# Patient Record
Sex: Female | Born: 1940 | Race: White | Hispanic: No | Smoking: Former smoker
Health system: Southern US, Community
[De-identification: ages and names within clinical notes are randomized; demographics above are authoritative.]

## PROBLEM LIST (undated history)

## (undated) DIAGNOSIS — M419 Scoliosis, unspecified: Secondary | ICD-10-CM

## (undated) DIAGNOSIS — G629 Polyneuropathy, unspecified: Secondary | ICD-10-CM

## (undated) DIAGNOSIS — Z9889 Other specified postprocedural states: Secondary | ICD-10-CM

## (undated) DIAGNOSIS — C73 Malignant neoplasm of thyroid gland: Secondary | ICD-10-CM

## (undated) DIAGNOSIS — F329 Major depressive disorder, single episode, unspecified: Secondary | ICD-10-CM

## (undated) DIAGNOSIS — IMO0001 Reserved for inherently not codable concepts without codable children: Secondary | ICD-10-CM

## (undated) DIAGNOSIS — E785 Hyperlipidemia, unspecified: Secondary | ICD-10-CM

## (undated) DIAGNOSIS — C801 Malignant (primary) neoplasm, unspecified: Secondary | ICD-10-CM

## (undated) DIAGNOSIS — M810 Age-related osteoporosis without current pathological fracture: Secondary | ICD-10-CM

## (undated) DIAGNOSIS — F32A Depression, unspecified: Secondary | ICD-10-CM

## (undated) DIAGNOSIS — E039 Hypothyroidism, unspecified: Secondary | ICD-10-CM

## (undated) DIAGNOSIS — I10 Essential (primary) hypertension: Secondary | ICD-10-CM

## (undated) DIAGNOSIS — G43909 Migraine, unspecified, not intractable, without status migrainosus: Secondary | ICD-10-CM

## (undated) DIAGNOSIS — G4700 Insomnia, unspecified: Secondary | ICD-10-CM

## (undated) DIAGNOSIS — M1990 Unspecified osteoarthritis, unspecified site: Secondary | ICD-10-CM

## (undated) HISTORY — DX: Scoliosis, unspecified: M41.9

## (undated) HISTORY — PX: TONSILLECTOMY: SUR1361

## (undated) HISTORY — PX: HARDWARE REMOVAL: SHX979

## (undated) HISTORY — DX: Depression, unspecified: F32.A

## (undated) HISTORY — DX: Insomnia, unspecified: G47.00

## (undated) HISTORY — DX: Major depressive disorder, single episode, unspecified: F32.9

## (undated) HISTORY — PX: CARPAL TUNNEL RELEASE: SHX101

## (undated) HISTORY — PX: OTHER SURGICAL HISTORY: SHX169

## (undated) HISTORY — DX: Malignant (primary) neoplasm, unspecified: C80.1

## (undated) HISTORY — DX: Hypothyroidism, unspecified: E03.9

## (undated) HISTORY — PX: CHOLECYSTECTOMY: SHX55

## (undated) HISTORY — DX: Migraine, unspecified, not intractable, without status migrainosus: G43.909

## (undated) HISTORY — PX: CATARACT EXTRACTION: SUR2

## (undated) HISTORY — DX: Hyperlipidemia, unspecified: E78.5

## (undated) HISTORY — DX: Malignant neoplasm of thyroid gland: C73

## (undated) HISTORY — PX: THYROIDECTOMY: SHX17

## (undated) HISTORY — DX: Reserved for inherently not codable concepts without codable children: IMO0001

## (undated) HISTORY — DX: Age-related osteoporosis without current pathological fracture: M81.0

## (undated) HISTORY — DX: Polyneuropathy, unspecified: G62.9

---

## 1968-04-13 DIAGNOSIS — C73 Malignant neoplasm of thyroid gland: Secondary | ICD-10-CM

## 1968-04-13 HISTORY — DX: Malignant neoplasm of thyroid gland: C73

## 2010-02-24 ENCOUNTER — Encounter: Admission: RE | Admit: 2010-02-24 | Discharge: 2010-02-24 | Payer: Self-pay | Admitting: Gastroenterology

## 2010-04-18 ENCOUNTER — Other Ambulatory Visit
Admission: RE | Admit: 2010-04-18 | Discharge: 2010-04-18 | Payer: Self-pay | Source: Home / Self Care | Admitting: Family Medicine

## 2010-10-19 ENCOUNTER — Observation Stay (HOSPITAL_COMMUNITY)
Admission: EM | Admit: 2010-10-19 | Discharge: 2010-10-20 | Disposition: A | Discharge status: Home / Self Care | Payer: Medicare Other | Attending: Internal Medicine | Admitting: Internal Medicine

## 2010-10-19 ENCOUNTER — Inpatient Hospital Stay (HOSPITAL_COMMUNITY): Payer: Medicare Other

## 2010-10-19 DIAGNOSIS — E039 Hypothyroidism, unspecified: Secondary | ICD-10-CM | POA: Insufficient documentation

## 2010-10-19 DIAGNOSIS — K589 Irritable bowel syndrome without diarrhea: Secondary | ICD-10-CM | POA: Insufficient documentation

## 2010-10-19 DIAGNOSIS — E860 Dehydration: Principal | ICD-10-CM | POA: Insufficient documentation

## 2010-10-19 DIAGNOSIS — W19XXXA Unspecified fall, initial encounter: Secondary | ICD-10-CM | POA: Insufficient documentation

## 2010-10-19 DIAGNOSIS — E785 Hyperlipidemia, unspecified: Secondary | ICD-10-CM | POA: Insufficient documentation

## 2010-10-19 DIAGNOSIS — N179 Acute kidney failure, unspecified: Secondary | ICD-10-CM | POA: Insufficient documentation

## 2010-10-19 DIAGNOSIS — Y9229 Other specified public building as the place of occurrence of the external cause: Secondary | ICD-10-CM | POA: Insufficient documentation

## 2010-10-19 DIAGNOSIS — R42 Dizziness and giddiness: Secondary | ICD-10-CM | POA: Insufficient documentation

## 2010-10-19 DIAGNOSIS — S01501A Unspecified open wound of lip, initial encounter: Secondary | ICD-10-CM | POA: Insufficient documentation

## 2010-10-19 DIAGNOSIS — R110 Nausea: Secondary | ICD-10-CM | POA: Insufficient documentation

## 2010-10-19 DIAGNOSIS — R55 Syncope and collapse: Secondary | ICD-10-CM | POA: Insufficient documentation

## 2010-10-19 DIAGNOSIS — R197 Diarrhea, unspecified: Secondary | ICD-10-CM | POA: Insufficient documentation

## 2010-10-19 DIAGNOSIS — Z79899 Other long term (current) drug therapy: Secondary | ICD-10-CM | POA: Insufficient documentation

## 2010-10-19 LAB — POCT I-STAT, CHEM 8
BUN: 27 mg/dL — ABNORMAL HIGH (ref 6–23)
Calcium, Ion: 1.27 mmol/L (ref 1.12–1.32)
Chloride: 108 meq/L (ref 96–112)
Creatinine, Ser: 1.4 mg/dL — ABNORMAL HIGH (ref 0.50–1.10)
Glucose, Bld: 97 mg/dL (ref 70–99)
HCT: 41 % (ref 36.0–46.0)
Hemoglobin: 13.9 g/dL (ref 12.0–15.0)
Potassium: 3.7 meq/L (ref 3.5–5.1)
Sodium: 142 meq/L (ref 135–145)
TCO2: 23 mmol/L (ref 0–100)

## 2010-10-19 LAB — CK TOTAL AND CKMB (NOT AT ARMC)
CK, MB: 1.8 ng/mL (ref 0.3–4.0)
Relative Index: INVALID (ref 0.0–2.5)
Total CK: 61 U/L (ref 7–177)

## 2010-10-19 LAB — TROPONIN I: Troponin I: <0.3 ng/mL

## 2010-10-20 ENCOUNTER — Inpatient Hospital Stay (HOSPITAL_COMMUNITY): Payer: Medicare Other

## 2010-10-20 DIAGNOSIS — R55 Syncope and collapse: Secondary | ICD-10-CM

## 2010-10-20 LAB — BASIC METABOLIC PANEL
CO2: 24 mEq/L (ref 19–32)
Chloride: 110 mEq/L (ref 96–112)
Glucose, Bld: 92 mg/dL (ref 70–99)
Sodium: 141 mEq/L (ref 135–145)

## 2010-10-20 LAB — CARDIAC PANEL(CRET KIN+CKTOT+MB+TROPI)
CK, MB: 2.1 ng/mL (ref 0.3–4.0)
Relative Index: INVALID (ref 0.0–2.5)
Relative Index: INVALID (ref 0.0–2.5)
Total CK: 68 U/L (ref 7–177)
Total CK: 72 U/L (ref 7–177)

## 2010-10-20 LAB — CBC
Hemoglobin: 11.8 g/dL — ABNORMAL LOW (ref 12.0–15.0)
MCH: 31 pg (ref 26.0–34.0)
MCHC: 34 g/dL (ref 30.0–36.0)
MCV: 91.1 fL (ref 78.0–100.0)
Platelets: 167 10*3/uL (ref 150–400)
RBC: 3.81 MIL/uL — ABNORMAL LOW (ref 3.87–5.11)

## 2010-10-20 MED ORDER — IOHEXOL 300 MG/ML  SOLN
100.0000 mL | Freq: Once | INTRAMUSCULAR | Status: AC | PRN
  Administered 2010-10-20: 100 mL via INTRAVENOUS

## 2010-10-21 LAB — FECAL LACTOFERRIN, QUANT: Fecal Lactoferrin: NEGATIVE

## 2010-10-21 LAB — GIARDIA/CRYPTOSPORIDIUM SCREEN(EIA)

## 2010-10-21 LAB — GRAM STAIN

## 2010-10-29 NOTE — Discharge Summary (Signed)
Jacqueline Nash, Jacqueline Nash             ACCOUNT NO.:  [id number]  MEDICAL RECORD NO.:  [id number]  LOCATION:  4709                         FACILITY:  [facility]  PHYSICIAN:  Thad Ranger, MD       DATE OF BIRTH:  1941-02-13  DATE OF ADMISSION:  10/19/2010 DATE OF DISCHARGE:  10/20/2010                        DISCHARGE SUMMARY - REFERRING   PRIMARY CARE PHYSICIAN:  Ancil Boozer, MD  DISCHARGE DIAGNOSES: 1. Syncopal episode, most likely precipitated due to dehydration from     diarrhea. 2. Acute kidney injury due to dehydration. 3. Diarrhea, resolved. 4. Hypothyroidism. 5. Positive D-dimer, CTA negative for pulmonary embolism.  DISCHARGE MEDICATIONS: 1. Pravachol 10 mg p.o. nightly. 2. Mobic 7.5 mg p.o. daily. 3. Synthroid 75 mcg p.o. q.a.m. 4. Lyrica 300 mg nightly. 5. Niacin 250 mg q.a.m. 6. Zoloft 100 mg q.a.m.  BRIEF HISTORY OF PRESENT ILLNESS AT THE TIME OF ADMISSION:  Jacqueline Nash is a 70 year old female who presented to the [facility] Emergency Room with a syncopal episode at the church on the day of admission.  The patient had stated that she was at the church on the morning and was standing in the church most of the time and around 11 a.m. she started having blurry vision and felt nauseous.  Denied any vomiting, however, had dizziness, lightheadedness, and then syncopal episode.  The patient had no recollection of the events during the syncopal episode.  She did hit the floor and has a laceration in the lower left lip for which she received stitches in the emergency room as well as right knee abrasion.  Also, the patient gave a history of irritable bowel syndrome and has off and on diarrhea for the last 1 week.  The patient had loose bowel movements most of the week.  She follows Dr. Randa Evens for her irritable bowel syndrome from the Gastroenterology.  The patient was admitted for observation due to borderline hypotension with lowest blood pressure of 85/50 in  the emergency room.  RADIOLOGICAL DATA:  CT of head without contrast on October 19, 2010, atrophy with no acute intracranial abnormality.  CT angio of the chest, no pulmonary embolus, 2 scattered tiny pulmonary nodules.  Followup chest CT, no pneumonia.  The pulmonary nodules are known to the patient. Patchy bilateral ground glass, my be due to edema.  Low-attenuation lesion in the pancreatic tail with possible enlargement from February 24, 2010.  Follow up in 6-12 months with MRI of abdomen with and without contrast preferred or CT without and with contrast.  Echo, EF of 55-65%, normal wall motion, no regional wall motion abnormality.  PERTINENT LABORATORY AND DIAGNOSTIC DATA:  Sodium at the time of admission 142, potassium 3.7, BUN 27, and creatinine 1.4.  Troponins remained negative for any acute ACS.  D-dimer positive at 1.65.  TSH 1.2.  Stools studies showed no WBCs.  Gram stain and culture was negative.  BRIEF HOSPITALIZATION COURSE:  Jacqueline Nash is a 70 year old female who was admitted with syncopal episode and hypotension. 1. Syncope, most likely perhaps due to dehydration from her ongoing     diarrhea for the last 1 week.  The patient was admitted for     observation to the  tele floor given the hypotension in the     emergency room.  She was hydrated with IV fluids.  She did have     mild acute kidney injury with the creatinine of 1.4 at the time of     admission.  Two-D echo was negative for any regional wall motion     abnormalities.  D-dimer was elevated, hence a CT angiogram of the     chest was obtained which was negative for PE.  CT of head was     negative for any stroke. 2. Acute kidney injury with unknown baseline creatinine, likely due to     dehydration from ongoing diarrhea.  The patient was gently hydrated     and creatinine function normalized at the time of the discharge. 3. Diarrhea, likely secondary to irritable bowel syndrome.  The     patient had no  diarrhea during the hospitalization.  Stool studies     were negative.  She will follow up with the gastroenterologist, Dr.     Randa Evens. 4. Hypothyroidism.  The patient was continued on Synthroid.  She was     discharged to home on October 20, 2010.  DISCHARGE FOLLOWUP:  With Dr. Ancil Boozer within 7-10 days for hospital followup.  TIME SPENT:  30 minutes.     Thad Ranger, MD     RR/MEDQ  D:  10/21/2010  T:  10/21/2010  Job:  [id number]  cc:   Ancil Boozer, MD [facility] Malon Kindle., M.D.  Electronically Signed by Andres Labrum Zaire Vanbuskirk  on 10/29/2010 05:46:46 PM

## 2010-10-29 NOTE — H&P (Signed)
NAMEMarland Kitchen  Nash, Jacqueline NO.:  [id number]  MEDICAL RECORD NO.:  [id number]  LOCATION:  [facility]                         FACILITY:  [facility]  PHYSICIAN:  Thad Ranger, MD       DATE OF BIRTH:  1940/11/24  DATE OF ADMISSION:  10/19/2010 DATE OF DISCHARGE:                             HISTORY & PHYSICAL   PRIMARY CARE PHYSICIAN:  Ancil Boozer, MD, [facility].  CHIEF COMPLAINT:  Syncopal episode.  HISTORY OF PRESENT ILLNESS:  Jacqueline Nash is a 70 year old female with arthritis, history of hyperlipidemia, hypothyroidism, irritable bowel syndrome who presented to the [facility] Emergency Room with a syncopal episode today at the church.  History was provided by the patient, who stated that she was at the church this morning and she was standing in the church most of the time.  Around 11 a.m., she started having blurry vision and then felt nauseous.  She did not have any vomiting, however, she did have dizziness and lightheadedness and then syncopal episode. The patient had no recollection of the event during the syncopal episode.  She did hit the floor and had a laceration below her lower lip, for which she received stitches in the emergency room as well as right knee abrasion.  The patient states that she did not have any prior syncopal episodes and no chest pain or any palpitations, shortness of breath, or any seizure-like activity witnessed by other people in the church.  The patient has a history of irritable bowel syndrome and states that she has off and on diarrhea for last 1 week.  The patient has been having loose bowel movement most of the week.  She follows Dr. Randa Evens for her irritable bowel syndrome from [facility].  In the emergency room, the patient was noticed to have borderline hypotension with the lowest blood pressure of 85/50, however, the patient had lowest recordable blood pressure in 60s via the EMS.   The patient has received about 1-1/2 L in the emergency room.  She is admitted under observation for further workup.  REVIEW OF SYSTEMS:  Pertinent positives are dictated above.  PAST MEDICAL HISTORY: 1. Arthritis. 2. Chronic back pain. 3. Hyperlipidemia. 4. Hypothyroidism. 5. Irritable bowel syndrome. 6. History of scoliosis.  SOCIAL HISTORY:  The patient denies any smoking, alcohol, or any drug use.  She currently lives at home and is functional with all her ADLs.  ALLERGIES:  DEMEROL AND SULFA DRUGS.  PAST SURGICAL HISTORY:  Thyroidectomy.  MEDICATIONS:  Prior to admission, 1. Zoloft 100 mg q.a.m. 2. Lyrica 300 mg at bedtime. 3. Mobic 7.5 mg daily. 4. Synthroid 75 mcg p.o. q.a.m.  PHYSICAL EXAMINATION:  VITAL SIGNS:  At the time of dictation, BP 93/65, pulse rate 55, respirations 20, temperature 98.2. GENERAL:  The patient is alert, awake and oriented, very pleasant and cooperative, not in any acute distress. HEENT:  Anicteric sclerae.  Pale conjunctivae.  Pupils reactive to light and accommodation.  EOMI.  Laceration below the lower lip, stitches placed. NECK:  Supple.  No lymphadenopathy.  No JVD.  Dry mucosal membranes. CVS:  S1 and S2 clear. CHEST:  Clear to auscultation bilaterally. ABDOMEN:  Soft,  nontender, nondistended.  Normal bowel sounds. EXTREMITIES:  No cyanosis, clubbing, or edema noted in upper or lower extremities bilaterally.  Abrasion on the right lower extremity with dressing intact. NEURO:  No focal neurological deficits noted.  LABORATORY DATA:  Sodium 142, potassium 3.7, BUN 27, creatinine 1.4, baseline creatinine unknown.  Hemoglobin 13.9, hematocrit 41.0, CK 61. Troponin less than 0.3.  RADIOLOGICAL DATA:  None available.  IMPRESSION AND PLAN:  Jacqueline Nash is a 70 year old female with stable medical problems including hyperlipidemia, hypothyroidism, irritable bowel syndrome, presented with syncopal episode today. 1. Syncope, most likely  precipitated due to dehydration from ongoing     diarrhea for last 1 week.  The patient will be admitted for     observation to the monitored floor given the hypotension.  She will     be gently hydrated with IV fluids.  Obtain cardiac enzymes to rule     out any ACS.  Obtain EKG, 2-D echo, and carotid Doppler for further     workup.  Given that the patient did fall on the floor hitting her     face, I would obtain a CT head.  Obtain D-dimer. 2. Acute kidney injury with unknown baseline creatinine.  Likely due     to dehydration.  The patient will be gently hydrated.  If     creatinine continues to worsen, will obtain renal ultrasound.  I     will hold Mobic for now. 3. Diarrhea, likely secondary to the irritable bowel syndrome.  The     patient denies taking any antibiotics in the last 3 months.  I will     place her on low-residue diet and also obtain stool studies for     further workup.  Currently, no indication for starting any     antibiotics. 4. Hypothyroidism.  Obtain TSH and continue Synthroid. 5. Arthritis.  Continue Lyrica and Tylenol p.r.n. 6. Prophylaxis.  Bilateral SCDs for DVT prophylaxis.     Thad Ranger, MD    RR/MEDQ  D:  10/19/2010  T:  10/19/2010  Job:  [id number]  cc:   Fayrene Fearing L. Malon Kindle., M.D. Ancil Boozer, MD  Electronically Signed by Andres Labrum RAI  on 10/29/2010 05:47:03 PM

## 2011-04-22 DIAGNOSIS — S239XXA Sprain of unspecified parts of thorax, initial encounter: Secondary | ICD-10-CM | POA: Diagnosis not present

## 2011-04-22 DIAGNOSIS — M47814 Spondylosis without myelopathy or radiculopathy, thoracic region: Secondary | ICD-10-CM | POA: Diagnosis not present

## 2011-04-22 DIAGNOSIS — M9981 Other biomechanical lesions of cervical region: Secondary | ICD-10-CM | POA: Diagnosis not present

## 2011-04-22 DIAGNOSIS — M999 Biomechanical lesion, unspecified: Secondary | ICD-10-CM | POA: Diagnosis not present

## 2011-04-22 DIAGNOSIS — S139XXA Sprain of joints and ligaments of unspecified parts of neck, initial encounter: Secondary | ICD-10-CM | POA: Diagnosis not present

## 2011-04-22 DIAGNOSIS — S335XXA Sprain of ligaments of lumbar spine, initial encounter: Secondary | ICD-10-CM | POA: Diagnosis not present

## 2011-04-23 DIAGNOSIS — Z0000 Encounter for general adult medical examination without abnormal findings: Secondary | ICD-10-CM | POA: Diagnosis not present

## 2011-04-23 DIAGNOSIS — E785 Hyperlipidemia, unspecified: Secondary | ICD-10-CM | POA: Diagnosis not present

## 2011-04-23 DIAGNOSIS — E039 Hypothyroidism, unspecified: Secondary | ICD-10-CM | POA: Diagnosis not present

## 2011-05-01 DIAGNOSIS — F321 Major depressive disorder, single episode, moderate: Secondary | ICD-10-CM | POA: Diagnosis not present

## 2011-06-02 ENCOUNTER — Other Ambulatory Visit: Payer: Self-pay | Admitting: Gastroenterology

## 2011-06-02 DIAGNOSIS — R932 Abnormal findings on diagnostic imaging of liver and biliary tract: Secondary | ICD-10-CM | POA: Diagnosis not present

## 2011-06-02 DIAGNOSIS — R197 Diarrhea, unspecified: Secondary | ICD-10-CM | POA: Diagnosis not present

## 2011-06-10 ENCOUNTER — Ambulatory Visit
Admission: RE | Admit: 2011-06-10 | Discharge: 2011-06-10 | Disposition: A | Discharge status: Home / Self Care | Payer: Medicare Other | Source: Ambulatory Visit | Attending: Gastroenterology | Admitting: Gastroenterology

## 2011-06-10 DIAGNOSIS — R197 Diarrhea, unspecified: Secondary | ICD-10-CM | POA: Diagnosis not present

## 2011-06-10 DIAGNOSIS — K863 Pseudocyst of pancreas: Secondary | ICD-10-CM | POA: Diagnosis not present

## 2011-06-10 DIAGNOSIS — K862 Cyst of pancreas: Secondary | ICD-10-CM | POA: Diagnosis not present

## 2011-06-10 DIAGNOSIS — R932 Abnormal findings on diagnostic imaging of liver and biliary tract: Secondary | ICD-10-CM

## 2011-06-10 DIAGNOSIS — K5900 Constipation, unspecified: Secondary | ICD-10-CM | POA: Diagnosis not present

## 2011-06-10 MED ORDER — GADOBENATE DIMEGLUMINE 529 MG/ML IV SOLN
13.0000 mL | Freq: Once | INTRAVENOUS | Status: AC | PRN
  Administered 2011-06-10: 13 mL via INTRAVENOUS

## 2011-06-18 DIAGNOSIS — E039 Hypothyroidism, unspecified: Secondary | ICD-10-CM | POA: Diagnosis not present

## 2011-07-21 DIAGNOSIS — M9981 Other biomechanical lesions of cervical region: Secondary | ICD-10-CM | POA: Diagnosis not present

## 2011-07-21 DIAGNOSIS — M47814 Spondylosis without myelopathy or radiculopathy, thoracic region: Secondary | ICD-10-CM | POA: Diagnosis not present

## 2011-07-21 DIAGNOSIS — S239XXA Sprain of unspecified parts of thorax, initial encounter: Secondary | ICD-10-CM | POA: Diagnosis not present

## 2011-07-21 DIAGNOSIS — S139XXA Sprain of joints and ligaments of unspecified parts of neck, initial encounter: Secondary | ICD-10-CM | POA: Diagnosis not present

## 2011-07-21 DIAGNOSIS — M999 Biomechanical lesion, unspecified: Secondary | ICD-10-CM | POA: Diagnosis not present

## 2011-08-07 DIAGNOSIS — H524 Presbyopia: Secondary | ICD-10-CM | POA: Diagnosis not present

## 2011-08-07 DIAGNOSIS — H43819 Vitreous degeneration, unspecified eye: Secondary | ICD-10-CM | POA: Diagnosis not present

## 2011-08-07 DIAGNOSIS — H538 Other visual disturbances: Secondary | ICD-10-CM | POA: Diagnosis not present

## 2011-08-07 DIAGNOSIS — H35379 Puckering of macula, unspecified eye: Secondary | ICD-10-CM | POA: Diagnosis not present

## 2011-08-07 DIAGNOSIS — H353190 Nonexudative age-related macular degeneration, unspecified eye, stage unspecified: Secondary | ICD-10-CM | POA: Diagnosis not present

## 2011-08-07 DIAGNOSIS — H2510 Age-related nuclear cataract, unspecified eye: Secondary | ICD-10-CM | POA: Diagnosis not present

## 2011-08-18 DIAGNOSIS — S139XXA Sprain of joints and ligaments of unspecified parts of neck, initial encounter: Secondary | ICD-10-CM | POA: Diagnosis not present

## 2011-08-18 DIAGNOSIS — S239XXA Sprain of unspecified parts of thorax, initial encounter: Secondary | ICD-10-CM | POA: Diagnosis not present

## 2011-08-18 DIAGNOSIS — M999 Biomechanical lesion, unspecified: Secondary | ICD-10-CM | POA: Diagnosis not present

## 2011-08-18 DIAGNOSIS — M47814 Spondylosis without myelopathy or radiculopathy, thoracic region: Secondary | ICD-10-CM | POA: Diagnosis not present

## 2011-08-18 DIAGNOSIS — M9981 Other biomechanical lesions of cervical region: Secondary | ICD-10-CM | POA: Diagnosis not present

## 2011-08-31 DIAGNOSIS — R932 Abnormal findings on diagnostic imaging of liver and biliary tract: Secondary | ICD-10-CM | POA: Diagnosis not present

## 2011-08-31 DIAGNOSIS — K589 Irritable bowel syndrome without diarrhea: Secondary | ICD-10-CM | POA: Diagnosis not present

## 2011-09-08 ENCOUNTER — Other Ambulatory Visit: Payer: Self-pay | Admitting: Family Medicine

## 2011-09-08 ENCOUNTER — Ambulatory Visit
Admission: RE | Admit: 2011-09-08 | Discharge: 2011-09-08 | Disposition: A | Discharge status: Home / Self Care | Payer: Medicare Other | Source: Ambulatory Visit | Attending: Family Medicine | Admitting: Family Medicine

## 2011-09-08 DIAGNOSIS — G609 Hereditary and idiopathic neuropathy, unspecified: Secondary | ICD-10-CM | POA: Diagnosis not present

## 2011-09-08 DIAGNOSIS — M19079 Primary osteoarthritis, unspecified ankle and foot: Secondary | ICD-10-CM | POA: Diagnosis not present

## 2011-09-08 DIAGNOSIS — M79609 Pain in unspecified limb: Secondary | ICD-10-CM

## 2011-09-08 DIAGNOSIS — G43909 Migraine, unspecified, not intractable, without status migrainosus: Secondary | ICD-10-CM | POA: Diagnosis not present

## 2011-09-08 DIAGNOSIS — E039 Hypothyroidism, unspecified: Secondary | ICD-10-CM | POA: Diagnosis not present

## 2011-09-08 DIAGNOSIS — F3342 Major depressive disorder, recurrent, in full remission: Secondary | ICD-10-CM | POA: Diagnosis not present

## 2011-09-08 DIAGNOSIS — J984 Other disorders of lung: Secondary | ICD-10-CM | POA: Diagnosis not present

## 2011-09-10 ENCOUNTER — Other Ambulatory Visit: Payer: Self-pay | Admitting: Family Medicine

## 2011-09-10 DIAGNOSIS — R911 Solitary pulmonary nodule: Secondary | ICD-10-CM

## 2011-09-11 DIAGNOSIS — L259 Unspecified contact dermatitis, unspecified cause: Secondary | ICD-10-CM | POA: Diagnosis not present

## 2011-09-11 DIAGNOSIS — L578 Other skin changes due to chronic exposure to nonionizing radiation: Secondary | ICD-10-CM | POA: Diagnosis not present

## 2011-09-15 DIAGNOSIS — H269 Unspecified cataract: Secondary | ICD-10-CM | POA: Diagnosis not present

## 2011-09-15 DIAGNOSIS — H2510 Age-related nuclear cataract, unspecified eye: Secondary | ICD-10-CM | POA: Diagnosis not present

## 2011-09-21 DIAGNOSIS — M79609 Pain in unspecified limb: Secondary | ICD-10-CM | POA: Diagnosis not present

## 2011-09-22 DIAGNOSIS — M47814 Spondylosis without myelopathy or radiculopathy, thoracic region: Secondary | ICD-10-CM | POA: Diagnosis not present

## 2011-09-22 DIAGNOSIS — S239XXA Sprain of unspecified parts of thorax, initial encounter: Secondary | ICD-10-CM | POA: Diagnosis not present

## 2011-09-22 DIAGNOSIS — S139XXA Sprain of joints and ligaments of unspecified parts of neck, initial encounter: Secondary | ICD-10-CM | POA: Diagnosis not present

## 2011-09-22 DIAGNOSIS — M999 Biomechanical lesion, unspecified: Secondary | ICD-10-CM | POA: Diagnosis not present

## 2011-09-22 DIAGNOSIS — M9981 Other biomechanical lesions of cervical region: Secondary | ICD-10-CM | POA: Diagnosis not present

## 2011-09-24 DIAGNOSIS — M79609 Pain in unspecified limb: Secondary | ICD-10-CM | POA: Diagnosis not present

## 2011-09-29 DIAGNOSIS — M79609 Pain in unspecified limb: Secondary | ICD-10-CM | POA: Diagnosis not present

## 2011-10-07 DIAGNOSIS — M79609 Pain in unspecified limb: Secondary | ICD-10-CM | POA: Diagnosis not present

## 2011-10-08 DIAGNOSIS — M79609 Pain in unspecified limb: Secondary | ICD-10-CM | POA: Diagnosis not present

## 2011-10-12 DIAGNOSIS — M79609 Pain in unspecified limb: Secondary | ICD-10-CM | POA: Diagnosis not present

## 2011-10-12 DIAGNOSIS — H2510 Age-related nuclear cataract, unspecified eye: Secondary | ICD-10-CM | POA: Diagnosis not present

## 2011-10-12 DIAGNOSIS — H35379 Puckering of macula, unspecified eye: Secondary | ICD-10-CM | POA: Diagnosis not present

## 2011-10-13 DIAGNOSIS — M79609 Pain in unspecified limb: Secondary | ICD-10-CM | POA: Diagnosis not present

## 2011-10-19 DIAGNOSIS — F321 Major depressive disorder, single episode, moderate: Secondary | ICD-10-CM | POA: Diagnosis not present

## 2011-10-19 DIAGNOSIS — M79609 Pain in unspecified limb: Secondary | ICD-10-CM | POA: Diagnosis not present

## 2011-10-20 DIAGNOSIS — M9981 Other biomechanical lesions of cervical region: Secondary | ICD-10-CM | POA: Diagnosis not present

## 2011-10-20 DIAGNOSIS — S239XXA Sprain of unspecified parts of thorax, initial encounter: Secondary | ICD-10-CM | POA: Diagnosis not present

## 2011-10-20 DIAGNOSIS — M47814 Spondylosis without myelopathy or radiculopathy, thoracic region: Secondary | ICD-10-CM | POA: Diagnosis not present

## 2011-10-20 DIAGNOSIS — S139XXA Sprain of joints and ligaments of unspecified parts of neck, initial encounter: Secondary | ICD-10-CM | POA: Diagnosis not present

## 2011-10-20 DIAGNOSIS — M999 Biomechanical lesion, unspecified: Secondary | ICD-10-CM | POA: Diagnosis not present

## 2011-10-21 DIAGNOSIS — M79609 Pain in unspecified limb: Secondary | ICD-10-CM | POA: Diagnosis not present

## 2011-10-22 ENCOUNTER — Ambulatory Visit
Admission: RE | Admit: 2011-10-22 | Discharge: 2011-10-22 | Disposition: A | Discharge status: Home / Self Care | Payer: Medicare Other | Source: Ambulatory Visit | Attending: Family Medicine | Admitting: Family Medicine

## 2011-10-22 DIAGNOSIS — R918 Other nonspecific abnormal finding of lung field: Secondary | ICD-10-CM | POA: Diagnosis not present

## 2011-10-22 DIAGNOSIS — R911 Solitary pulmonary nodule: Secondary | ICD-10-CM

## 2011-10-23 DIAGNOSIS — M79609 Pain in unspecified limb: Secondary | ICD-10-CM | POA: Diagnosis not present

## 2011-10-26 DIAGNOSIS — M79609 Pain in unspecified limb: Secondary | ICD-10-CM | POA: Diagnosis not present

## 2011-10-28 DIAGNOSIS — M79609 Pain in unspecified limb: Secondary | ICD-10-CM | POA: Diagnosis not present

## 2011-10-30 DIAGNOSIS — M79609 Pain in unspecified limb: Secondary | ICD-10-CM | POA: Diagnosis not present

## 2011-11-23 DIAGNOSIS — M999 Biomechanical lesion, unspecified: Secondary | ICD-10-CM | POA: Diagnosis not present

## 2011-11-23 DIAGNOSIS — M47814 Spondylosis without myelopathy or radiculopathy, thoracic region: Secondary | ICD-10-CM | POA: Diagnosis not present

## 2011-11-23 DIAGNOSIS — S139XXA Sprain of joints and ligaments of unspecified parts of neck, initial encounter: Secondary | ICD-10-CM | POA: Diagnosis not present

## 2011-11-23 DIAGNOSIS — S239XXA Sprain of unspecified parts of thorax, initial encounter: Secondary | ICD-10-CM | POA: Diagnosis not present

## 2011-11-23 DIAGNOSIS — M9981 Other biomechanical lesions of cervical region: Secondary | ICD-10-CM | POA: Diagnosis not present

## 2011-11-24 DIAGNOSIS — H269 Unspecified cataract: Secondary | ICD-10-CM | POA: Diagnosis not present

## 2011-11-24 DIAGNOSIS — H2510 Age-related nuclear cataract, unspecified eye: Secondary | ICD-10-CM | POA: Diagnosis not present

## 2011-12-21 DIAGNOSIS — S239XXA Sprain of unspecified parts of thorax, initial encounter: Secondary | ICD-10-CM | POA: Diagnosis not present

## 2011-12-21 DIAGNOSIS — M47814 Spondylosis without myelopathy or radiculopathy, thoracic region: Secondary | ICD-10-CM | POA: Diagnosis not present

## 2011-12-21 DIAGNOSIS — M999 Biomechanical lesion, unspecified: Secondary | ICD-10-CM | POA: Diagnosis not present

## 2011-12-21 DIAGNOSIS — M9981 Other biomechanical lesions of cervical region: Secondary | ICD-10-CM | POA: Diagnosis not present

## 2011-12-21 DIAGNOSIS — S139XXA Sprain of joints and ligaments of unspecified parts of neck, initial encounter: Secondary | ICD-10-CM | POA: Diagnosis not present

## 2012-01-18 DIAGNOSIS — S139XXA Sprain of joints and ligaments of unspecified parts of neck, initial encounter: Secondary | ICD-10-CM | POA: Diagnosis not present

## 2012-01-18 DIAGNOSIS — M47814 Spondylosis without myelopathy or radiculopathy, thoracic region: Secondary | ICD-10-CM | POA: Diagnosis not present

## 2012-01-18 DIAGNOSIS — M9981 Other biomechanical lesions of cervical region: Secondary | ICD-10-CM | POA: Diagnosis not present

## 2012-01-18 DIAGNOSIS — S239XXA Sprain of unspecified parts of thorax, initial encounter: Secondary | ICD-10-CM | POA: Diagnosis not present

## 2012-01-18 DIAGNOSIS — M999 Biomechanical lesion, unspecified: Secondary | ICD-10-CM | POA: Diagnosis not present

## 2012-02-22 DIAGNOSIS — S239XXA Sprain of unspecified parts of thorax, initial encounter: Secondary | ICD-10-CM | POA: Diagnosis not present

## 2012-02-22 DIAGNOSIS — M47814 Spondylosis without myelopathy or radiculopathy, thoracic region: Secondary | ICD-10-CM | POA: Diagnosis not present

## 2012-02-22 DIAGNOSIS — M9981 Other biomechanical lesions of cervical region: Secondary | ICD-10-CM | POA: Diagnosis not present

## 2012-02-22 DIAGNOSIS — M999 Biomechanical lesion, unspecified: Secondary | ICD-10-CM | POA: Diagnosis not present

## 2012-02-22 DIAGNOSIS — S139XXA Sprain of joints and ligaments of unspecified parts of neck, initial encounter: Secondary | ICD-10-CM | POA: Diagnosis not present

## 2012-02-29 DIAGNOSIS — Z23 Encounter for immunization: Secondary | ICD-10-CM | POA: Diagnosis not present

## 2012-03-21 DIAGNOSIS — S139XXA Sprain of joints and ligaments of unspecified parts of neck, initial encounter: Secondary | ICD-10-CM | POA: Diagnosis not present

## 2012-03-21 DIAGNOSIS — M47814 Spondylosis without myelopathy or radiculopathy, thoracic region: Secondary | ICD-10-CM | POA: Diagnosis not present

## 2012-03-21 DIAGNOSIS — M999 Biomechanical lesion, unspecified: Secondary | ICD-10-CM | POA: Diagnosis not present

## 2012-03-21 DIAGNOSIS — S239XXA Sprain of unspecified parts of thorax, initial encounter: Secondary | ICD-10-CM | POA: Diagnosis not present

## 2012-03-21 DIAGNOSIS — M9981 Other biomechanical lesions of cervical region: Secondary | ICD-10-CM | POA: Diagnosis not present

## 2012-04-18 DIAGNOSIS — S139XXA Sprain of joints and ligaments of unspecified parts of neck, initial encounter: Secondary | ICD-10-CM | POA: Diagnosis not present

## 2012-04-18 DIAGNOSIS — M999 Biomechanical lesion, unspecified: Secondary | ICD-10-CM | POA: Diagnosis not present

## 2012-04-18 DIAGNOSIS — S239XXA Sprain of unspecified parts of thorax, initial encounter: Secondary | ICD-10-CM | POA: Diagnosis not present

## 2012-04-18 DIAGNOSIS — M47814 Spondylosis without myelopathy or radiculopathy, thoracic region: Secondary | ICD-10-CM | POA: Diagnosis not present

## 2012-04-18 DIAGNOSIS — M9981 Other biomechanical lesions of cervical region: Secondary | ICD-10-CM | POA: Diagnosis not present

## 2012-04-28 DIAGNOSIS — Z1231 Encounter for screening mammogram for malignant neoplasm of breast: Secondary | ICD-10-CM | POA: Diagnosis not present

## 2012-05-10 DIAGNOSIS — E039 Hypothyroidism, unspecified: Secondary | ICD-10-CM | POA: Diagnosis not present

## 2012-05-10 DIAGNOSIS — E785 Hyperlipidemia, unspecified: Secondary | ICD-10-CM | POA: Diagnosis not present

## 2012-05-10 DIAGNOSIS — Z0000 Encounter for general adult medical examination without abnormal findings: Secondary | ICD-10-CM | POA: Diagnosis not present

## 2012-05-16 DIAGNOSIS — M9981 Other biomechanical lesions of cervical region: Secondary | ICD-10-CM | POA: Diagnosis not present

## 2012-05-16 DIAGNOSIS — M999 Biomechanical lesion, unspecified: Secondary | ICD-10-CM | POA: Diagnosis not present

## 2012-05-16 DIAGNOSIS — S139XXA Sprain of joints and ligaments of unspecified parts of neck, initial encounter: Secondary | ICD-10-CM | POA: Diagnosis not present

## 2012-05-16 DIAGNOSIS — S239XXA Sprain of unspecified parts of thorax, initial encounter: Secondary | ICD-10-CM | POA: Diagnosis not present

## 2012-05-16 DIAGNOSIS — M47814 Spondylosis without myelopathy or radiculopathy, thoracic region: Secondary | ICD-10-CM | POA: Diagnosis not present

## 2012-06-13 DIAGNOSIS — S139XXA Sprain of joints and ligaments of unspecified parts of neck, initial encounter: Secondary | ICD-10-CM | POA: Diagnosis not present

## 2012-06-13 DIAGNOSIS — S239XXA Sprain of unspecified parts of thorax, initial encounter: Secondary | ICD-10-CM | POA: Diagnosis not present

## 2012-06-13 DIAGNOSIS — M9981 Other biomechanical lesions of cervical region: Secondary | ICD-10-CM | POA: Diagnosis not present

## 2012-06-13 DIAGNOSIS — M999 Biomechanical lesion, unspecified: Secondary | ICD-10-CM | POA: Diagnosis not present

## 2012-06-13 DIAGNOSIS — M47814 Spondylosis without myelopathy or radiculopathy, thoracic region: Secondary | ICD-10-CM | POA: Diagnosis not present

## 2012-06-20 ENCOUNTER — Other Ambulatory Visit: Payer: Self-pay | Admitting: Gastroenterology

## 2012-06-20 DIAGNOSIS — K862 Cyst of pancreas: Secondary | ICD-10-CM

## 2012-06-29 DIAGNOSIS — R932 Abnormal findings on diagnostic imaging of liver and biliary tract: Secondary | ICD-10-CM | POA: Diagnosis not present

## 2012-06-29 DIAGNOSIS — E785 Hyperlipidemia, unspecified: Secondary | ICD-10-CM | POA: Diagnosis not present

## 2012-07-07 ENCOUNTER — Ambulatory Visit
Admission: RE | Admit: 2012-07-07 | Discharge: 2012-07-07 | Disposition: A | Discharge status: Home / Self Care | Payer: Medicare Other | Source: Ambulatory Visit | Attending: Gastroenterology | Admitting: Gastroenterology

## 2012-07-07 DIAGNOSIS — K8689 Other specified diseases of pancreas: Secondary | ICD-10-CM | POA: Diagnosis not present

## 2012-07-07 DIAGNOSIS — K862 Cyst of pancreas: Secondary | ICD-10-CM

## 2012-07-07 MED ORDER — GADOBENATE DIMEGLUMINE 529 MG/ML IV SOLN
13.0000 mL | Freq: Once | INTRAVENOUS | Status: AC | PRN
  Administered 2012-07-07: 13 mL via INTRAVENOUS

## 2012-07-11 DIAGNOSIS — S239XXA Sprain of unspecified parts of thorax, initial encounter: Secondary | ICD-10-CM | POA: Diagnosis not present

## 2012-07-11 DIAGNOSIS — M9981 Other biomechanical lesions of cervical region: Secondary | ICD-10-CM | POA: Diagnosis not present

## 2012-07-11 DIAGNOSIS — M999 Biomechanical lesion, unspecified: Secondary | ICD-10-CM | POA: Diagnosis not present

## 2012-07-11 DIAGNOSIS — M47814 Spondylosis without myelopathy or radiculopathy, thoracic region: Secondary | ICD-10-CM | POA: Diagnosis not present

## 2012-07-11 DIAGNOSIS — S139XXA Sprain of joints and ligaments of unspecified parts of neck, initial encounter: Secondary | ICD-10-CM | POA: Diagnosis not present

## 2012-07-13 DIAGNOSIS — H43399 Other vitreous opacities, unspecified eye: Secondary | ICD-10-CM | POA: Diagnosis not present

## 2012-07-13 DIAGNOSIS — Z961 Presence of intraocular lens: Secondary | ICD-10-CM | POA: Diagnosis not present

## 2012-07-13 DIAGNOSIS — H353190 Nonexudative age-related macular degeneration, unspecified eye, stage unspecified: Secondary | ICD-10-CM | POA: Diagnosis not present

## 2012-08-08 DIAGNOSIS — M9981 Other biomechanical lesions of cervical region: Secondary | ICD-10-CM | POA: Diagnosis not present

## 2012-08-08 DIAGNOSIS — M47814 Spondylosis without myelopathy or radiculopathy, thoracic region: Secondary | ICD-10-CM | POA: Diagnosis not present

## 2012-08-08 DIAGNOSIS — M999 Biomechanical lesion, unspecified: Secondary | ICD-10-CM | POA: Diagnosis not present

## 2012-08-08 DIAGNOSIS — S239XXA Sprain of unspecified parts of thorax, initial encounter: Secondary | ICD-10-CM | POA: Diagnosis not present

## 2012-08-08 DIAGNOSIS — S139XXA Sprain of joints and ligaments of unspecified parts of neck, initial encounter: Secondary | ICD-10-CM | POA: Diagnosis not present

## 2012-09-12 DIAGNOSIS — S239XXA Sprain of unspecified parts of thorax, initial encounter: Secondary | ICD-10-CM | POA: Diagnosis not present

## 2012-09-12 DIAGNOSIS — M9981 Other biomechanical lesions of cervical region: Secondary | ICD-10-CM | POA: Diagnosis not present

## 2012-09-12 DIAGNOSIS — M999 Biomechanical lesion, unspecified: Secondary | ICD-10-CM | POA: Diagnosis not present

## 2012-09-12 DIAGNOSIS — S335XXA Sprain of ligaments of lumbar spine, initial encounter: Secondary | ICD-10-CM | POA: Diagnosis not present

## 2012-09-12 DIAGNOSIS — S139XXA Sprain of joints and ligaments of unspecified parts of neck, initial encounter: Secondary | ICD-10-CM | POA: Diagnosis not present

## 2012-09-13 DIAGNOSIS — L299 Pruritus, unspecified: Secondary | ICD-10-CM | POA: Diagnosis not present

## 2012-09-13 DIAGNOSIS — L570 Actinic keratosis: Secondary | ICD-10-CM | POA: Diagnosis not present

## 2012-09-13 DIAGNOSIS — D235 Other benign neoplasm of skin of trunk: Secondary | ICD-10-CM | POA: Diagnosis not present

## 2012-10-17 DIAGNOSIS — S335XXA Sprain of ligaments of lumbar spine, initial encounter: Secondary | ICD-10-CM | POA: Diagnosis not present

## 2012-10-17 DIAGNOSIS — M999 Biomechanical lesion, unspecified: Secondary | ICD-10-CM | POA: Diagnosis not present

## 2012-10-17 DIAGNOSIS — M9981 Other biomechanical lesions of cervical region: Secondary | ICD-10-CM | POA: Diagnosis not present

## 2012-10-17 DIAGNOSIS — S139XXA Sprain of joints and ligaments of unspecified parts of neck, initial encounter: Secondary | ICD-10-CM | POA: Diagnosis not present

## 2012-10-17 DIAGNOSIS — S239XXA Sprain of unspecified parts of thorax, initial encounter: Secondary | ICD-10-CM | POA: Diagnosis not present

## 2012-11-09 DIAGNOSIS — E785 Hyperlipidemia, unspecified: Secondary | ICD-10-CM | POA: Diagnosis not present

## 2012-11-09 DIAGNOSIS — J984 Other disorders of lung: Secondary | ICD-10-CM | POA: Diagnosis not present

## 2012-11-09 DIAGNOSIS — K589 Irritable bowel syndrome without diarrhea: Secondary | ICD-10-CM | POA: Diagnosis not present

## 2012-11-09 DIAGNOSIS — E039 Hypothyroidism, unspecified: Secondary | ICD-10-CM | POA: Diagnosis not present

## 2012-11-10 ENCOUNTER — Other Ambulatory Visit: Payer: Self-pay

## 2012-11-10 DIAGNOSIS — R918 Other nonspecific abnormal finding of lung field: Secondary | ICD-10-CM

## 2012-11-14 DIAGNOSIS — M999 Biomechanical lesion, unspecified: Secondary | ICD-10-CM | POA: Diagnosis not present

## 2012-11-14 DIAGNOSIS — S335XXA Sprain of ligaments of lumbar spine, initial encounter: Secondary | ICD-10-CM | POA: Diagnosis not present

## 2012-11-14 DIAGNOSIS — M9981 Other biomechanical lesions of cervical region: Secondary | ICD-10-CM | POA: Diagnosis not present

## 2012-11-14 DIAGNOSIS — S139XXA Sprain of joints and ligaments of unspecified parts of neck, initial encounter: Secondary | ICD-10-CM | POA: Diagnosis not present

## 2012-11-14 DIAGNOSIS — S239XXA Sprain of unspecified parts of thorax, initial encounter: Secondary | ICD-10-CM | POA: Diagnosis not present

## 2012-11-17 ENCOUNTER — Ambulatory Visit
Admission: RE | Admit: 2012-11-17 | Discharge: 2012-11-17 | Disposition: A | Discharge status: Home / Self Care | Payer: Medicare Other | Source: Ambulatory Visit

## 2012-11-17 DIAGNOSIS — R918 Other nonspecific abnormal finding of lung field: Secondary | ICD-10-CM

## 2012-11-17 DIAGNOSIS — J984 Other disorders of lung: Secondary | ICD-10-CM | POA: Diagnosis not present

## 2012-12-13 DIAGNOSIS — M9981 Other biomechanical lesions of cervical region: Secondary | ICD-10-CM | POA: Diagnosis not present

## 2012-12-13 DIAGNOSIS — S239XXA Sprain of unspecified parts of thorax, initial encounter: Secondary | ICD-10-CM | POA: Diagnosis not present

## 2012-12-13 DIAGNOSIS — S335XXA Sprain of ligaments of lumbar spine, initial encounter: Secondary | ICD-10-CM | POA: Diagnosis not present

## 2012-12-13 DIAGNOSIS — S139XXA Sprain of joints and ligaments of unspecified parts of neck, initial encounter: Secondary | ICD-10-CM | POA: Diagnosis not present

## 2012-12-13 DIAGNOSIS — M999 Biomechanical lesion, unspecified: Secondary | ICD-10-CM | POA: Diagnosis not present

## 2013-01-09 DIAGNOSIS — M999 Biomechanical lesion, unspecified: Secondary | ICD-10-CM | POA: Diagnosis not present

## 2013-01-09 DIAGNOSIS — S335XXA Sprain of ligaments of lumbar spine, initial encounter: Secondary | ICD-10-CM | POA: Diagnosis not present

## 2013-01-09 DIAGNOSIS — S139XXA Sprain of joints and ligaments of unspecified parts of neck, initial encounter: Secondary | ICD-10-CM | POA: Diagnosis not present

## 2013-01-09 DIAGNOSIS — M9981 Other biomechanical lesions of cervical region: Secondary | ICD-10-CM | POA: Diagnosis not present

## 2013-01-09 DIAGNOSIS — S239XXA Sprain of unspecified parts of thorax, initial encounter: Secondary | ICD-10-CM | POA: Diagnosis not present

## 2013-02-13 DIAGNOSIS — S239XXA Sprain of unspecified parts of thorax, initial encounter: Secondary | ICD-10-CM | POA: Diagnosis not present

## 2013-02-13 DIAGNOSIS — S139XXA Sprain of joints and ligaments of unspecified parts of neck, initial encounter: Secondary | ICD-10-CM | POA: Diagnosis not present

## 2013-02-13 DIAGNOSIS — S335XXA Sprain of ligaments of lumbar spine, initial encounter: Secondary | ICD-10-CM | POA: Diagnosis not present

## 2013-02-13 DIAGNOSIS — M9981 Other biomechanical lesions of cervical region: Secondary | ICD-10-CM | POA: Diagnosis not present

## 2013-02-13 DIAGNOSIS — M999 Biomechanical lesion, unspecified: Secondary | ICD-10-CM | POA: Diagnosis not present

## 2013-02-16 DIAGNOSIS — Z23 Encounter for immunization: Secondary | ICD-10-CM | POA: Diagnosis not present

## 2013-02-28 IMAGING — CT CT CHEST W/O CM
3 of 4 series · 16 of 30 positions shown, 17 images · non-contrast
Comparison: 10/20/2010

CLINICAL DATA: Follow up pulmonary nodule

CT CHEST WITHOUT CONTRAST
TECHNIQUE: Multidetector CT imaging of the chest was performed
following the standard protocol without IV contrast.

[Series 3: chest w/o · axial · non-contrast · 0.70mm/px · z∈[-200,-35]mm · 4 of 56 slices shown, 5 images]
[im 12/56  mediastinal]
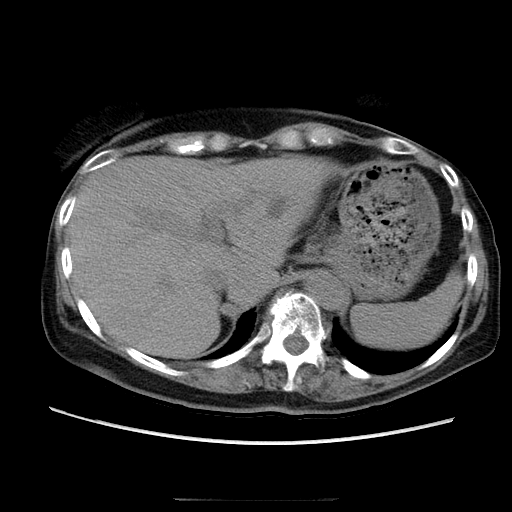
[im 12/56  lung]
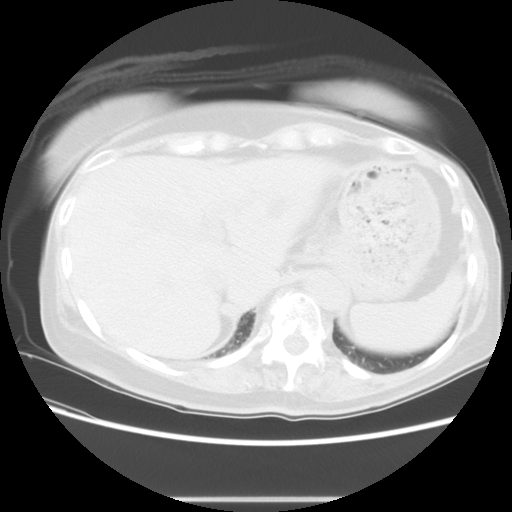
[im 23/56  lung]
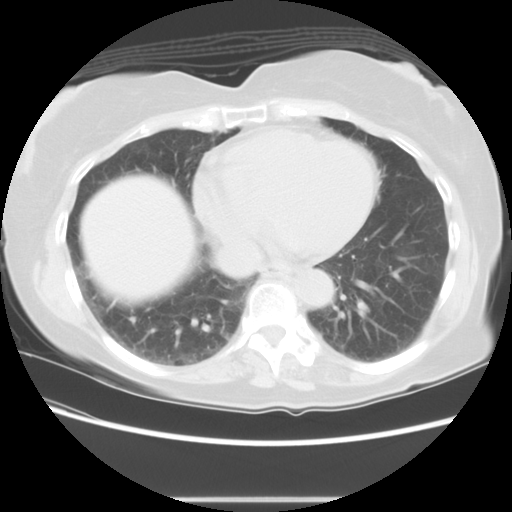
[im 34/56  lung]
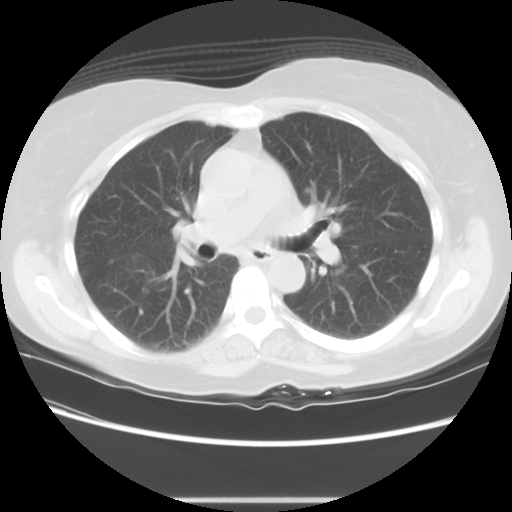
[im 45/56  lung]
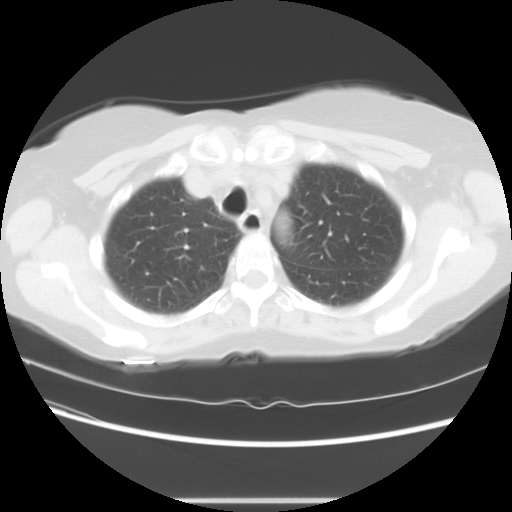

[Series 4: lung windows · axial · 0.70mm/px · z∈[-200,-35]mm · 4 of 56 slices shown]
[im 12/56  lung]
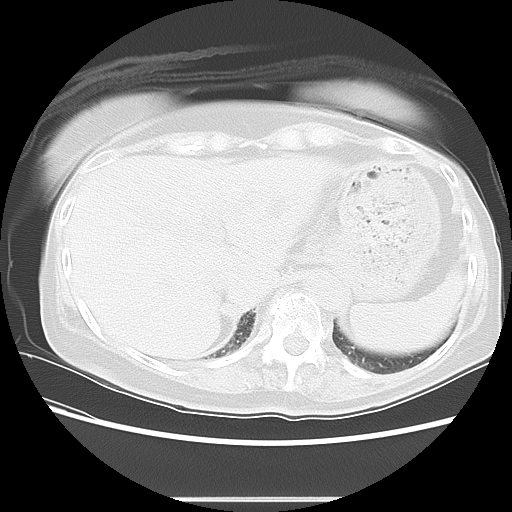
[im 23/56  lung]
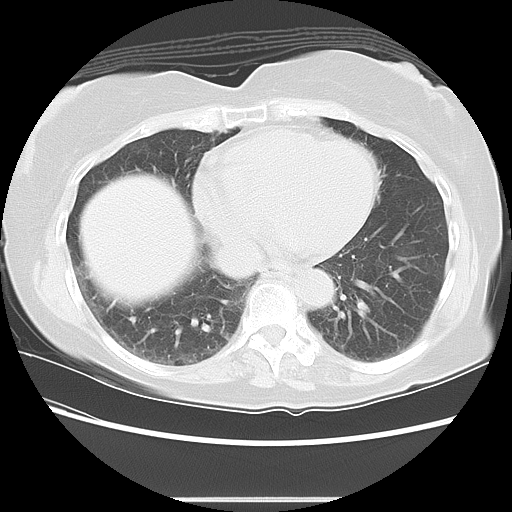
[im 34/56  lung]
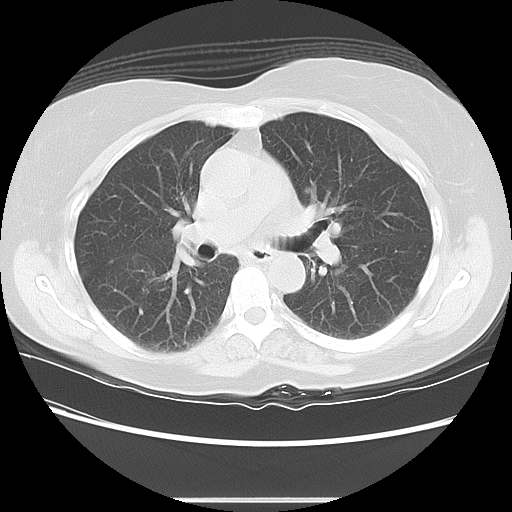
[im 45/56  lung]
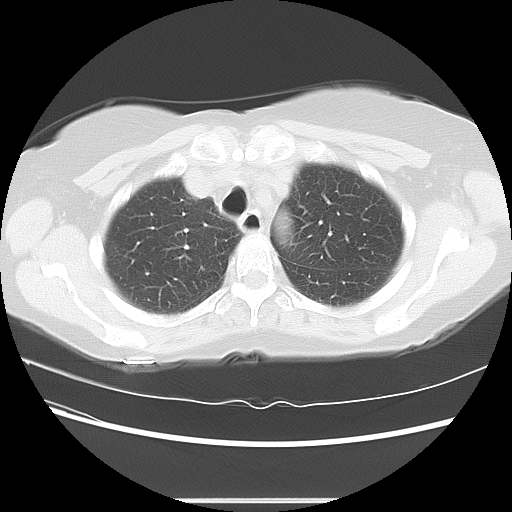

[Series 602: sagittal body · sagittal · 0.70mm/px · 8 of 145 slices shown]
[im 10/145  mediastinal]
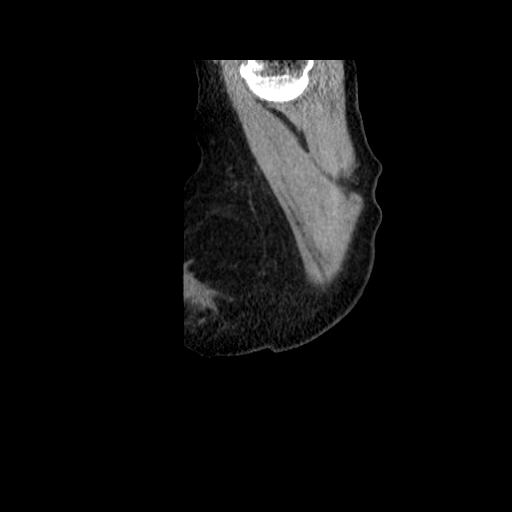
[im 29/145  mediastinal]
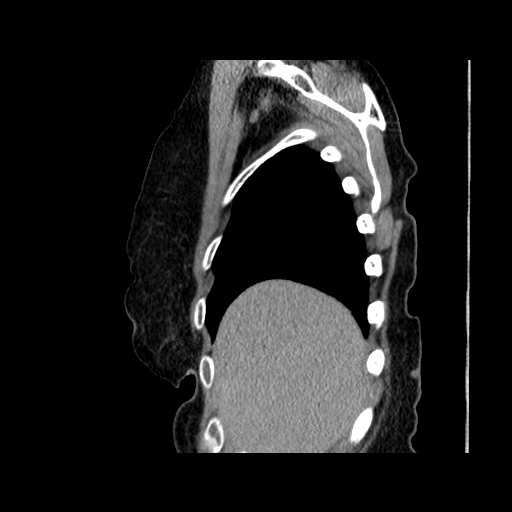
[im 49/145  mediastinal]
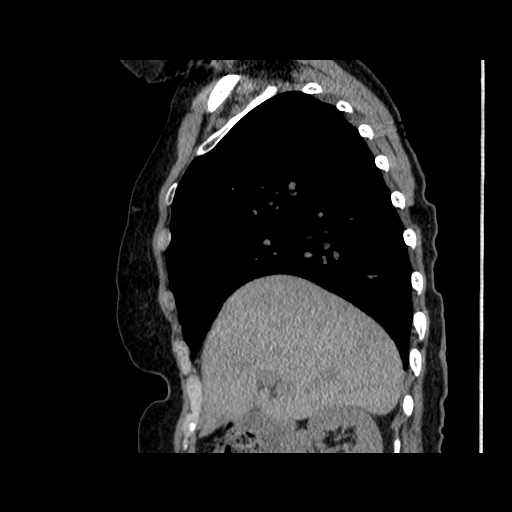
[im 68/145  mediastinal]
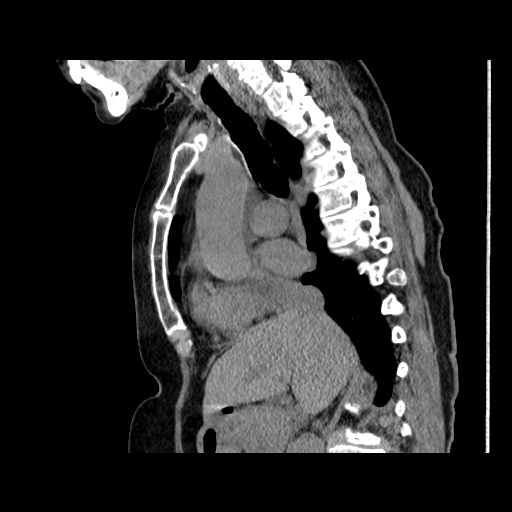
[im 77/145  mediastinal]
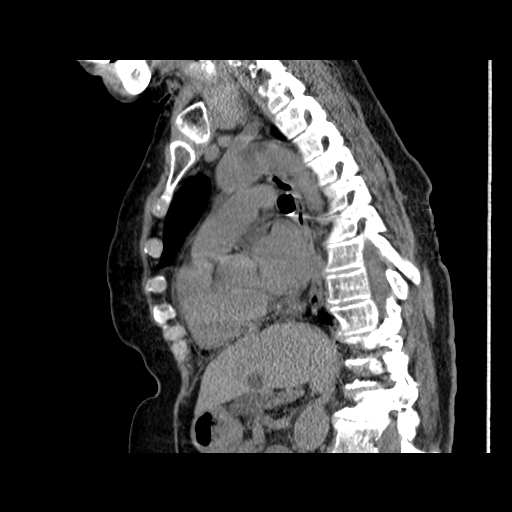
[im 97/145  mediastinal]
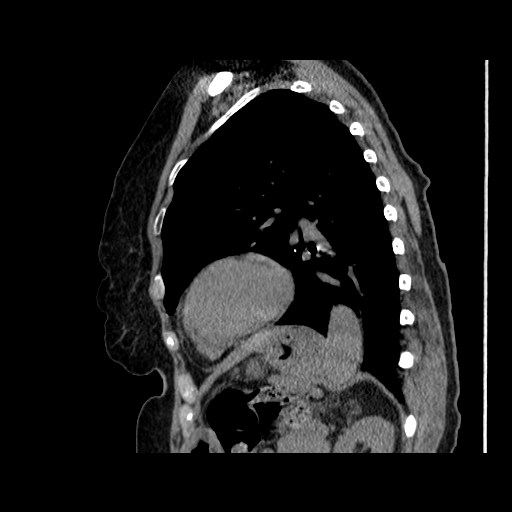
[im 116/145  mediastinal]
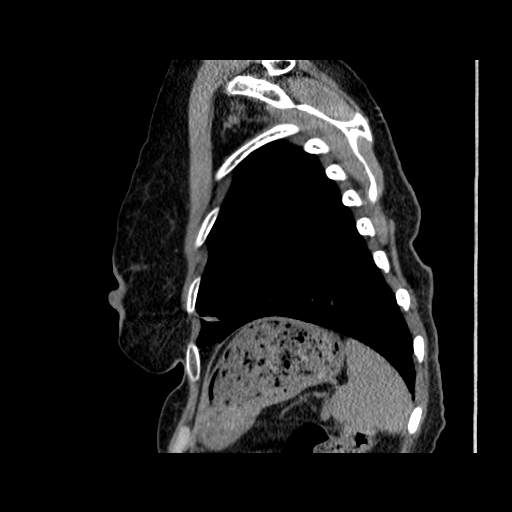
[im 135/145  mediastinal]
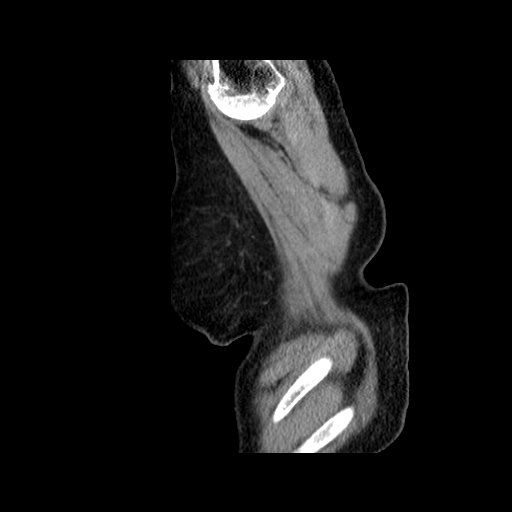

[16 of 30 positions shown; findings below may reference images not displayed]

FINDINGS: No axillary or supraclavicular adenopathy.  There is no mediastinal
or hilar adenopathy identified.

No pericardial or pleural effusion.  Calcifications are noted
involving the LAD coronary artery.

Index nodule in the left upper lobe is stable measuring 4.5 mm,
image 14.  Also in the left upper lobe is a 4.5 mm nodule, image
24.  Stable from prior exam.  Right upper lobe nodule is unchanged
measuring 5.4 mm, image 27.  No new or enlarging pulmonary nodules
or masses.

Review of the visualized osseous structures is significant for mild
thoracic spondylosis and scoliosis.

Within the left hepatic lobe there is a cyst measuring 1.8 cm.  The
adrenal glands are negative. Cystic structure along the ventral
surface of the neck of pancreas measures 2.4 cm, image 50.  This is
stable from previous exam.

Multiple splenules are identified within the left upper quadrant of
the abdomen.
IMPRESSION: 1.  No acute findings
2.  Pulmonary nodules in the lungs are unchanged from previous
study.  The largest measures 5.4 mm.  If the the patient is at
increased risk for bronchogenic carcinoma then the next follow-up
exam should be at 24 months.  If the patient is at a low risk in
the next follow-up exam should be at 18-24 months.
3.  Stable low attenuation within the ventral surface of the
pancreatic neck. Advise follow-up [HOSPITAL] 12 months.  At this
time the study of choice would be a contrast-enhanced MRI.

4.  Liver cysts.

## 2013-03-13 DIAGNOSIS — S335XXA Sprain of ligaments of lumbar spine, initial encounter: Secondary | ICD-10-CM | POA: Diagnosis not present

## 2013-03-13 DIAGNOSIS — M999 Biomechanical lesion, unspecified: Secondary | ICD-10-CM | POA: Diagnosis not present

## 2013-03-13 DIAGNOSIS — S239XXA Sprain of unspecified parts of thorax, initial encounter: Secondary | ICD-10-CM | POA: Diagnosis not present

## 2013-03-13 DIAGNOSIS — S139XXA Sprain of joints and ligaments of unspecified parts of neck, initial encounter: Secondary | ICD-10-CM | POA: Diagnosis not present

## 2013-03-13 DIAGNOSIS — M9981 Other biomechanical lesions of cervical region: Secondary | ICD-10-CM | POA: Diagnosis not present

## 2013-04-17 DIAGNOSIS — M999 Biomechanical lesion, unspecified: Secondary | ICD-10-CM | POA: Diagnosis not present

## 2013-04-17 DIAGNOSIS — M9981 Other biomechanical lesions of cervical region: Secondary | ICD-10-CM | POA: Diagnosis not present

## 2013-04-17 DIAGNOSIS — S335XXA Sprain of ligaments of lumbar spine, initial encounter: Secondary | ICD-10-CM | POA: Diagnosis not present

## 2013-04-17 DIAGNOSIS — S139XXA Sprain of joints and ligaments of unspecified parts of neck, initial encounter: Secondary | ICD-10-CM | POA: Diagnosis not present

## 2013-04-17 DIAGNOSIS — S239XXA Sprain of unspecified parts of thorax, initial encounter: Secondary | ICD-10-CM | POA: Diagnosis not present

## 2013-05-12 DIAGNOSIS — E785 Hyperlipidemia, unspecified: Secondary | ICD-10-CM | POA: Diagnosis not present

## 2013-05-12 DIAGNOSIS — Z23 Encounter for immunization: Secondary | ICD-10-CM | POA: Diagnosis not present

## 2013-05-12 DIAGNOSIS — M6530 Trigger finger, unspecified finger: Secondary | ICD-10-CM | POA: Diagnosis not present

## 2013-05-12 DIAGNOSIS — N951 Menopausal and female climacteric states: Secondary | ICD-10-CM | POA: Diagnosis not present

## 2013-05-12 DIAGNOSIS — R51 Headache: Secondary | ICD-10-CM | POA: Diagnosis not present

## 2013-05-12 DIAGNOSIS — R599 Enlarged lymph nodes, unspecified: Secondary | ICD-10-CM | POA: Diagnosis not present

## 2013-05-12 DIAGNOSIS — Z0000 Encounter for general adult medical examination without abnormal findings: Secondary | ICD-10-CM | POA: Diagnosis not present

## 2013-05-12 DIAGNOSIS — E039 Hypothyroidism, unspecified: Secondary | ICD-10-CM | POA: Diagnosis not present

## 2013-05-15 DIAGNOSIS — M999 Biomechanical lesion, unspecified: Secondary | ICD-10-CM | POA: Diagnosis not present

## 2013-05-15 DIAGNOSIS — S335XXA Sprain of ligaments of lumbar spine, initial encounter: Secondary | ICD-10-CM | POA: Diagnosis not present

## 2013-05-15 DIAGNOSIS — S239XXA Sprain of unspecified parts of thorax, initial encounter: Secondary | ICD-10-CM | POA: Diagnosis not present

## 2013-05-15 DIAGNOSIS — S139XXA Sprain of joints and ligaments of unspecified parts of neck, initial encounter: Secondary | ICD-10-CM | POA: Diagnosis not present

## 2013-05-15 DIAGNOSIS — M9981 Other biomechanical lesions of cervical region: Secondary | ICD-10-CM | POA: Diagnosis not present

## 2013-05-16 ENCOUNTER — Other Ambulatory Visit (HOSPITAL_COMMUNITY): Payer: Self-pay | Admitting: Family Medicine

## 2013-05-16 DIAGNOSIS — Z1231 Encounter for screening mammogram for malignant neoplasm of breast: Secondary | ICD-10-CM

## 2013-05-17 ENCOUNTER — Ambulatory Visit (HOSPITAL_COMMUNITY)
Admission: RE | Admit: 2013-05-17 | Discharge: 2013-05-17 | Disposition: A | Discharge status: Home / Self Care | Payer: Medicare Other | Source: Ambulatory Visit | Attending: Family Medicine | Admitting: Family Medicine

## 2013-05-17 DIAGNOSIS — Z1231 Encounter for screening mammogram for malignant neoplasm of breast: Secondary | ICD-10-CM | POA: Diagnosis not present

## 2013-06-07 DIAGNOSIS — Z780 Asymptomatic menopausal state: Secondary | ICD-10-CM | POA: Diagnosis not present

## 2013-06-12 DIAGNOSIS — S139XXA Sprain of joints and ligaments of unspecified parts of neck, initial encounter: Secondary | ICD-10-CM | POA: Diagnosis not present

## 2013-06-12 DIAGNOSIS — M9981 Other biomechanical lesions of cervical region: Secondary | ICD-10-CM | POA: Diagnosis not present

## 2013-06-12 DIAGNOSIS — S335XXA Sprain of ligaments of lumbar spine, initial encounter: Secondary | ICD-10-CM | POA: Diagnosis not present

## 2013-06-12 DIAGNOSIS — M999 Biomechanical lesion, unspecified: Secondary | ICD-10-CM | POA: Diagnosis not present

## 2013-06-12 DIAGNOSIS — S239XXA Sprain of unspecified parts of thorax, initial encounter: Secondary | ICD-10-CM | POA: Diagnosis not present

## 2013-06-13 DIAGNOSIS — M949 Disorder of cartilage, unspecified: Secondary | ICD-10-CM | POA: Diagnosis not present

## 2013-06-13 DIAGNOSIS — M899 Disorder of bone, unspecified: Secondary | ICD-10-CM | POA: Diagnosis not present

## 2013-07-10 DIAGNOSIS — M9981 Other biomechanical lesions of cervical region: Secondary | ICD-10-CM | POA: Diagnosis not present

## 2013-07-10 DIAGNOSIS — S139XXA Sprain of joints and ligaments of unspecified parts of neck, initial encounter: Secondary | ICD-10-CM | POA: Diagnosis not present

## 2013-07-10 DIAGNOSIS — S239XXA Sprain of unspecified parts of thorax, initial encounter: Secondary | ICD-10-CM | POA: Diagnosis not present

## 2013-07-10 DIAGNOSIS — S335XXA Sprain of ligaments of lumbar spine, initial encounter: Secondary | ICD-10-CM | POA: Diagnosis not present

## 2013-07-10 DIAGNOSIS — M999 Biomechanical lesion, unspecified: Secondary | ICD-10-CM | POA: Diagnosis not present

## 2013-07-27 DIAGNOSIS — H35379 Puckering of macula, unspecified eye: Secondary | ICD-10-CM | POA: Diagnosis not present

## 2013-07-27 DIAGNOSIS — H524 Presbyopia: Secondary | ICD-10-CM | POA: Diagnosis not present

## 2013-07-27 DIAGNOSIS — H353190 Nonexudative age-related macular degeneration, unspecified eye, stage unspecified: Secondary | ICD-10-CM | POA: Diagnosis not present

## 2013-07-27 DIAGNOSIS — Z961 Presence of intraocular lens: Secondary | ICD-10-CM | POA: Diagnosis not present

## 2013-08-07 DIAGNOSIS — M9981 Other biomechanical lesions of cervical region: Secondary | ICD-10-CM | POA: Diagnosis not present

## 2013-08-07 DIAGNOSIS — M47817 Spondylosis without myelopathy or radiculopathy, lumbosacral region: Secondary | ICD-10-CM | POA: Diagnosis not present

## 2013-08-07 DIAGNOSIS — S239XXA Sprain of unspecified parts of thorax, initial encounter: Secondary | ICD-10-CM | POA: Diagnosis not present

## 2013-08-07 DIAGNOSIS — M999 Biomechanical lesion, unspecified: Secondary | ICD-10-CM | POA: Diagnosis not present

## 2013-08-07 DIAGNOSIS — S139XXA Sprain of joints and ligaments of unspecified parts of neck, initial encounter: Secondary | ICD-10-CM | POA: Diagnosis not present

## 2013-09-06 DIAGNOSIS — M47817 Spondylosis without myelopathy or radiculopathy, lumbosacral region: Secondary | ICD-10-CM | POA: Diagnosis not present

## 2013-09-06 DIAGNOSIS — M9981 Other biomechanical lesions of cervical region: Secondary | ICD-10-CM | POA: Diagnosis not present

## 2013-09-06 DIAGNOSIS — M999 Biomechanical lesion, unspecified: Secondary | ICD-10-CM | POA: Diagnosis not present

## 2013-09-06 DIAGNOSIS — S239XXA Sprain of unspecified parts of thorax, initial encounter: Secondary | ICD-10-CM | POA: Diagnosis not present

## 2013-09-06 DIAGNOSIS — S139XXA Sprain of joints and ligaments of unspecified parts of neck, initial encounter: Secondary | ICD-10-CM | POA: Diagnosis not present

## 2013-10-16 DIAGNOSIS — M9981 Other biomechanical lesions of cervical region: Secondary | ICD-10-CM | POA: Diagnosis not present

## 2013-10-16 DIAGNOSIS — S239XXA Sprain of unspecified parts of thorax, initial encounter: Secondary | ICD-10-CM | POA: Diagnosis not present

## 2013-10-16 DIAGNOSIS — M999 Biomechanical lesion, unspecified: Secondary | ICD-10-CM | POA: Diagnosis not present

## 2013-10-16 DIAGNOSIS — S139XXA Sprain of joints and ligaments of unspecified parts of neck, initial encounter: Secondary | ICD-10-CM | POA: Diagnosis not present

## 2013-10-16 DIAGNOSIS — M47817 Spondylosis without myelopathy or radiculopathy, lumbosacral region: Secondary | ICD-10-CM | POA: Diagnosis not present

## 2013-11-06 DIAGNOSIS — M899 Disorder of bone, unspecified: Secondary | ICD-10-CM | POA: Diagnosis not present

## 2013-11-06 DIAGNOSIS — E785 Hyperlipidemia, unspecified: Secondary | ICD-10-CM | POA: Diagnosis not present

## 2013-11-06 DIAGNOSIS — M949 Disorder of cartilage, unspecified: Secondary | ICD-10-CM | POA: Diagnosis not present

## 2013-11-06 DIAGNOSIS — J984 Other disorders of lung: Secondary | ICD-10-CM | POA: Diagnosis not present

## 2013-11-06 DIAGNOSIS — M79609 Pain in unspecified limb: Secondary | ICD-10-CM | POA: Diagnosis not present

## 2013-11-06 DIAGNOSIS — E039 Hypothyroidism, unspecified: Secondary | ICD-10-CM | POA: Diagnosis not present

## 2013-11-08 ENCOUNTER — Ambulatory Visit: Payer: Medicare Other | Attending: Family Medicine | Admitting: Physical Therapy

## 2013-11-08 DIAGNOSIS — M25569 Pain in unspecified knee: Secondary | ICD-10-CM | POA: Diagnosis not present

## 2013-11-08 DIAGNOSIS — IMO0001 Reserved for inherently not codable concepts without codable children: Secondary | ICD-10-CM | POA: Diagnosis not present

## 2013-11-08 DIAGNOSIS — M545 Low back pain, unspecified: Secondary | ICD-10-CM | POA: Diagnosis not present

## 2013-11-08 DIAGNOSIS — R5381 Other malaise: Secondary | ICD-10-CM | POA: Diagnosis not present

## 2013-11-13 ENCOUNTER — Ambulatory Visit: Payer: Medicare Other | Attending: Family Medicine | Admitting: Physical Therapy

## 2013-11-13 DIAGNOSIS — M545 Low back pain, unspecified: Secondary | ICD-10-CM | POA: Insufficient documentation

## 2013-11-13 DIAGNOSIS — R5381 Other malaise: Secondary | ICD-10-CM | POA: Insufficient documentation

## 2013-11-13 DIAGNOSIS — IMO0001 Reserved for inherently not codable concepts without codable children: Secondary | ICD-10-CM | POA: Diagnosis not present

## 2013-11-13 DIAGNOSIS — M25569 Pain in unspecified knee: Secondary | ICD-10-CM | POA: Diagnosis not present

## 2013-11-16 ENCOUNTER — Ambulatory Visit: Payer: Medicare Other | Admitting: Physical Therapy

## 2013-11-16 DIAGNOSIS — IMO0001 Reserved for inherently not codable concepts without codable children: Secondary | ICD-10-CM | POA: Diagnosis not present

## 2013-11-21 ENCOUNTER — Ambulatory Visit: Payer: Medicare Other | Admitting: Physical Therapy

## 2013-11-21 DIAGNOSIS — IMO0001 Reserved for inherently not codable concepts without codable children: Secondary | ICD-10-CM | POA: Diagnosis not present

## 2013-11-24 ENCOUNTER — Ambulatory Visit: Payer: Medicare Other | Admitting: Physical Therapy

## 2013-11-24 DIAGNOSIS — IMO0001 Reserved for inherently not codable concepts without codable children: Secondary | ICD-10-CM | POA: Diagnosis not present

## 2013-11-28 ENCOUNTER — Ambulatory Visit: Payer: Medicare Other | Admitting: Physical Therapy

## 2013-11-28 DIAGNOSIS — IMO0001 Reserved for inherently not codable concepts without codable children: Secondary | ICD-10-CM | POA: Diagnosis not present

## 2013-12-01 ENCOUNTER — Ambulatory Visit: Payer: Medicare Other | Admitting: Physical Therapy

## 2013-12-01 DIAGNOSIS — IMO0001 Reserved for inherently not codable concepts without codable children: Secondary | ICD-10-CM | POA: Diagnosis not present

## 2013-12-05 ENCOUNTER — Ambulatory Visit: Payer: Medicare Other | Admitting: Physical Therapy

## 2013-12-05 DIAGNOSIS — IMO0001 Reserved for inherently not codable concepts without codable children: Secondary | ICD-10-CM | POA: Diagnosis not present

## 2013-12-08 ENCOUNTER — Ambulatory Visit: Payer: Medicare Other | Admitting: Physical Therapy

## 2013-12-08 DIAGNOSIS — IMO0001 Reserved for inherently not codable concepts without codable children: Secondary | ICD-10-CM | POA: Diagnosis not present

## 2013-12-12 ENCOUNTER — Encounter: Payer: Medicare Other | Admitting: Physical Therapy

## 2013-12-15 ENCOUNTER — Encounter: Payer: Medicare Other | Admitting: Physical Therapy

## 2013-12-20 ENCOUNTER — Ambulatory Visit: Payer: Medicare Other | Attending: Family Medicine | Admitting: Physical Therapy

## 2013-12-20 DIAGNOSIS — M25569 Pain in unspecified knee: Secondary | ICD-10-CM | POA: Insufficient documentation

## 2013-12-20 DIAGNOSIS — R5381 Other malaise: Secondary | ICD-10-CM | POA: Diagnosis not present

## 2013-12-20 DIAGNOSIS — M545 Low back pain, unspecified: Secondary | ICD-10-CM | POA: Diagnosis not present

## 2013-12-20 DIAGNOSIS — IMO0001 Reserved for inherently not codable concepts without codable children: Secondary | ICD-10-CM | POA: Diagnosis not present

## 2013-12-22 ENCOUNTER — Encounter: Payer: Self-pay | Admitting: *Deleted

## 2013-12-26 DIAGNOSIS — L819 Disorder of pigmentation, unspecified: Secondary | ICD-10-CM | POA: Diagnosis not present

## 2013-12-26 DIAGNOSIS — D235 Other benign neoplasm of skin of trunk: Secondary | ICD-10-CM | POA: Diagnosis not present

## 2013-12-26 DIAGNOSIS — L821 Other seborrheic keratosis: Secondary | ICD-10-CM | POA: Diagnosis not present

## 2013-12-26 DIAGNOSIS — D1801 Hemangioma of skin and subcutaneous tissue: Secondary | ICD-10-CM | POA: Diagnosis not present

## 2014-01-17 DIAGNOSIS — Z23 Encounter for immunization: Secondary | ICD-10-CM | POA: Diagnosis not present

## 2014-01-17 DIAGNOSIS — M25559 Pain in unspecified hip: Secondary | ICD-10-CM | POA: Diagnosis not present

## 2014-01-17 DIAGNOSIS — M791 Myalgia: Secondary | ICD-10-CM | POA: Diagnosis not present

## 2014-01-24 DIAGNOSIS — Z800 Family history of malignant neoplasm of digestive organs: Secondary | ICD-10-CM | POA: Diagnosis not present

## 2014-01-24 DIAGNOSIS — Z1211 Encounter for screening for malignant neoplasm of colon: Secondary | ICD-10-CM | POA: Diagnosis not present

## 2014-01-24 DIAGNOSIS — K648 Other hemorrhoids: Secondary | ICD-10-CM | POA: Diagnosis not present

## 2014-02-23 DIAGNOSIS — M47816 Spondylosis without myelopathy or radiculopathy, lumbar region: Secondary | ICD-10-CM | POA: Diagnosis not present

## 2014-02-23 DIAGNOSIS — M419 Scoliosis, unspecified: Secondary | ICD-10-CM | POA: Diagnosis not present

## 2014-02-23 DIAGNOSIS — M8580 Other specified disorders of bone density and structure, unspecified site: Secondary | ICD-10-CM | POA: Diagnosis not present

## 2014-02-23 DIAGNOSIS — M4120 Other idiopathic scoliosis, site unspecified: Secondary | ICD-10-CM | POA: Diagnosis not present

## 2014-02-23 DIAGNOSIS — M791 Myalgia: Secondary | ICD-10-CM | POA: Diagnosis not present

## 2014-02-23 DIAGNOSIS — M4186 Other forms of scoliosis, lumbar region: Secondary | ICD-10-CM | POA: Diagnosis not present

## 2014-02-23 DIAGNOSIS — E559 Vitamin D deficiency, unspecified: Secondary | ICD-10-CM | POA: Diagnosis not present

## 2014-02-23 DIAGNOSIS — M5136 Other intervertebral disc degeneration, lumbar region: Secondary | ICD-10-CM | POA: Diagnosis not present

## 2014-03-28 DIAGNOSIS — M47816 Spondylosis without myelopathy or radiculopathy, lumbar region: Secondary | ICD-10-CM | POA: Diagnosis not present

## 2014-03-28 DIAGNOSIS — M4806 Spinal stenosis, lumbar region: Secondary | ICD-10-CM | POA: Diagnosis not present

## 2014-03-28 DIAGNOSIS — M4127 Other idiopathic scoliosis, lumbosacral region: Secondary | ICD-10-CM | POA: Diagnosis not present

## 2014-04-17 DIAGNOSIS — M545 Low back pain: Secondary | ICD-10-CM | POA: Diagnosis not present

## 2014-04-17 DIAGNOSIS — M47816 Spondylosis without myelopathy or radiculopathy, lumbar region: Secondary | ICD-10-CM | POA: Diagnosis not present

## 2014-04-19 ENCOUNTER — Other Ambulatory Visit (HOSPITAL_COMMUNITY): Payer: Self-pay | Admitting: Family Medicine

## 2014-04-19 DIAGNOSIS — Z1231 Encounter for screening mammogram for malignant neoplasm of breast: Secondary | ICD-10-CM

## 2014-04-26 DIAGNOSIS — N390 Urinary tract infection, site not specified: Secondary | ICD-10-CM | POA: Diagnosis not present

## 2014-05-16 DIAGNOSIS — E039 Hypothyroidism, unspecified: Secondary | ICD-10-CM | POA: Diagnosis not present

## 2014-05-16 DIAGNOSIS — Z0000 Encounter for general adult medical examination without abnormal findings: Secondary | ICD-10-CM | POA: Diagnosis not present

## 2014-05-16 DIAGNOSIS — E785 Hyperlipidemia, unspecified: Secondary | ICD-10-CM | POA: Diagnosis not present

## 2014-05-16 DIAGNOSIS — R112 Nausea with vomiting, unspecified: Secondary | ICD-10-CM | POA: Diagnosis not present

## 2014-05-16 DIAGNOSIS — Z23 Encounter for immunization: Secondary | ICD-10-CM | POA: Diagnosis not present

## 2014-05-16 DIAGNOSIS — E559 Vitamin D deficiency, unspecified: Secondary | ICD-10-CM | POA: Diagnosis not present

## 2014-05-18 ENCOUNTER — Ambulatory Visit (HOSPITAL_COMMUNITY)
Admission: RE | Admit: 2014-05-18 | Discharge: 2014-05-18 | Disposition: A | Discharge status: Home / Self Care | Payer: Medicare Other | Source: Ambulatory Visit | Attending: Family Medicine | Admitting: Family Medicine

## 2014-05-18 DIAGNOSIS — Z1231 Encounter for screening mammogram for malignant neoplasm of breast: Secondary | ICD-10-CM | POA: Diagnosis present

## 2014-06-06 DIAGNOSIS — M4127 Other idiopathic scoliosis, lumbosacral region: Secondary | ICD-10-CM | POA: Diagnosis not present

## 2014-06-06 DIAGNOSIS — M4808 Spinal stenosis, sacral and sacrococcygeal region: Secondary | ICD-10-CM | POA: Diagnosis not present

## 2014-06-06 DIAGNOSIS — M47816 Spondylosis without myelopathy or radiculopathy, lumbar region: Secondary | ICD-10-CM | POA: Diagnosis not present

## 2014-06-28 DIAGNOSIS — M7671 Peroneal tendinitis, right leg: Secondary | ICD-10-CM | POA: Diagnosis not present

## 2014-06-28 DIAGNOSIS — M2041 Other hammer toe(s) (acquired), right foot: Secondary | ICD-10-CM | POA: Diagnosis not present

## 2014-06-28 DIAGNOSIS — M2011 Hallux valgus (acquired), right foot: Secondary | ICD-10-CM | POA: Diagnosis not present

## 2014-07-17 DIAGNOSIS — M4127 Other idiopathic scoliosis, lumbosacral region: Secondary | ICD-10-CM | POA: Diagnosis not present

## 2014-07-17 DIAGNOSIS — M545 Low back pain: Secondary | ICD-10-CM | POA: Diagnosis not present

## 2014-07-17 DIAGNOSIS — M47817 Spondylosis without myelopathy or radiculopathy, lumbosacral region: Secondary | ICD-10-CM | POA: Diagnosis not present

## 2014-08-06 DIAGNOSIS — M4806 Spinal stenosis, lumbar region: Secondary | ICD-10-CM | POA: Diagnosis not present

## 2014-08-06 DIAGNOSIS — M4127 Other idiopathic scoliosis, lumbosacral region: Secondary | ICD-10-CM | POA: Diagnosis not present

## 2014-08-13 DIAGNOSIS — R748 Abnormal levels of other serum enzymes: Secondary | ICD-10-CM | POA: Diagnosis not present

## 2014-08-29 ENCOUNTER — Other Ambulatory Visit: Payer: Self-pay | Admitting: Gastroenterology

## 2014-08-29 DIAGNOSIS — R932 Abnormal findings on diagnostic imaging of liver and biliary tract: Secondary | ICD-10-CM

## 2014-08-29 DIAGNOSIS — R748 Abnormal levels of other serum enzymes: Secondary | ICD-10-CM | POA: Diagnosis not present

## 2014-08-29 DIAGNOSIS — K862 Cyst of pancreas: Secondary | ICD-10-CM | POA: Diagnosis not present

## 2014-09-04 DIAGNOSIS — H3531 Nonexudative age-related macular degeneration: Secondary | ICD-10-CM | POA: Diagnosis not present

## 2014-09-04 DIAGNOSIS — Z961 Presence of intraocular lens: Secondary | ICD-10-CM | POA: Diagnosis not present

## 2014-09-04 DIAGNOSIS — H35372 Puckering of macula, left eye: Secondary | ICD-10-CM | POA: Diagnosis not present

## 2014-09-04 DIAGNOSIS — H524 Presbyopia: Secondary | ICD-10-CM | POA: Diagnosis not present

## 2014-09-04 DIAGNOSIS — H43813 Vitreous degeneration, bilateral: Secondary | ICD-10-CM | POA: Diagnosis not present

## 2014-09-07 ENCOUNTER — Ambulatory Visit
Admission: RE | Admit: 2014-09-07 | Discharge: 2014-09-07 | Disposition: A | Discharge status: Home / Self Care | Payer: Medicare Other | Source: Ambulatory Visit | Attending: Gastroenterology | Admitting: Gastroenterology

## 2014-09-07 DIAGNOSIS — K862 Cyst of pancreas: Secondary | ICD-10-CM | POA: Diagnosis not present

## 2014-09-07 DIAGNOSIS — R932 Abnormal findings on diagnostic imaging of liver and biliary tract: Secondary | ICD-10-CM

## 2014-09-07 DIAGNOSIS — K7689 Other specified diseases of liver: Secondary | ICD-10-CM | POA: Diagnosis not present

## 2014-09-07 DIAGNOSIS — N281 Cyst of kidney, acquired: Secondary | ICD-10-CM | POA: Diagnosis not present

## 2014-09-07 MED ORDER — GADOBENATE DIMEGLUMINE 529 MG/ML IV SOLN
14.0000 mL | Freq: Once | INTRAVENOUS | Status: AC | PRN
  Administered 2014-09-07: 14 mL via INTRAVENOUS

## 2014-10-01 DIAGNOSIS — K1120 Sialoadenitis, unspecified: Secondary | ICD-10-CM | POA: Diagnosis not present

## 2014-10-01 DIAGNOSIS — K862 Cyst of pancreas: Secondary | ICD-10-CM | POA: Diagnosis not present

## 2014-10-05 DIAGNOSIS — R229 Localized swelling, mass and lump, unspecified: Secondary | ICD-10-CM | POA: Diagnosis not present

## 2014-10-05 DIAGNOSIS — M542 Cervicalgia: Secondary | ICD-10-CM | POA: Diagnosis not present

## 2014-10-10 DIAGNOSIS — K1121 Acute sialoadenitis: Secondary | ICD-10-CM | POA: Diagnosis not present

## 2014-11-15 DIAGNOSIS — E785 Hyperlipidemia, unspecified: Secondary | ICD-10-CM | POA: Diagnosis not present

## 2014-11-15 DIAGNOSIS — R51 Headache: Secondary | ICD-10-CM | POA: Diagnosis not present

## 2014-11-15 DIAGNOSIS — E039 Hypothyroidism, unspecified: Secondary | ICD-10-CM | POA: Diagnosis not present

## 2014-11-30 DIAGNOSIS — E039 Hypothyroidism, unspecified: Secondary | ICD-10-CM | POA: Diagnosis not present

## 2014-11-30 DIAGNOSIS — E785 Hyperlipidemia, unspecified: Secondary | ICD-10-CM | POA: Diagnosis not present

## 2014-12-12 DIAGNOSIS — M4806 Spinal stenosis, lumbar region: Secondary | ICD-10-CM | POA: Diagnosis not present

## 2015-01-09 DIAGNOSIS — L821 Other seborrheic keratosis: Secondary | ICD-10-CM | POA: Diagnosis not present

## 2015-01-09 DIAGNOSIS — L814 Other melanin hyperpigmentation: Secondary | ICD-10-CM | POA: Diagnosis not present

## 2015-01-09 DIAGNOSIS — D1801 Hemangioma of skin and subcutaneous tissue: Secondary | ICD-10-CM | POA: Diagnosis not present

## 2015-01-09 DIAGNOSIS — D225 Melanocytic nevi of trunk: Secondary | ICD-10-CM | POA: Diagnosis not present

## 2015-02-19 DIAGNOSIS — Z23 Encounter for immunization: Secondary | ICD-10-CM | POA: Diagnosis not present

## 2015-04-24 DIAGNOSIS — M4806 Spinal stenosis, lumbar region: Secondary | ICD-10-CM | POA: Diagnosis not present

## 2015-04-28 DIAGNOSIS — R252 Cramp and spasm: Secondary | ICD-10-CM | POA: Insufficient documentation

## 2015-04-28 DIAGNOSIS — R293 Abnormal posture: Secondary | ICD-10-CM | POA: Insufficient documentation

## 2015-04-28 DIAGNOSIS — G8929 Other chronic pain: Secondary | ICD-10-CM | POA: Insufficient documentation

## 2015-04-28 DIAGNOSIS — M5441 Lumbago with sciatica, right side: Secondary | ICD-10-CM | POA: Insufficient documentation

## 2015-04-28 DIAGNOSIS — M5442 Lumbago with sciatica, left side: Secondary | ICD-10-CM | POA: Insufficient documentation

## 2015-04-28 DIAGNOSIS — M6281 Muscle weakness (generalized): Secondary | ICD-10-CM | POA: Insufficient documentation

## 2015-05-08 ENCOUNTER — Other Ambulatory Visit: Payer: Self-pay

## 2015-05-08 DIAGNOSIS — Z1231 Encounter for screening mammogram for malignant neoplasm of breast: Secondary | ICD-10-CM

## 2015-05-20 DIAGNOSIS — Z0000 Encounter for general adult medical examination without abnormal findings: Secondary | ICD-10-CM | POA: Diagnosis not present

## 2015-05-20 DIAGNOSIS — M8588 Other specified disorders of bone density and structure, other site: Secondary | ICD-10-CM | POA: Diagnosis not present

## 2015-05-20 DIAGNOSIS — E039 Hypothyroidism, unspecified: Secondary | ICD-10-CM | POA: Diagnosis not present

## 2015-05-20 DIAGNOSIS — E785 Hyperlipidemia, unspecified: Secondary | ICD-10-CM | POA: Diagnosis not present

## 2015-05-24 ENCOUNTER — Ambulatory Visit
Admission: RE | Admit: 2015-05-24 | Discharge: 2015-05-24 | Disposition: A | Discharge status: Home / Self Care | Payer: Medicare Other | Source: Ambulatory Visit

## 2015-05-24 DIAGNOSIS — Z1231 Encounter for screening mammogram for malignant neoplasm of breast: Secondary | ICD-10-CM | POA: Diagnosis not present

## 2015-07-02 DIAGNOSIS — M8589 Other specified disorders of bone density and structure, multiple sites: Secondary | ICD-10-CM | POA: Diagnosis not present

## 2015-07-02 DIAGNOSIS — M859 Disorder of bone density and structure, unspecified: Secondary | ICD-10-CM | POA: Diagnosis not present

## 2015-07-29 DIAGNOSIS — M8588 Other specified disorders of bone density and structure, other site: Secondary | ICD-10-CM | POA: Diagnosis not present

## 2015-08-14 DIAGNOSIS — M4806 Spinal stenosis, lumbar region: Secondary | ICD-10-CM | POA: Diagnosis not present

## 2015-10-18 DIAGNOSIS — H35371 Puckering of macula, right eye: Secondary | ICD-10-CM | POA: Diagnosis not present

## 2015-10-18 DIAGNOSIS — H353110 Nonexudative age-related macular degeneration, right eye, stage unspecified: Secondary | ICD-10-CM | POA: Diagnosis not present

## 2015-10-18 DIAGNOSIS — Z961 Presence of intraocular lens: Secondary | ICD-10-CM | POA: Diagnosis not present

## 2015-10-18 DIAGNOSIS — H353120 Nonexudative age-related macular degeneration, left eye, stage unspecified: Secondary | ICD-10-CM | POA: Diagnosis not present

## 2015-11-18 DIAGNOSIS — E039 Hypothyroidism, unspecified: Secondary | ICD-10-CM | POA: Diagnosis not present

## 2015-11-18 DIAGNOSIS — M2550 Pain in unspecified joint: Secondary | ICD-10-CM | POA: Diagnosis not present

## 2015-11-18 DIAGNOSIS — M8588 Other specified disorders of bone density and structure, other site: Secondary | ICD-10-CM | POA: Diagnosis not present

## 2015-11-18 DIAGNOSIS — E785 Hyperlipidemia, unspecified: Secondary | ICD-10-CM | POA: Diagnosis not present

## 2015-11-19 DIAGNOSIS — K862 Cyst of pancreas: Secondary | ICD-10-CM | POA: Diagnosis not present

## 2015-12-19 DIAGNOSIS — Z23 Encounter for immunization: Secondary | ICD-10-CM | POA: Diagnosis not present

## 2016-01-13 DIAGNOSIS — L821 Other seborrheic keratosis: Secondary | ICD-10-CM | POA: Diagnosis not present

## 2016-01-13 DIAGNOSIS — L853 Xerosis cutis: Secondary | ICD-10-CM | POA: Diagnosis not present

## 2016-01-13 DIAGNOSIS — L814 Other melanin hyperpigmentation: Secondary | ICD-10-CM | POA: Diagnosis not present

## 2016-01-13 DIAGNOSIS — L570 Actinic keratosis: Secondary | ICD-10-CM | POA: Diagnosis not present

## 2016-01-13 DIAGNOSIS — D225 Melanocytic nevi of trunk: Secondary | ICD-10-CM | POA: Diagnosis not present

## 2016-01-15 DIAGNOSIS — M48062 Spinal stenosis, lumbar region with neurogenic claudication: Secondary | ICD-10-CM | POA: Diagnosis not present

## 2016-03-04 ENCOUNTER — Ambulatory Visit: Payer: Medicare Other | Attending: Family Medicine

## 2016-03-04 DIAGNOSIS — M6281 Muscle weakness (generalized): Secondary | ICD-10-CM | POA: Insufficient documentation

## 2016-03-04 DIAGNOSIS — M5442 Lumbago with sciatica, left side: Secondary | ICD-10-CM | POA: Insufficient documentation

## 2016-03-04 DIAGNOSIS — R293 Abnormal posture: Secondary | ICD-10-CM | POA: Insufficient documentation

## 2016-03-04 DIAGNOSIS — R252 Cramp and spasm: Secondary | ICD-10-CM | POA: Diagnosis not present

## 2016-03-04 DIAGNOSIS — G8929 Other chronic pain: Secondary | ICD-10-CM | POA: Diagnosis not present

## 2016-03-04 DIAGNOSIS — M5441 Lumbago with sciatica, right side: Secondary | ICD-10-CM | POA: Insufficient documentation

## 2016-03-04 NOTE — Therapy (Signed)
[facility] [address] [place], [place], [address] Phone: [phone]   Fax:  [phone]  Physical Therapy Evaluation  Patient Details  Name: Jacqueline Nash MRN: [id number] Date of Birth: 1940/06/22 Referring Provider: London Pepper, MD  Encounter Date: 03/04/2016      PT End of Session - 03/04/16 1317    Visit Number 1   Number of Visits 10   Date for PT Re-Evaluation 04/29/16   PT Start Time 1232   PT Stop Time [id number]   PT Time Calculation (min) 45 min   Activity Tolerance Patient tolerated treatment well   Behavior During Therapy [facility] for tasks assessed/performed      Past Medical History:  Diagnosis Date  . Cancer ([place])   . Depression   . Hyperlipidemia   . Hypothyroid   . Insomnia   . Migraine   . Osteoporosis   . Peripheral neuropathy ([place])   . Scoliosis   . Thyroid cancer ([place]) 1970  . White coat hypertension     History reviewed. No pertinent surgical history.  There were no vitals filed for this visit.       Subjective Assessment - 03/04/16 1239    Subjective Pt presents to PT with complaints of LBP of a chronic nature with anterior Rt>Lt LE radiculopathy.  Pt has signficant scoliosis.  Pt has had epidural injections for many years without significant change in symptoms.     Pertinent History Osteopenia with C4 and C5 compression fractures (2003), scoliosis    Limitations Standing;Walking   How long can you sit comfortably? no limitations   How long can you stand comfortably? LE radiculopathy- limited to 5-10   How long can you walk comfortably? < 5 minutes   Diagnostic tests none recent   Patient Stated Goals walk, lift and carry with less pain.   Currently in Pain? Yes   Pain Score 5   with standing.  No pain with sitting   Pain Location Back   Pain Orientation Right;Left   Pain Descriptors / Indicators Aching;Burning;Shooting   Pain Type Chronic pain   Pain Radiating Towards  Bil anterior thighs    Pain Onset More than a month ago   Pain Frequency Intermittent   Aggravating Factors  sitting at the piano, standing for housework, walking   Pain Relieving Factors sitting, supine, heat, ice, Flexeril            [facility] PT Assessment - 03/04/16 0001      Assessment   Medical Diagnosis Low back pain   Referring Provider London Pepper, MD   Onset Date/Surgical Date 07/03/14   Next MD Visit none   Prior Therapy none     Precautions   Precautions Other (comment)   Precaution Comments osteopenia     Restrictions   Weight Bearing Restrictions No     Balance Screen   Has the patient fallen in the past 6 months No   Has the patient had a decrease in activity level because of a fear of falling?  No   Is the patient reluctant to leave their home because of a fear of falling?  No     Home Environment   Living Environment Private residence   Living Arrangements Alone   Type of [place] to enter   Entrance Stairs-Number of Steps 3   [place] One level     Prior Function   Level of Independence Independent  Vocation Retired   Biomedical scientist none   [facility] doctor, gardening     Cognition   Overall Cognitive Status Within Functional Limits for tasks assessed     Observation/Other Assessments   Observations 59% limitation     Posture/Postural Control   Posture/Postural Control Postural limitations   Postural Limitations Flexed trunk   Posture Comments scoliosis, trunk flexion and Rt lateral flexion     ROM / Strength   AROM / PROM / Strength AROM;PROM;Strength     AROM   Overall AROM  Deficits   Overall AROM Comments Lumbar AROM limited due to scolisosis.  Extension is limited by 50%. Lt sidebending limited by 50%     PROM   Overall PROM  Within functional limits for tasks performed   Overall PROM Comments full hip PROM in all directions      Strength   Overall Strength Deficits   Overall Strength  Comments LE strengths 4 to 4+/5     Palpation   Palpation comment Pt with palapable tenderness over Rt thoracolumbar paraspinals and glute med/min with trigger points     Ambulation/Gait   Ambulation/Gait Yes   Ambulation/Gait Assistance 7: Independent   Gait Pattern Step-through pattern;Antalgic;Lateral trunk lean to right   Gait Comments hip weakness demonstrated with gait due to scoliosis                           PT Education - 03/04/16 1311    Education provided Yes   Education Details hip flexibility   Person(s) Educated Patient   Methods Explanation;Demonstration;Handout   Comprehension Verbalized understanding;Returned demonstration          PT Short Term Goals - 03/04/16 1325      PT SHORT TERM GOAL #1   Title be independent in initial HEP   Time 4   Period Weeks   Status New     PT SHORT TERM GOAL #2   Title report a 25% reduction in LBP/LE radiculopathy to allow for improved standing tolerance   Time 4   Period Weeks   Status New     PT SHORT TERM GOAL #3   Title demonstrate and verbalize understanding of correct body mechanics for spinal protection   Time 4   Period Weeks   Status New           PT Long Term Goals - 03/04/16 1226      PT LONG TERM GOAL #1   Title be independent in advanced HEP   Time 8   Period Weeks   Status New     PT LONG TERM GOAL #2   Title reduce FOTO to < or = to 49% limitation   Time 8   Period Weeks   Status New     PT LONG TERM GOAL #3   Title report a 40% reduction in LBP/LE radiculopathy to allow for improved standing tolerance   Time 8   Period Weeks   Status New     PT LONG TERM GOAL #4   Title tolerate standing for 10-12 minutes for housework without need to sit   Time 8   Period Weeks   Status New               Plan - 03/04/16 1320    Clinical Impression Statement Pt presents to PT with complaints of chronic LBP and bil. LE radiculopathy.  Pt with significant scoliotic  curvature and muscular  imbalances that impact gait pattern and ability to stand long periods.  Pt with LE weakness, up to 7/10 LBP with standing and anterior thigh radiculopathy.  Standing is limited to 5-10 minutes and walking < 5 minutes due to pain.  Pt is a moderate complexity evaluation due to multiple body parts, comorbidities impacting care such as scoliosis and osteopenia and evolving condition.  Pt will benefit from skilled PT for manual/dry needling to muscular trigger points, postural and core strength and hip flexibility.     Rehab Potential Good   PT Frequency 2x / week   PT Duration 8 weeks   PT Treatment/Interventions ADLs/Self Care Home Management;Cryotherapy;Electrical Stimulation;Functional mobility training;Ultrasound;Moist Heat;Therapeutic activities;Therapeutic exercise;Neuromuscular re-education;Patient/family education;Passive range of motion;Dry needling;Taping   PT Next Visit Plan Dry needling to Lt thoracic paraspinals and gluteals, thoracic strength, gentle flexibility with emphasis on not flexing at the spine    Consulted and Agree with Plan of Care Patient      Patient will benefit from skilled therapeutic intervention in order to improve the following deficits and impairments:  Pain, Postural dysfunction, Decreased strength, Impaired flexibility, Improper body mechanics, Decreased activity tolerance, Increased muscle spasms, Decreased endurance, Decreased range of motion, Difficulty walking  Visit Diagnosis: Chronic bilateral low back pain with bilateral sciatica - Plan: PT plan of care cert/re-cert  Abnormal posture - Plan: PT plan of care cert/re-cert  Muscle weakness (generalized) - Plan: PT plan of care cert/re-cert  Cramp and spasm - Plan: PT plan of care cert/re-cert      G-Codes - 0000000 1230    Functional Assessment Tool Used FOTOT: 59% limitation   Functional Limitation Mobility: Walking and moving around   Mobility: Walking and Moving Around Current  Status [id number]) At least 40 percent but less than 60 percent impaired, limited or restricted   Mobility: Walking and Moving Around Goal Status [id number]) At least 40 percent but less than 60 percent impaired, limited or restricted       Problem List There are no active problems to display for this patient.    Sigurd Sos, PT 03/04/16 1:31 PM  [facility] [address] [place], [place], [address] Phone: [phone]   Fax:  [phone]  Name: Jacqueline Nash MRN: [id number] Date of Birth: 1940/04/19

## 2016-03-04 NOTE — Patient Instructions (Addendum)
Hold 20 seconds, do 3 on each side.  Do 3x/day Hip Stretch  Put right ankle over left knee. Let right knee fall downward, but keep ankle in place. Feel the stretch in hip. May push down gently with hand to feel stretch. Hold ____ seconds while counting out loud. Repeat with other leg. Repeat ____ times. Do ____ sessions per day.   Stretching: Piriformis (Supine)  Pull right knee toward opposite shoulder. Hold ____ seconds. Relax. Repeat ____ times per set. Do ____ sets per session. Do ____ sessions per day.  HIP: Hamstrings - Short Sitting    Rest leg on raised surface. Keep knee straight. Lift chest. Hold ___ seconds. ___ reps per set, ___ sets per day, ___ days per week  Copyright  VHI. All rights reserved.    [place] [address], [place], [place] [address] Phone # [phone] Fax [phone]

## 2016-03-11 ENCOUNTER — Ambulatory Visit: Payer: Medicare Other

## 2016-03-11 DIAGNOSIS — R252 Cramp and spasm: Secondary | ICD-10-CM | POA: Diagnosis not present

## 2016-03-11 DIAGNOSIS — M5441 Lumbago with sciatica, right side: Principal | ICD-10-CM

## 2016-03-11 DIAGNOSIS — R293 Abnormal posture: Secondary | ICD-10-CM

## 2016-03-11 DIAGNOSIS — M6281 Muscle weakness (generalized): Secondary | ICD-10-CM

## 2016-03-11 DIAGNOSIS — M5442 Lumbago with sciatica, left side: Secondary | ICD-10-CM | POA: Diagnosis not present

## 2016-03-11 DIAGNOSIS — G8929 Other chronic pain: Secondary | ICD-10-CM | POA: Diagnosis not present

## 2016-03-11 NOTE — Patient Instructions (Addendum)

## 2016-03-11 NOTE — Therapy (Signed)
[facility] [address] [place], [place], [address] Phone: [phone]   Fax:  [phone]  Physical Therapy Treatment  Patient Details  Name: Jacqueline Nash MRN: [id number] Date of Birth: 05-03-1940 Referring Provider: London Pepper, MD  Encounter Date: 03/11/2016      PT End of Session - 03/11/16 1441    Visit Number 2   Number of Visits 10   Date for PT Re-Evaluation 04/29/16   PT Start Time 1400   PT Stop Time 1455   PT Time Calculation (min) 55 min   Activity Tolerance Patient tolerated treatment well   Behavior During Therapy [facility] for tasks assessed/performed      Past Medical History:  Diagnosis Date  . Cancer ([place])   . Depression   . Hyperlipidemia   . Hypothyroid   . Insomnia   . Migraine   . Osteoporosis   . Peripheral neuropathy ([place])   . Scoliosis   . Thyroid cancer ([place]) 1970  . White coat hypertension     History reviewed. No pertinent surgical history.  There were no vitals filed for this visit.      Subjective Assessment - 03/11/16 1402    Subjective Pt has a TENs unit now and is using at home for pain.  Stretching is going well.     Pertinent History Osteopenia with C4 and C5 compression fractures (2003), scoliosis    Currently in Pain? Yes   Pain Score 2    Pain Location Back   Pain Orientation Right;Left   Pain Descriptors / Indicators Aching;Burning;Shooting   Pain Type Chronic pain   Pain Onset More than a month ago   Pain Frequency Intermittent   Aggravating Factors  sitting at the piano, standing for housework, walking   Pain Relieving Factors sitting, supine, heat, ice, Flexeril                         [facility] Adult PT Treatment/Exercise - 03/11/16 0001      Exercises   Exercises Lumbar;Knee/Hip     Lumbar Exercises: Stretches   Active Hamstring Stretch 3 reps;20 seconds   Single Knee to Chest Stretch 3 reps;20 seconds   Piriformis Stretch 3  reps;20 seconds     Modalities   Modalities Moist Heat     Moist Heat Therapy   Number Minutes Moist Heat 15 Minutes   Moist Heat Location Lumbar Spine     Manual Therapy   Manual Therapy Soft tissue mobilization;Myofascial release   Manual therapy comments soft tissue elongation and trigger point release to Rt thoracic/lumbar paraspinals and Rt gluteals          Trigger Point Dry Needling - 03/11/16 1437    Consent Given? Yes   Education Handout Provided Yes   Muscles Treated Lower Body Gluteus minimus;Gluteus maximus;Piriformis  Rt only, and lumbar paraspinals and multifidi on the Rt   Gluteus Maximus Response Palpable increased muscle length   Gluteus Minimus Response Palpable increased muscle length   Piriformis Response Palpable increased muscle length              PT Education - 03/11/16 1405    Education provided Yes   Education Details DN info   Person(s) Educated Patient   Methods Explanation;Demonstration;Handout   Comprehension Verbalized understanding;Returned demonstration          PT Short Term Goals - 03/11/16 1439      PT SHORT TERM GOAL #1  Title be independent in initial HEP   Time 4   Period Weeks   Status On-going     PT SHORT TERM GOAL #2   Title report a 25% reduction in LBP/LE radiculopathy to allow for improved standing tolerance   Time 4   Period Weeks   Status On-going           PT Long Term Goals - 03/04/16 1226      PT LONG TERM GOAL #1   Title be independent in advanced HEP   Time 8   Period Weeks   Status New     PT LONG TERM GOAL #2   Title reduce FOTO to < or = to 49% limitation   Time 8   Period Weeks   Status New     PT LONG TERM GOAL #3   Title report a 40% reduction in LBP/LE radiculopathy to allow for improved standing tolerance   Time 8   Period Weeks   Status New     PT LONG TERM GOAL #4   Title tolerate standing for 10-12 minutes for housework without need to sit   Time 8   Period Weeks    Status New               Plan - 03/11/16 1439    Clinical Impression Statement Pt with only 1 session after evaluation.  Pt with tension and trigger points in Rt thoracic/lumbar paraspinals and gluteals and demonstrated improved mobility and reduced pain after manual and dry needling today.  Pt is independent in current HEP.  Pt will continue to benefit from skilled PT for Rt lumbar/gluteal flexibility, postural strength and core strength.     Rehab Potential Good   PT Frequency 2x / week   PT Duration 8 weeks   PT Treatment/Interventions ADLs/Self Care Home Management;Cryotherapy;Electrical Stimulation;Functional mobility training;Ultrasound;Moist Heat;Therapeutic activities;Therapeutic exercise;Neuromuscular re-education;Patient/family education;Passive range of motion;Dry needling;Taping   PT Next Visit Plan Assess response to dry needling,  thoracic strength, gentle flexibility with emphasis on not flexing at the spine, body mechanics/posture education   Consulted and Agree with Plan of Care Patient      Patient will benefit from skilled therapeutic intervention in order to improve the following deficits and impairments:  Pain, Postural dysfunction, Decreased strength, Impaired flexibility, Improper body mechanics, Decreased activity tolerance, Increased muscle spasms, Decreased endurance, Decreased range of motion, Difficulty walking  Visit Diagnosis: Chronic bilateral low back pain with bilateral sciatica  Abnormal posture  Muscle weakness (generalized)  Cramp and spasm     Problem List There are no active problems to display for this patient.   Jacqueline Nash, PT 03/11/16 2:44 PM  [facility] [address] [place], [place], [address] Phone: [phone]   Fax:  [phone]  Name: Jacqueline Nash MRN: [id number] Date of Birth: Sep 21, 1940

## 2016-03-13 ENCOUNTER — Ambulatory Visit: Payer: Medicare Other | Attending: Family Medicine | Admitting: Physical Therapy

## 2016-03-13 ENCOUNTER — Encounter: Payer: Self-pay | Admitting: Physical Therapy

## 2016-03-13 DIAGNOSIS — M5441 Lumbago with sciatica, right side: Secondary | ICD-10-CM | POA: Diagnosis not present

## 2016-03-13 DIAGNOSIS — M6281 Muscle weakness (generalized): Secondary | ICD-10-CM | POA: Insufficient documentation

## 2016-03-13 DIAGNOSIS — R293 Abnormal posture: Secondary | ICD-10-CM | POA: Diagnosis not present

## 2016-03-13 DIAGNOSIS — R252 Cramp and spasm: Secondary | ICD-10-CM | POA: Insufficient documentation

## 2016-03-13 DIAGNOSIS — G8929 Other chronic pain: Secondary | ICD-10-CM | POA: Diagnosis not present

## 2016-03-13 DIAGNOSIS — M5442 Lumbago with sciatica, left side: Secondary | ICD-10-CM | POA: Diagnosis not present

## 2016-03-13 NOTE — Therapy (Signed)
[facility] [address], [place], [place], [address] Phone: [phone]   Fax:  [phone]  Physical Therapy Treatment  Patient Details  Name: Destene Barten MRN: [id number] Date of Birth: 27-Feb-1941 Referring Provider: London Pepper, MD  Encounter Date: 03/13/2016      PT End of Session - 03/13/16 1030    Visit Number 3   Number of Visits 10   Date for PT Re-Evaluation 04/29/16   PT Start Time 1024   PT Stop Time 1105   PT Time Calculation (min) 41 min   Activity Tolerance Patient tolerated treatment well   Behavior During Therapy [facility] for tasks assessed/performed      Past Medical History:  Diagnosis Date  . Cancer ([place])   . Depression   . Hyperlipidemia   . Hypothyroid   . Insomnia   . Migraine   . Osteoporosis   . Peripheral neuropathy ([place])   . Scoliosis   . Thyroid cancer ([place]) 1970  . White coat hypertension     History reviewed. No pertinent surgical history.  There were no vitals filed for this visit.      Subjective Assessment - 03/13/16 1026    Subjective Pt states she did a lot of walking around doing shopping and is feeling like her feet were dragging after a while.  States she used heat and TENS unit after that.   Limitations Standing;Walking   How long can you sit comfortably? no limitations   How long can you stand comfortably? LE radiculopathy- limited to 5-10   How long can you walk comfortably? < 5 minutes   Diagnostic tests none recent   Patient Stated Goals walk, lift and carry with less pain.   Currently in Pain? Yes   Pain Score 2    Pain Location Hip   Pain Orientation Right;Left   Pain Descriptors / Indicators Aching;Burning;Shooting   Pain Type Chronic pain   Pain Radiating Towards bilateral thighs lateral   Pain Frequency Intermittent   Aggravating Factors  walking   Pain Relieving Factors heat and TENS, sitting   Multiple Pain Sites No                          [facility] Adult PT Treatment/Exercise - 03/13/16 0001      Lumbar Exercises: Stretches   Active Hamstring Stretch 3 reps;20 seconds   Single Knee to Chest Stretch 3 reps;20 seconds   Lower Trunk Rotation 4 reps;10 seconds  LE on red ball   Piriformis Stretch 3 reps;20 seconds     Lumbar Exercises: Supine   Ab Set 5 reps  transverse abdominis contraction   Bent Knee Raise 20 reps;3 seconds   Straight Leg Raise --     Knee/Hip Exercises: Aerobic   Nustep L1 x 8 minutes     Manual Therapy   Manual Therapy Soft tissue mobilization   Manual therapy comments soft tissue elongation and trigger point release to Rt thoracic/lumbar paraspinals and Rt gluteals, and QL                  PT Short Term Goals - 03/13/16 1219      PT SHORT TERM GOAL #1   Title be independent in initial HEP   Time 4   Period Weeks   Status Achieved           PT Long Term Goals - 03/04/16 1226  PT LONG TERM GOAL #1   Title be independent in advanced HEP   Time 8   Period Weeks   Status New     PT LONG TERM GOAL #2   Title reduce FOTO to < or = to 49% limitation   Time 8   Period Weeks   Status New     PT LONG TERM GOAL #3   Title report a 40% reduction in LBP/LE radiculopathy to allow for improved standing tolerance   Time 8   Period Weeks   Status New     PT LONG TERM GOAL #4   Title tolerate standing for 10-12 minutes for housework without need to sit   Time 8   Period Weeks   Status New               Plan - 03/13/16 1215    Clinical Impression Statement Pt was sore at the end of the day yesterday due to a lot of walking.  Pt had no adverse response from dry needling.  Pt has been doing stretches everyday and is overall feeling improvements.  Pt has difficulty activating lower abdominal muscles.  She had very tender points on bilateral QL muscles and Rt glutes.  Pt will benefit from continued PT for trigger point release and  core and postural strengthening.   Rehab Potential Good   PT Frequency 2x / week   PT Duration 8 weeks   PT Next Visit Plan assess response to soft tissue mobilization, thoracic and core strengthening progressed as tolerated with posture and body mechanics education   PT Home Exercise Plan progress as needed   Consulted and Agree with Plan of Care Patient      Patient will benefit from skilled therapeutic intervention in order to improve the following deficits and impairments:  Pain, Postural dysfunction, Decreased strength, Impaired flexibility, Improper body mechanics, Decreased activity tolerance, Increased muscle spasms, Decreased endurance, Decreased range of motion, Difficulty walking  Visit Diagnosis: Chronic bilateral low back pain with bilateral sciatica  Abnormal posture  Muscle weakness (generalized)  Cramp and spasm     Problem List There are no active problems to display for this patient.   Zannie Cove, PT 03/13/2016, 12:20 PM   [facility] [address], [place], [place], [address] Phone: [phone]   Fax:  [phone]  Name: Minie Stettler MRN: [id number] Date of Birth: 1941-03-18

## 2016-03-16 ENCOUNTER — Ambulatory Visit: Payer: Medicare Other

## 2016-03-16 DIAGNOSIS — G8929 Other chronic pain: Secondary | ICD-10-CM

## 2016-03-16 DIAGNOSIS — R293 Abnormal posture: Secondary | ICD-10-CM

## 2016-03-16 DIAGNOSIS — R252 Cramp and spasm: Secondary | ICD-10-CM

## 2016-03-16 DIAGNOSIS — M6281 Muscle weakness (generalized): Secondary | ICD-10-CM

## 2016-03-16 DIAGNOSIS — M5442 Lumbago with sciatica, left side: Secondary | ICD-10-CM | POA: Diagnosis not present

## 2016-03-16 DIAGNOSIS — M5441 Lumbago with sciatica, right side: Principal | ICD-10-CM

## 2016-03-16 NOTE — Therapy (Signed)
[facility] [address] [place], [place], [address] Phone: [phone]   Fax:  [phone]  Physical Therapy Treatment  Patient Details  Name: Jacqueline Nash MRN: [id number] Date of Birth: [birth year] Referring Provider: London Pepper, MD  Encounter Date: 03/16/2016      PT End of Session - 03/16/16 1524    Visit Number 4   Number of Visits 10   Date for PT Re-Evaluation 04/29/16   PT Start Time [id number]   PT Stop Time 1542   PT Time Calculation (min) 56 min   Activity Tolerance Patient tolerated treatment well   Behavior During Therapy [facility] for tasks assessed/performed      Past Medical History:  Diagnosis Date  . Cancer ([place])   . Depression   . Hyperlipidemia   . Hypothyroid   . Insomnia   . Migraine   . Osteoporosis   . Peripheral neuropathy ([place])   . Scoliosis   . Thyroid cancer ([place]) 1970  . White coat hypertension     History reviewed. No pertinent surgical history.  There were no vitals filed for this visit.      Subjective Assessment - 03/16/16 1449    Subjective Pt reports >50% overall improvement since the start of care.  Using TENs and doing stretches.     Pertinent History Osteopenia with C4 and C5 compression fractures (2003), scoliosis    Currently in Pain? Yes   Pain Score 2    Pain Location Leg   Pain Orientation Right   Pain Descriptors / Indicators Aching;Burning;Shooting   Pain Type Chronic pain   Pain Onset More than a month ago   Pain Frequency Intermittent   Aggravating Factors  too much activity   Pain Relieving Factors heat, stretching, TENS                         [facility] Adult PT Treatment/Exercise - 03/16/16 0001      Lumbar Exercises: Stretches   Active Hamstring Stretch 3 reps;20 seconds   Single Knee to Chest Stretch 3 reps;20 seconds   Lower Trunk Rotation 4 reps;10 seconds  LE on red ball     Lumbar Exercises: Standing   Other Standing  Lumbar Exercises red theraband: rowing and shoulder extension bil.  2x10     Moist Heat Therapy   Number Minutes Moist Heat 15 Minutes   Moist Heat Location Lumbar Spine     Manual Therapy   Manual Therapy Soft tissue mobilization   Manual therapy comments soft tissue elongation and trigger point release to Rt thoracic/lumbar paraspinals and Rt gluteals          Trigger Point Dry Needling - 03/16/16 1458    Consent Given? Yes   Muscles Treated Lower Body Gluteus minimus;Gluteus maximus;Piriformis  Rt only, paraspinals and lumbar multifidi on Rt   Gluteus Maximus Response Twitch response elicited;Palpable increased muscle length   Gluteus Minimus Response Twitch response elicited;Palpable increased muscle length   Piriformis Response Twitch response elicited;Palpable increased muscle length              PT Education - 03/16/16 1457    Education provided Yes   Education Details scapular attached with abdominal bracing   Person(s) Educated Patient   Methods Explanation;Demonstration;Handout   Comprehension Verbalized understanding;Returned demonstration          PT Short Term Goals - 03/16/16 1451      PT SHORT TERM GOAL #1  Title be independent in initial HEP   Status Achieved     PT SHORT TERM GOAL #2   Title report a 25% reduction in LBP/LE radiculopathy to allow for improved standing tolerance   Status Achieved     PT SHORT TERM GOAL #3   Title demonstrate and verbalize understanding of correct body mechanics for spinal protection   Time 4   Period Weeks   Status On-going           PT Long Term Goals - 03/04/16 1226      PT LONG TERM GOAL #1   Title be independent in advanced HEP   Time 8   Period Weeks   Status New     PT LONG TERM GOAL #2   Title reduce FOTO to < or = to 49% limitation   Time 8   Period Weeks   Status New     PT LONG TERM GOAL #3   Title report a 40% reduction in LBP/LE radiculopathy to allow for improved standing  tolerance   Time 8   Period Weeks   Status New     PT LONG TERM GOAL #4   Title tolerate standing for 10-12 minutes for housework without need to sit   Time 8   Period Weeks   Status New               Plan - 03/16/16 1451    Clinical Impression Statement Pt reports > 50% reduction in symptoms since the start of care.  Pt has been doing stretching and using TENs for pain.  Pt with tender points over Rt gluteals, paraspinals and quadratus.  Pt demonstrated improved tissue mobility after dry needling today.  Pt will benefit from skilled PT for core strength, manual, modalities and body mechanics education.     Rehab Potential Good   PT Frequency 2x / week   PT Duration 8 weeks   PT Treatment/Interventions ADLs/Self Care Home Management;Cryotherapy;Electrical Stimulation;Functional mobility training;Ultrasound;Moist Heat;Therapeutic activities;Therapeutic exercise;Neuromuscular re-education;Patient/family education;Passive range of motion;Dry needling;Taping   PT Next Visit Plan assess response to dry neelding, thoracic and core strengthening progressed as tolerated with posture and body mechanics education   Consulted and Agree with Plan of Care Patient      Patient will benefit from skilled therapeutic intervention in order to improve the following deficits and impairments:  Pain, Postural dysfunction, Decreased strength, Impaired flexibility, Improper body mechanics, Decreased activity tolerance, Increased muscle spasms, Decreased endurance, Decreased range of motion, Difficulty walking  Visit Diagnosis: Chronic bilateral low back pain with bilateral sciatica  Abnormal posture  Muscle weakness (generalized)  Cramp and spasm     Problem List There are no active problems to display for this patient.  Sigurd Sos, PT 03/16/16 3:26 PM  [facility] [address], [place], [place], [address] Phone:  [phone]   Fax:  [phone]  Name: Jacqueline Nash MRN: [id number] Date of Birth: 20-May-1940

## 2016-03-16 NOTE — Patient Instructions (Addendum)
KEEP HEAD IN NEUTRAL AND SHOULDERS DOWN AND RELAXED   Both arms at the same time.   Hold tubing in right hand, arm forward. Pull arm back, elbow straight. Repeat __10__ times per set. Do __2__ sets per session. Do _1-2___ sessions per day.  Copyright  VHI. All rights reserved.     With resistive band anchored in door, grasp both ends. Keeping elbows bent, pull back, squeezing shoulder blades together. Hold _3__ seconds. Repeat _2x10___ times. Do _1-2___ sessions per day.  [email or web address].exer.[email or web address]   [facility] [address], [place], [place] [address] Phone # [phone] Fax [phone]

## 2016-03-18 ENCOUNTER — Ambulatory Visit: Payer: Medicare Other

## 2016-03-18 DIAGNOSIS — M6281 Muscle weakness (generalized): Secondary | ICD-10-CM

## 2016-03-18 DIAGNOSIS — R252 Cramp and spasm: Secondary | ICD-10-CM

## 2016-03-18 DIAGNOSIS — R293 Abnormal posture: Secondary | ICD-10-CM

## 2016-03-18 DIAGNOSIS — M5441 Lumbago with sciatica, right side: Secondary | ICD-10-CM | POA: Diagnosis not present

## 2016-03-18 DIAGNOSIS — G8929 Other chronic pain: Secondary | ICD-10-CM

## 2016-03-18 DIAGNOSIS — M5442 Lumbago with sciatica, left side: Principal | ICD-10-CM

## 2016-03-18 NOTE — Therapy (Signed)
[facility] [address], [place], [place], [address] Phone: [phone]   Fax:  [phone]  Physical Therapy Treatment  Patient Details  Name: Adja Dadisman MRN: [id number] Date of Birth: 1941-01-01 Referring Provider: London Pepper, MD  Encounter Date: 03/18/2016      PT End of Session - 03/18/16 1309    Visit Number 5   Number of Visits 10   Date for PT Re-Evaluation 04/29/16   PT Start Time 1230   PT Stop Time 1311   PT Time Calculation (min) 41 min   Activity Tolerance Patient tolerated treatment well   Behavior During Therapy [facility] for tasks assessed/performed      Past Medical History:  Diagnosis Date  . Cancer ([place])   . Depression   . Hyperlipidemia   . Hypothyroid   . Insomnia   . Migraine   . Osteoporosis   . Peripheral neuropathy ([place])   . Scoliosis   . Thyroid cancer ([place]) 1970  . White coat hypertension     History reviewed. No pertinent surgical history.  There were no vitals filed for this visit.      Subjective Assessment - 03/18/16 1239    Subjective Pt reports that she felt great the day after getting dry needling.     Patient Stated Goals walk, lift and carry with less pain.                         [facility] Adult PT Treatment/Exercise - 03/18/16 0001      Lumbar Exercises: Stretches   Active Hamstring Stretch 3 reps;20 seconds   Single Knee to Chest Stretch 3 reps;20 seconds   Piriformis Stretch 3 reps;20 seconds     Lumbar Exercises: Supine   Other Supine Lumbar Exercises yellow theraband horizontal abduction and ER      Lumbar Exercises: Sidelying   Clam 20 reps     Knee/Hip Exercises: Aerobic   Nustep L1 x 8 minutes  PT present to assess status                PT Education - 03/18/16 1245    Education provided Yes   Education Details Economist education, dos/donts of osteoporosis, supine yellow theraband   Person(s) Educated Patient    Methods Explanation;Demonstration;Handout   Comprehension Verbalized understanding;Returned demonstration          PT Short Term Goals - 03/18/16 1246      PT SHORT TERM GOAL #3   Title demonstrate and verbalize understanding of correct body mechanics for spinal protection   Status Achieved           PT Long Term Goals - 03/04/16 1226      PT LONG TERM GOAL #1   Title be independent in advanced HEP   Time 8   Period Weeks   Status New     PT LONG TERM GOAL #2   Title reduce FOTO to < or = to 49% limitation   Time 8   Period Weeks   Status New     PT LONG TERM GOAL #3   Title report a 40% reduction in LBP/LE radiculopathy to allow for improved standing tolerance   Time 8   Period Weeks   Status New     PT LONG TERM GOAL #4   Title tolerate standing for 10-12 minutes for housework without need to sit   Time 8   Period Weeks  Status New               Plan - 03/18/16 1300    Clinical Impression Statement Pt has received body mechanics education and osteoporosis education today and will work to make modifications with movement at home.  Pt with shoulder pain with standing scapular stabilization exercises so modified to supine exercises.  Pt has started use use cane to improve trunk alignment and balance for community distances.    Pt will continue to benefit from skilled PT for posutral/core strength,    Rehab Potential Good   PT Frequency 2x / week   PT Duration 8 weeks   PT Treatment/Interventions ADLs/Self Care Home Management;Cryotherapy;Electrical Stimulation;Functional mobility training;Ultrasound;Moist Heat;Therapeutic activities;Therapeutic exercise;Neuromuscular re-education;Patient/family education;Passive range of motion;Dry needling;Taping   PT Next Visit Plan Scapular stabilization, core strength, flexibility, dry needling   Consulted and Agree with Plan of Care Patient      Patient will benefit from skilled therapeutic intervention in order  to improve the following deficits and impairments:  Pain, Postural dysfunction, Decreased strength, Impaired flexibility, Improper body mechanics, Decreased activity tolerance, Increased muscle spasms, Decreased endurance, Decreased range of motion, Difficulty walking  Visit Diagnosis: Chronic bilateral low back pain with bilateral sciatica  Abnormal posture  Muscle weakness (generalized)  Cramp and spasm     Problem List There are no active problems to display for this patient.    Sigurd Sos, PT 03/18/16 1:18 PM  [facility] [address] [place], [place], [address] Phone: [phone]   Fax:  [phone]  Name: Kissa Hoole MRN: [id number] Date of Birth: November 26, 1940

## 2016-03-18 NOTE — Patient Instructions (Addendum)
DO's and DON'T's   Avoid and/or Minimize positions of forward bending ( flexion)  Side bending and rotation of the trunk  Especially when movements occur together   When your back aches:   Don't sit down   Lie down on your back with a small pillow under your head and one under your knees or as outlined by our therapist. Or, lie in the 90/90 position ( on the floor with your feet and legs on the sofa with knees and hips bent to 90 degrees)  Tying or putting on your shoes:   Don't bend over to tie your shoes or put on socks.  Instead, bring one foot up, cross it over the opposite knee and bend forward (hinge) at the hips to so the task.  Keep your back straight.  If you cannot do this safely, then you need to use long handled assistive devices such as a shoehorn and sock puller.  Exercising:  Don't engage in ballistic types of exercise routines such as high-impact aerobics or jumping rope  Don't do exercises in the gym that bring you forward (abdominal crunches, sit-ups, touching your  toes, knee-to-chest, straight leg raising.)  Follow a regular exercise program that includes a variety of different weight-bearing activities, such as low-impact aerobics, T' ai chi or walking as your physical therapist advises  Do exercises that emphasize return to normal body alignment and strengthening of the muscles that keep your back straight, as outlined in this program or by your therapist  Household tasks:  Don't reach unnecessarily or twist your trunk when mopping, sweeping, vacuuming, raking, making beds, weeding gardens, getting objects ou of cupboards, etc.  Keep your broom, mop, vacuum, or rake close to you and mover your whole body as you move them. Walk over to the area on which you are working. Arrange kitchen, bathroom, and bedroom shelves so that frequently used items may be reached without excessive bending, twisting, and reaching.  Use a  sturdy stool if necessary.  Don't bend from the waist to pick up something up  Off the floor, out of the trunk of your car, or to brush your teeth, wash your face, etc.   Bend at the knees, keeping back straight as possible. Use a reacher if necessary.   Prevention of fracture is the so-called "BOTTOm -Line" in the management of OSTEOPOROSIS. Do not take unnecessary chances in movement. Once a compression fracture occurs, the process is very difficult to control; one fracture is frequently followed by many more.       Lifting Principles  .Maintain proper posture and head alignment. .Slide object as close as possible before lifting. .Move obstacles out of the way. .Test before lifting; ask for help if too heavy. .Tighten stomach muscles without holding breath. .Use smooth movements; do not jerk. .Use legs to do the work, and pivot with feet. .Distribute the work load symmetrically and close to the center of trunk. .Push instead of pull whenever possible.   Squat down and hold basket close to stand. Use leg muscles to do the work.    Avoid twisting or bending back. Pivot around using foot movements, and bend at knees if needed when reaching for articles.        Getting Into / Out of Bed   Lower self to lie down on one side by raising legs and lowering head at the same time. Use arms to assist moving without twisting. Bend both knees to roll onto back if desired. To sit  up, start from lying on side, and use same move-ments in reverse. Keep trunk aligned with legs.    Shift weight from front foot to back foot as item is lifted off shelf.    When leaning forward to pick object up from floor, extend one leg out behind. Keep back straight. Hold onto a sturdy support with other hand.      Sit upright, head facing forward. Try using a roll to support lower back. Keep shoulders relaxed, and avoid rounded back. Keep hips level with knees. Avoid crossing legs for long  periods.    Side Pull: Double Arm   On back, knees bent, feet flat. Arms perpendicular to body, shoulder level, elbows straight but relaxed. Pull arms out to sides, elbows straight. Resistance band comes across collarbones, hands toward floor. Hold momentarily. Slowly return to starting position. Repeat _2x10__ times. Band color _yellow____     Shoulder Rotation: Double Arm   On back, knees bent, feet flat, elbows tucked at sides, bent 90, hands palms up. Pull hands apart and down toward floor, keeping elbows near sides. Hold momentarily. Slowly return to starting position. Repeat 2x10___ times. Band color yellow  [facility] [address] [place],  [address] Phone # [phone] Fax [phone]

## 2016-03-23 ENCOUNTER — Ambulatory Visit: Payer: Medicare Other

## 2016-03-23 DIAGNOSIS — M5442 Lumbago with sciatica, left side: Principal | ICD-10-CM

## 2016-03-23 DIAGNOSIS — R252 Cramp and spasm: Secondary | ICD-10-CM | POA: Diagnosis not present

## 2016-03-23 DIAGNOSIS — M5441 Lumbago with sciatica, right side: Principal | ICD-10-CM

## 2016-03-23 DIAGNOSIS — R293 Abnormal posture: Secondary | ICD-10-CM

## 2016-03-23 DIAGNOSIS — G8929 Other chronic pain: Secondary | ICD-10-CM | POA: Diagnosis not present

## 2016-03-23 DIAGNOSIS — M6281 Muscle weakness (generalized): Secondary | ICD-10-CM

## 2016-03-23 NOTE — Therapy (Signed)
[facility] [address], [place], [place], [address] Phone: [phone]   Fax:  [phone]  Physical Therapy Treatment  Patient Details  Name: Jacqueline Nash MRN: [id number] Date of Birth: 1940-07-13 Referring Provider: London Pepper, MD  Encounter Date: 03/23/2016      PT End of Session - 03/23/16 1309    Visit Number 6   Number of Visits 10   Date for PT Re-Evaluation 04/29/16   PT Start Time 1230  dry needling   PT Stop Time 1318   PT Time Calculation (min) 48 min   Activity Tolerance Patient tolerated treatment well   Behavior During Therapy [facility] for tasks assessed/performed      Past Medical History:  Diagnosis Date  . Cancer ([place])   . Depression   . Hyperlipidemia   . Hypothyroid   . Insomnia   . Migraine   . Osteoporosis   . Peripheral neuropathy ([place])   . Scoliosis   . Thyroid cancer ([place]) 1970  . White coat hypertension     History reviewed. No pertinent surgical history.  There were no vitals filed for this visit.      Subjective Assessment - 03/23/16 1236    Subjective I was doing good until I did laundry and vacuumed.   Pertinent History Osteopenia with C4 and C5 compression fractures (2003), scoliosis    Currently in Pain? Yes   Pain Score 3    Pain Location Buttocks   Pain Orientation Right   Pain Descriptors / Indicators Aching;Burning   Pain Onset More than a month ago   Pain Frequency Intermittent   Aggravating Factors  too much activity, housework   Pain Relieving Factors heat, stretching, TENs                         [facility] Adult PT Treatment/Exercise - 03/23/16 0001      Lumbar Exercises: Stretches   Active Hamstring Stretch 3 reps;20 seconds     Lumbar Exercises: Supine   Other Supine Lumbar Exercises yellow theraband horizontal abduction and ER      Lumbar Exercises: Sidelying   Clam 20 reps     Knee/Hip Exercises: Aerobic   Nustep L1 x 6  minutes  PT present to assess status     Moist Heat Therapy   Number Minutes Moist Heat 10 Minutes   Moist Heat Location Lumbar Spine     Manual Therapy   Manual Therapy Soft tissue mobilization   Manual therapy comments soft tissue elongation and trigger point release to Rt gluteals          Trigger Point Dry Needling - 03/23/16 1302    Consent Given? Yes   Muscles Treated Lower Body Gluteus minimus;Gluteus maximus;Piriformis  Rt only   Gluteus Maximus Response Twitch response elicited;Palpable increased muscle length   Gluteus Minimus Response Twitch response elicited;Palpable increased muscle length   Piriformis Response Twitch response elicited;Palpable increased muscle length                PT Short Term Goals - 03/23/16 1256      PT SHORT TERM GOAL #1   Title be independent in initial HEP   Status Achieved     PT SHORT TERM GOAL #2   Title report a 25% reduction in LBP/LE radiculopathy to allow for improved standing tolerance   Status Achieved     PT SHORT TERM GOAL #3   Title demonstrate and  verbalize understanding of correct body mechanics for spinal protection   Status Achieved           PT Long Term Goals - 03/04/16 1226      PT LONG TERM GOAL #1   Title be independent in advanced HEP   Time 8   Period Weeks   Status New     PT LONG TERM GOAL #2   Title reduce FOTO to < or = to 49% limitation   Time 8   Period Weeks   Status New     PT LONG TERM GOAL #3   Title report a 40% reduction in LBP/LE radiculopathy to allow for improved standing tolerance   Time 8   Period Weeks   Status New     PT LONG TERM GOAL #4   Title tolerate standing for 10-12 minutes for housework without need to sit   Time 8   Period Weeks   Status New               Plan - 03/23/16 1256    Clinical Impression Statement Pt is using body mechanics modifications and taking mini breaks when performing housework.  Pt with Rt gluteal pain and trigger points  and demonstrated improved mobility and reduced pain after dry needling today.  Pt has been using the cane intermittently for longer distances to improve posture and endurance.  Pt will continue to benefit from skilled PT for flexiblity, core sttrength and manual/modalities for pain.     Rehab Potential Good   PT Frequency 2x / week   PT Duration 8 weeks   PT Treatment/Interventions ADLs/Self Care Home Management;Cryotherapy;Electrical Stimulation;Functional mobility training;Ultrasound;Moist Heat;Therapeutic activities;Therapeutic exercise;Neuromuscular re-education;Patient/family education;Passive range of motion;Dry needling;Taping   PT Next Visit Plan Scapular stabilization, core strength, flexibility,manual/modalities   Consulted and Agree with Plan of Care Patient      Patient will benefit from skilled therapeutic intervention in order to improve the following deficits and impairments:  Pain, Postural dysfunction, Decreased strength, Impaired flexibility, Improper body mechanics, Decreased activity tolerance, Increased muscle spasms, Decreased endurance, Decreased range of motion, Difficulty walking  Visit Diagnosis: Chronic bilateral low back pain with bilateral sciatica  Abnormal posture  Cramp and spasm  Muscle weakness (generalized)     Problem List There are no active problems to display for this patient.    Sigurd Sos, PT 03/23/16 1:11 PM  [facility] [address], [place], [place], [address] Phone: [phone]   Fax:  [phone]  Name: Jaeliana Glazer MRN: [id number] Date of Birth: Apr 30, 1940

## 2016-03-25 ENCOUNTER — Ambulatory Visit: Payer: Medicare Other | Admitting: Physical Therapy

## 2016-03-25 ENCOUNTER — Encounter: Payer: Self-pay | Admitting: Physical Therapy

## 2016-03-25 DIAGNOSIS — G8929 Other chronic pain: Secondary | ICD-10-CM

## 2016-03-25 DIAGNOSIS — M5442 Lumbago with sciatica, left side: Principal | ICD-10-CM

## 2016-03-25 DIAGNOSIS — M5441 Lumbago with sciatica, right side: Secondary | ICD-10-CM | POA: Diagnosis not present

## 2016-03-25 DIAGNOSIS — R293 Abnormal posture: Secondary | ICD-10-CM | POA: Diagnosis not present

## 2016-03-25 DIAGNOSIS — M6281 Muscle weakness (generalized): Secondary | ICD-10-CM | POA: Diagnosis not present

## 2016-03-25 DIAGNOSIS — R252 Cramp and spasm: Secondary | ICD-10-CM | POA: Diagnosis not present

## 2016-03-25 NOTE — Therapy (Signed)
[facility] [address] [place], [place], [address] Phone: [phone]   Fax:  [phone]  Physical Therapy Treatment  Patient Details  Name: Jacqueline Nash MRN: [id number] Date of Birth: [birth year] Referring Provider: London Pepper, MD  Encounter Date: 03/25/2016      PT End of Session - 03/25/16 1456    Visit Number 7   Number of Visits 10   Date for PT Re-Evaluation 04/29/16   PT Start Time [id number]   PT Stop Time 1530   PT Time Calculation (min) 45 min   Activity Tolerance Patient tolerated treatment well   Behavior During Therapy [facility] for tasks assessed/performed      Past Medical History:  Diagnosis Date  . Cancer ([place])   . Depression   . Hyperlipidemia   . Hypothyroid   . Insomnia   . Migraine   . Osteoporosis   . Peripheral neuropathy ([place])   . Scoliosis   . Thyroid cancer ([place]) 1970  . White coat hypertension     History reviewed. No pertinent surgical history.  There were no vitals filed for this visit.      Subjective Assessment - 03/25/16 1444    Subjective Yesterday I felt so good after Monday appt then I decided to move furniture around: this really bothered my shoulder. Used TENS unit on my shoulder.    Currently in Pain? No/denies   Multiple Pain Sites No                         [facility] Adult PT Treatment/Exercise - 03/25/16 0001      Lumbar Exercises: Stretches   Active Hamstring Stretch 3 reps;20 seconds   Piriformis Stretch 3 reps;20 seconds     Lumbar Exercises: Aerobic   Stationary Bike Nustep L1 x10 min     Lumbar Exercises: Supine   Clam 20 reps;2 seconds  yellow band   Other Supine Lumbar Exercises yellow theraband horizontal abduction and ER   2x10     Lumbar Exercises: Sidelying   Clam --                  PT Short Term Goals - 03/23/16 1256      PT SHORT TERM GOAL #1   Title be independent in initial HEP   Status Achieved     PT SHORT TERM GOAL #2   Title report a 25% reduction in LBP/LE radiculopathy to allow for improved standing tolerance   Status Achieved     PT SHORT TERM GOAL #3   Title demonstrate and verbalize understanding of correct body mechanics for spinal protection   Status Achieved           PT Long Term Goals - 03/04/16 1226      PT LONG TERM GOAL #1   Title be independent in advanced HEP   Time 8   Period Weeks   Status New     PT LONG TERM GOAL #2   Title reduce FOTO to < or = to 49% limitation   Time 8   Period Weeks   Status New     PT LONG TERM GOAL #3   Title report a 40% reduction in LBP/LE radiculopathy to allow for improved standing tolerance   Time 8   Period Weeks   Status New     PT LONG TERM GOAL #4   Title tolerate standing for 10-12 minutes for housework without need  to sit   Time 8   Period Weeks   Status New               Plan - 03/25/16 1457    Clinical Impression Statement Since MOnday pt reports she has felt really good. She also reports the dry needling is her magic potion.  She continues to be independent in her home exercises.  Pain was absent in todays treatment.       Patient will benefit from skilled therapeutic intervention in order to improve the following deficits and impairments:     Visit Diagnosis: Chronic bilateral low back pain with bilateral sciatica  Abnormal posture  Muscle weakness (generalized)     Problem List There are no active problems to display for this patient.   Jacqueline Nash, PTA 03/25/2016, 3:17 PM  [facility] [address], [place], [place], [address] Phone: [phone]   Fax:  [phone]  Name: Jacqueline Nash MRN: [id number] Date of Birth: 02/13/1941

## 2016-03-30 ENCOUNTER — Ambulatory Visit: Payer: Medicare Other

## 2016-03-30 DIAGNOSIS — M5441 Lumbago with sciatica, right side: Secondary | ICD-10-CM | POA: Diagnosis not present

## 2016-03-30 DIAGNOSIS — M6281 Muscle weakness (generalized): Secondary | ICD-10-CM | POA: Diagnosis not present

## 2016-03-30 DIAGNOSIS — R293 Abnormal posture: Secondary | ICD-10-CM | POA: Diagnosis not present

## 2016-03-30 DIAGNOSIS — M5442 Lumbago with sciatica, left side: Principal | ICD-10-CM

## 2016-03-30 DIAGNOSIS — G8929 Other chronic pain: Secondary | ICD-10-CM | POA: Diagnosis not present

## 2016-03-30 DIAGNOSIS — R252 Cramp and spasm: Secondary | ICD-10-CM

## 2016-03-30 NOTE — Therapy (Signed)
[facility] [address] [place], [place], [address] Phone: [phone]   Fax:  [phone]  Physical Therapy Treatment  Patient Details  Name: Jacqueline Nash MRN: [id number] Date of Birth: 11/23/1940 Referring Provider: London Pepper, MD  Encounter Date: 03/30/2016      PT End of Session - 03/30/16 1312    Visit Number 8   Number of Visits 10   Date for PT Re-Evaluation 04/29/16   PT Start Time [id number]   PT Stop Time 1312   PT Time Calculation (min) 41 min   Activity Tolerance Patient tolerated treatment well   Behavior During Therapy [facility] for tasks assessed/performed      Past Medical History:  Diagnosis Date  . Cancer ([place])   . Depression   . Hyperlipidemia   . Hypothyroid   . Insomnia   . Migraine   . Osteoporosis   . Peripheral neuropathy ([place])   . Scoliosis   . Thyroid cancer ([place]) 1970  . White coat hypertension     History reviewed. No pertinent surgical history.  There were no vitals filed for this visit.      Subjective Assessment - 03/30/16 1237    Subjective No pain right now.  I get pain with change of position and this is sometimes unpredictable.  Able to grocery shop without incresed pain.     Pertinent History Osteopenia with C4 and C5 compression fractures (2003), scoliosis    Currently in Pain? No/denies                         [facility] Adult PT Treatment/Exercise - 03/30/16 0001      Lumbar Exercises: Stretches   Active Hamstring Stretch 3 reps;20 seconds   Single Knee to Chest Stretch 3 reps;20 seconds   Piriformis Stretch 3 reps;20 seconds     Lumbar Exercises: Aerobic   Stationary Bike Level 2 x10 minutes  bike not available, PT present to discuss progress     Lumbar Exercises: Supine   Bridge 20 reps     Knee/Hip Exercises: Standing   Hip Abduction Stengthening;Both;2 sets;10 reps  verbal and demo cues for posture   Hip Extension Stengthening;Both;2  sets;10 reps     Manual Therapy   Manual Therapy Soft tissue mobilization   Manual therapy comments soft tissue elongation and trigger point release to Rt gluteals          Trigger Point Dry Needling - 03/30/16 1232    Consent Given? Yes   Muscles Treated Lower Body Gluteus minimus;Gluteus maximus;Piriformis  Rt only   Gluteus Maximus Response Twitch response elicited;Palpable increased muscle length   Gluteus Minimus Response Twitch response elicited;Palpable increased muscle length   Piriformis Response Twitch response elicited;Palpable increased muscle length                PT Short Term Goals - 03/30/16 1238      PT SHORT TERM GOAL #2   Title report a 25% reduction in LBP/LE radiculopathy to allow for improved standing tolerance   Status Achieved     PT SHORT TERM GOAL #3   Title demonstrate and verbalize understanding of correct body mechanics for spinal protection   Status Achieved           PT Long Term Goals - 03/30/16 1238      PT LONG TERM GOAL #1   Title be independent in advanced HEP   Time 8  Period Weeks   Status On-going     PT LONG TERM GOAL #3   Title report a 40% reduction in LBP/LE radiculopathy to allow for improved standing tolerance   Time 8   Period Weeks   Status On-going     PT LONG TERM GOAL #4   Title tolerate standing for 10-12 minutes for housework without need to sit   Time 8   Period Weeks   Status On-going               Plan - 03/30/16 1239    Clinical Impression Statement Pt was able to grocery shop and didn't have increased pain with this activity.  No pain today. Pain does vary with activity and is somewhat unpredictable.  Pt needs to take rest breaks with houswork due to limited standing tolerance.  Pt reports 80% overall improvement since the start of care.  Pt with trigger points in Rt gluteals and lumbar spine and demonstrated improved mobility after dry needling today.  Pt will continue to benefit from  skilled PT for strength, flexibility, manual and modalities for pain management.     Rehab Potential Good   PT Frequency 2x / week   PT Duration 8 weeks   PT Treatment/Interventions ADLs/Self Care Home Management;Cryotherapy;Electrical Stimulation;Functional mobility training;Ultrasound;Moist Heat;Therapeutic activities;Therapeutic exercise;Neuromuscular re-education;Patient/family education;Passive range of motion;Dry needling;Taping   PT Next Visit Plan Scapular stabilization, core strength, flexibility,manual/modalities.  Add standing hip exercises to HEP if tolerated.     Consulted and Agree with Plan of Care Patient      Patient will benefit from skilled therapeutic intervention in order to improve the following deficits and impairments:  Pain, Postural dysfunction, Decreased strength, Impaired flexibility, Improper body mechanics, Decreased activity tolerance, Increased muscle spasms, Decreased endurance, Decreased range of motion, Difficulty walking  Visit Diagnosis: Chronic bilateral low back pain with bilateral sciatica  Abnormal posture  Muscle weakness (generalized)  Cramp and spasm     Problem List There are no active problems to display for this patient.   Sigurd Sos, PT 03/30/16 1:14 PM  [facility] [address] [place], [place], [address] Phone: [phone]   Fax:  [phone]  Name: Jacqueline Nash MRN: [id number] Date of Birth: 1941-02-13

## 2016-04-01 ENCOUNTER — Ambulatory Visit: Payer: Medicare Other

## 2016-04-01 DIAGNOSIS — M5441 Lumbago with sciatica, right side: Principal | ICD-10-CM

## 2016-04-01 DIAGNOSIS — M6281 Muscle weakness (generalized): Secondary | ICD-10-CM

## 2016-04-01 DIAGNOSIS — R252 Cramp and spasm: Secondary | ICD-10-CM

## 2016-04-01 DIAGNOSIS — G8929 Other chronic pain: Secondary | ICD-10-CM | POA: Diagnosis not present

## 2016-04-01 DIAGNOSIS — R293 Abnormal posture: Secondary | ICD-10-CM

## 2016-04-01 DIAGNOSIS — M5442 Lumbago with sciatica, left side: Principal | ICD-10-CM

## 2016-04-01 NOTE — Patient Instructions (Addendum)
    ABDUCTION: Standing (Active)   Stand, feet flat. Lift right leg out to side. Use _0__ lbs. Complete _2x_10_ repetitions. Perform __2_ sessions per day.    EXTENSION: Standing (Active)  Stand, both feet flat. Draw right leg behind body as far as possible. Use 0___ lbs. Complete 2x  10 repetitions. Perform __2_ sessions per day.  Copyright  VHI. All rights reserved.   HIP / KNEE: Extension - Sit to Stand    Sitting, lean chest forward, raise hips up from surface. Straighten hips and knees. Weight bear equally on left and right sides. Backs of legs should not push off surface. _10__ reps per set, _2-3__ sets per day, ___ Copyright  VHI. All rights reserved.  [facility] [address], [place], [place] [address] Phone # [phone] Fax [phone]

## 2016-04-01 NOTE — Therapy (Signed)
[facility] [address], [place], [place], [address] Phone: [phone]   Fax:  [phone]  Physical Therapy Treatment  Patient Details  Name: Jacqueline Nash MRN: [id number] Date of Birth: [birth year] Referring Provider: London Pepper, MD  Encounter Date: 04/01/2016      PT End of Session - 04/01/16 1307    Visit Number 9   Number of Visits 10   Date for PT Re-Evaluation 04/29/16   PT Start Time [id number]   PT Stop Time 1309   PT Time Calculation (min) 39 min   Activity Tolerance Patient tolerated treatment well   Behavior During Therapy [facility] for tasks assessed/performed      Past Medical History:  Diagnosis Date  . Cancer ([place])   . Depression   . Hyperlipidemia   . Hypothyroid   . Insomnia   . Migraine   . Osteoporosis   . Peripheral neuropathy ([place])   . Scoliosis   . Thyroid cancer ([place]) 1970  . White coat hypertension     No past surgical history on file.  There were no vitals filed for this visit.      Subjective Assessment - 04/01/16 1232    Subjective I'm doing good. I sat through a movie.     Pertinent History Osteopenia with C4 and C5 compression fractures (2003), scoliosis    Patient Stated Goals walk, lift and carry with less pain.   Currently in Pain? No/denies                         [facility] Adult PT Treatment/Exercise - 04/01/16 0001      Lumbar Exercises: Stretches   Active Hamstring Stretch 3 reps;20 seconds   Single Knee to Chest Stretch 3 reps;20 seconds   Piriformis Stretch 3 reps;20 seconds     Lumbar Exercises: Aerobic   Stationary Bike NuStep:  Level 2 x10 minutes  , PT present to discuss progress     Lumbar Exercises: Seated   Sit to Stand 20 reps     Lumbar Exercises: Supine   Bridge 20 reps     Knee/Hip Exercises: Standing   Hip Abduction Stengthening;Both;2 sets;10 reps  verbal and demo cues for posture   Hip Extension Stengthening;Both;2  sets;10 reps   Rebounder weight shifting 3 ways: 1 minute each                PT Education - 04/01/16 1255    Education provided Yes   Education Details sit to stand, hip abduction and extension   Person(s) Educated Patient   Methods Explanation;Demonstration;Handout   Comprehension Verbalized understanding;Returned demonstration          PT Short Term Goals - 03/30/16 1238      PT SHORT TERM GOAL #2   Title report a 25% reduction in LBP/LE radiculopathy to allow for improved standing tolerance   Status Achieved     PT SHORT TERM GOAL #3   Title demonstrate and verbalize understanding of correct body mechanics for spinal protection   Status Achieved           PT Long Term Goals - 03/30/16 1238      PT LONG TERM GOAL #1   Title be independent in advanced HEP   Time 8   Period Weeks   Status On-going     PT LONG TERM GOAL #3   Title report a 40% reduction in LBP/LE radiculopathy to allow for improved  standing tolerance   Time 8   Period Weeks   Status On-going     PT LONG TERM GOAL #4   Title tolerate standing for 10-12 minutes for housework without need to sit   Time 8   Period Weeks   Status On-going               Plan - 04/01/16 1242    Clinical Impression Statement Pt reports 80% overall improvement since the start of care.  Pt without pain over the past few days.  Pt was able to bake cookies with use of mini breaks yesterday without increased pain.  Focus today was on strength, posture and flexiblity today.  Pt will continue to benefit from skilled PT for strength, flexibility, manual and modalities for pain management.     Rehab Potential Good   PT Frequency 2x / week   PT Duration 8 weeks   PT Treatment/Interventions ADLs/Self Care Home Management;Cryotherapy;Electrical Stimulation;Functional mobility training;Ultrasound;Moist Heat;Therapeutic activities;Therapeutic exercise;Neuromuscular re-education;Patient/family education;Passive range  of motion;Dry needling;Taping   PT Next Visit Plan Scapular stabilization, core strength, flexibility,manual/modalities.  Add standing hip exercises to HEP if tolerated.     Consulted and Agree with Plan of Care Patient      Patient will benefit from skilled therapeutic intervention in order to improve the following deficits and impairments:  Pain, Postural dysfunction, Decreased strength, Impaired flexibility, Improper body mechanics, Decreased activity tolerance, Increased muscle spasms, Decreased endurance, Decreased range of motion, Difficulty walking  Visit Diagnosis: Chronic bilateral low back pain with bilateral sciatica  Abnormal posture  Muscle weakness (generalized)  Cramp and spasm     Problem List There are no active problems to display for this patient.    Sigurd Sos, PT 04/01/16 1:09 PM  [facility] [address] [place], [place], [address] Phone: [phone]   Fax:  [phone]  Name: Deby Perrier MRN: [id number] Date of Birth: [birth year]

## 2016-04-08 ENCOUNTER — Ambulatory Visit: Payer: Medicare Other

## 2016-04-08 DIAGNOSIS — R293 Abnormal posture: Secondary | ICD-10-CM

## 2016-04-08 DIAGNOSIS — M5441 Lumbago with sciatica, right side: Secondary | ICD-10-CM | POA: Diagnosis not present

## 2016-04-08 DIAGNOSIS — G8929 Other chronic pain: Secondary | ICD-10-CM

## 2016-04-08 DIAGNOSIS — M5442 Lumbago with sciatica, left side: Secondary | ICD-10-CM | POA: Diagnosis not present

## 2016-04-08 DIAGNOSIS — R252 Cramp and spasm: Secondary | ICD-10-CM | POA: Diagnosis not present

## 2016-04-08 DIAGNOSIS — M6281 Muscle weakness (generalized): Secondary | ICD-10-CM | POA: Diagnosis not present

## 2016-04-08 NOTE — Therapy (Signed)
[facility] [address], [place], [place], [address] Phone: [phone]   Fax:  [phone]  Physical Therapy Treatment  Patient Details  Name: Jacqueline Nash MRN: [id number] Date of Birth: Oct 17, 1940 Referring Provider: London Pepper, MD  Encounter Date: 04/08/2016      PT End of Session - 04/08/16 1309    Visit Number 10   Number of Visits 20   Date for PT Re-Evaluation 04/29/16   PT Start Time [id number]   PT Stop Time 1311   PT Time Calculation (min) 40 min   Activity Tolerance Patient tolerated treatment well   Behavior During Therapy [facility] for tasks assessed/performed      Past Medical History:  Diagnosis Date  . Cancer ([place])   . Depression   . Hyperlipidemia   . Hypothyroid   . Insomnia   . Migraine   . Osteoporosis   . Peripheral neuropathy ([place])   . Scoliosis   . Thyroid cancer ([place]) 1970  . White coat hypertension     History reviewed. No pertinent surgical history.  There were no vitals filed for this visit.          [facility] PT Assessment - 04/08/16 0001      Observation/Other Assessments   Observations 57% limitation                     [facility] Adult PT Treatment/Exercise - 04/08/16 0001      Lumbar Exercises: Stretches   Active Hamstring Stretch 3 reps;20 seconds   Single Knee to Chest Stretch 3 reps;20 seconds   Piriformis Stretch 3 reps;20 seconds     Lumbar Exercises: Aerobic   Stationary Bike NuStep:  Level 2 x10 minutes  , PT present to discuss progress     Lumbar Exercises: Seated   Sit to Stand 20 reps     Lumbar Exercises: Supine   Bridge 20 reps     Knee/Hip Exercises: Standing   Hip Abduction Stengthening;Both;2 sets;10 reps  verbal and demo cues for posture   Hip Extension Stengthening;Both;2 sets;10 reps   Rebounder weight shifting 3 ways: 1 minute each                  PT Short Term Goals - 03/30/16 1238      PT SHORT TERM GOAL #2   Title report a 25% reduction in LBP/LE radiculopathy to allow for improved standing tolerance   Status Achieved     PT SHORT TERM GOAL #3   Title demonstrate and verbalize understanding of correct body mechanics for spinal protection   Status Achieved           PT Long Term Goals - 04/08/16 1236      PT LONG TERM GOAL #1   Title be independent in advanced HEP   Time 8   Period Weeks   Status On-going     PT LONG TERM GOAL #2   Title reduce FOTO to < or = to 49% limitation   Time 8   Period Weeks   Status On-going     PT LONG TERM GOAL #3   Title report a 40% reduction in LBP/LE radiculopathy to allow for improved standing tolerance   Time 8   Period Weeks   Status On-going     PT LONG TERM GOAL #4   Title tolerate standing for 10-12 minutes for housework without need to sit   Time 8   Period Weeks  Status On-going               Plan - 2016-04-13 1236    Clinical Impression Statement Pt was active over the Christmas and didn't have as much time to stretch.  Pt is using mini breaks with long periods of standing.  No significant change in symptoms since being more active over the Christmas holiday.  Pt with significant scoliosis and muscular imbalances and will continue to benefit from skilled PT for strength, flexiblity and manual/modalities as needed.     Rehab Potential Good   PT Frequency 2x / week   PT Duration 8 weeks   PT Treatment/Interventions ADLs/Self Care Home Management;Cryotherapy;Electrical Stimulation;Functional mobility training;Ultrasound;Moist Heat;Therapeutic activities;Therapeutic exercise;Neuromuscular re-education;Patient/family education;Passive range of motion;Dry needling;Taping   PT Next Visit Plan Scapular stabilization, core strength, flexibility,manual/modalities.      Consulted and Agree with Plan of Care Patient      Patient will benefit from skilled therapeutic intervention in order to improve the following deficits and  impairments:  Pain, Postural dysfunction, Decreased strength, Impaired flexibility, Improper body mechanics, Decreased activity tolerance, Increased muscle spasms, Decreased endurance, Decreased range of motion, Difficulty walking  Visit Diagnosis: Chronic bilateral low back pain with bilateral sciatica  Abnormal posture  Muscle weakness (generalized)  Cramp and spasm       G-Codes - 04-13-16 1235    Functional Assessment Tool Used FOTO: 57% limitation   Functional Limitation Mobility: Walking and moving around   Mobility: Walking and Moving Around Current Status [phone]) At least 40 percent but less than 60 percent impaired, limited or restricted   Mobility: Walking and Moving Around Goal Status [phone]) At least 40 percent but less than 60 percent impaired, limited or restricted      Problem List There are no active problems to display for this patient.    Sigurd Sos, PT April 13, 2016 1:17 PM  [facility] [address] [place], [place], [address] Phone: [phone]   Fax:  [phone]  Name: Jacqueline Nash MRN: [id number] Date of Birth: [birth year]

## 2016-04-10 ENCOUNTER — Encounter: Payer: Self-pay | Admitting: Physical Therapy

## 2016-04-10 ENCOUNTER — Ambulatory Visit: Payer: Medicare Other | Admitting: Physical Therapy

## 2016-04-10 DIAGNOSIS — M6281 Muscle weakness (generalized): Secondary | ICD-10-CM | POA: Diagnosis not present

## 2016-04-10 DIAGNOSIS — M5442 Lumbago with sciatica, left side: Principal | ICD-10-CM

## 2016-04-10 DIAGNOSIS — R252 Cramp and spasm: Secondary | ICD-10-CM | POA: Diagnosis not present

## 2016-04-10 DIAGNOSIS — G8929 Other chronic pain: Secondary | ICD-10-CM

## 2016-04-10 DIAGNOSIS — M5441 Lumbago with sciatica, right side: Principal | ICD-10-CM

## 2016-04-10 DIAGNOSIS — R293 Abnormal posture: Secondary | ICD-10-CM | POA: Diagnosis not present

## 2016-04-10 NOTE — Therapy (Signed)
[facility] [address], [place], [place], [address] Phone: [phone]   Fax:  [phone]  Physical Therapy Treatment  Patient Details  Name: Jacqueline Nash MRN: [id number] Date of Birth: [birth year] Referring Provider: London Pepper, MD  Encounter Date: 04/10/2016      PT End of Session - 04/10/16 1059    Visit Number 11   Number of Visits 20   Date for PT Re-Evaluation 04/29/16   PT Start Time 1059   PT Stop Time 1205   PT Time Calculation (min) 66 min   Activity Tolerance Patient tolerated treatment well   Behavior During Therapy [facility] for tasks assessed/performed      Past Medical History:  Diagnosis Date  . Cancer ([place])   . Depression   . Hyperlipidemia   . Hypothyroid   . Insomnia   . Migraine   . Osteoporosis   . Peripheral neuropathy ([place])   . Scoliosis   . Thyroid cancer ([place]) 1970  . White coat hypertension     History reviewed. No pertinent surgical history.  There were no vitals filed for this visit.      Subjective Assessment - 04/10/16 1103    Subjective states she is more aware of how she is twisting and turning that causes pain which is making her able to manage her pain.     Pertinent History Osteopenia with C4 and C5 compression fractures (2003), scoliosis    Limitations Standing;Walking;Sitting   How long can you stand comfortably? LE radiculopathy- limited to 5-10   How long can you walk comfortably? < 5 minutes   Diagnostic tests none recent   Patient Stated Goals walk, lift and carry with less pain.   Currently in Pain? No/denies                         [facility] Adult PT Treatment/Exercise - 04/10/16 0001      Lumbar Exercises: Aerobic   Stationary Bike NuStep:  Level 2 x10 minutes  , PT present to discuss progress     Lumbar Exercises: Standing   Row Strengthening;Power tower;Both;20 reps  15#, sitting on blue   Shoulder Extension Strengthening;Power  Tower;Both;20 reps  1 plate, sitting on blue physioball   Other Standing Lumbar Exercises lat pull down power tower sitting on ball  - 15# x20     Lumbar Exercises: Seated   Sit to Stand 10 reps  VC for no knee collapse and core/glute engagemment     Lumbar Exercises: Supine   Bridge 20 reps  yellow band around knees     Lumbar Exercises: Sidelying   Clam 20 reps     Moist Heat Therapy   Number Minutes Moist Heat 10 Minutes  lumbar spine     Manual Therapy   Manual Therapy Soft tissue mobilization   Soft tissue mobilization Lt glutes and QL                PT Education - 04/10/16 1146    Education provided Yes   Education Details HEP as seen i chart   Person(s) Educated Patient   Methods Explanation;Verbal cues;Handout   Comprehension Verbalized understanding          PT Short Term Goals - 03/30/16 1238      PT SHORT TERM GOAL #2   Title report a 25% reduction in LBP/LE radiculopathy to allow for improved standing tolerance   Status Achieved  PT SHORT TERM GOAL #3   Title demonstrate and verbalize understanding of correct body mechanics for spinal protection   Status Achieved           PT Long Term Goals - 04/08/16 1236      PT LONG TERM GOAL #1   Title be independent in advanced HEP   Time 8   Period Weeks   Status On-going     PT LONG TERM GOAL #2   Title reduce FOTO to < or = to 49% limitation   Time 8   Period Weeks   Status On-going     PT LONG TERM GOAL #3   Title report a 40% reduction in LBP/LE radiculopathy to allow for improved standing tolerance   Time 8   Period Weeks   Status On-going     PT LONG TERM GOAL #4   Title tolerate standing for 10-12 minutes for housework without need to sit   Time 8   Period Weeks   Status On-going               Plan - 04/10/16 1157    Clinical Impression Statement Pt had muscle tightness in Lt glutes and QL that eased with manual.  Pt able to perform exercises with emphasis on  core engagement and posture.  Had some pain with side stepping but eased immediately with rest.  Added exercises to HEP.  Continue strengthening and scapulat stability with focus on posture.   Rehab Potential Good   PT Frequency 2x / week   PT Duration 8 weeks   PT Treatment/Interventions ADLs/Self Care Home Management;Cryotherapy;Electrical Stimulation;Functional mobility training;Ultrasound;Moist Heat;Therapeutic activities;Therapeutic exercise;Neuromuscular re-education;Patient/family education;Passive range of motion;Dry needling;Taping   PT Next Visit Plan Scapular stabilization, core and glute strength, flexibility,manual/modalities.      PT Home Exercise Plan progress as needed   Consulted and Agree with Plan of Care Patient      Patient will benefit from skilled therapeutic intervention in order to improve the following deficits and impairments:  Pain, Postural dysfunction, Decreased strength, Impaired flexibility, Improper body mechanics, Decreased activity tolerance, Increased muscle spasms, Decreased endurance, Decreased range of motion, Difficulty walking  Visit Diagnosis: Chronic bilateral low back pain with bilateral sciatica  Abnormal posture  Muscle weakness (generalized)  Cramp and spasm     Problem List There are no active problems to display for this patient.   Zannie Cove, PT 04/10/2016, 12:07 PM  [facility] [address] [place], [place], [address] Phone: [phone]   Fax:  [phone]  Name: Aline Welde MRN: [id number] Date of Birth: 1940/07/04

## 2016-04-10 NOTE — Patient Instructions (Signed)
Abduction: Clam (Eccentric) - Side-Lying    Lie on side with knees bent. Lift top knee, keeping feet together. Keep trunk steady. Slowly lower for 3-5 seconds. _20__ reps per set, __1_ sets per day, _5__ days per week. .  http://ecce.exer.us/65   Copyright  VHI. All rights reserved.  (Clinic) Extension / Flexion (Assist)    Face pulley, right arm as far forward and up as is pain free. Pull arm down toward side. Repeat __20__ times per set. Do _1___ sets per session. Do __5__ sessions per week.   Copyright  VHI. All rights reserved.  (Clinic) Retraction: Row - Bilateral (Pulley)    Facing pulley, arms reaching forward, pull hands toward stomach, pinching shoulder blades together. Repeat ___20_ times per set. Do __1__ sets per session. Do _5__ sessions per week. Use ____ lb weights.  Copyright  VHI. All rights reserved.  Bridge    Lie back, legs bent. Inhale, pressing hips up. Keeping ribs in, lengthen lower back. Exhale, rolling down along spine from top.  Keep feet and knees in straight line and hip width apart, heels close to your bottom and pushing down through heels. Tie band around your knees Repeat _20___ times. Do __1__ sessions per day.  http://pm.exer.us/55   Copyright  VHI. All rights reserved.   [place] [address] [place], [place] [address] Phone # [phone] Fax [phone]

## 2016-04-15 ENCOUNTER — Encounter: Payer: Self-pay | Admitting: Physical Therapy

## 2016-04-15 ENCOUNTER — Ambulatory Visit: Payer: Medicare Other | Attending: Family Medicine | Admitting: Physical Therapy

## 2016-04-15 DIAGNOSIS — M5442 Lumbago with sciatica, left side: Secondary | ICD-10-CM

## 2016-04-15 DIAGNOSIS — G8929 Other chronic pain: Secondary | ICD-10-CM | POA: Diagnosis not present

## 2016-04-15 DIAGNOSIS — M6281 Muscle weakness (generalized): Secondary | ICD-10-CM

## 2016-04-15 DIAGNOSIS — M5441 Lumbago with sciatica, right side: Secondary | ICD-10-CM

## 2016-04-15 DIAGNOSIS — R293 Abnormal posture: Secondary | ICD-10-CM

## 2016-04-15 DIAGNOSIS — R252 Cramp and spasm: Secondary | ICD-10-CM

## 2016-04-15 NOTE — Therapy (Signed)
[facility] [address] [place], [place], [address] Phone: [phone]   Fax:  [phone]  Physical Therapy Treatment  Patient Details  Name: Jacqueline Nash MRN: [id number] Date of Birth: 10-16-1940 Referring Provider: London Pepper, MD  Encounter Date: 04/15/2016      PT End of Session - 04/15/16 1536    Visit Number 12   Number of Visits 20   Date for PT Re-Evaluation 04/29/16   PT Start Time [id number]   PT Stop Time 1615   PT Time Calculation (min) 41 min   Activity Tolerance Patient tolerated treatment well   Behavior During Therapy [facility] for tasks assessed/performed      Past Medical History:  Diagnosis Date  . Cancer ([place])   . Depression   . Hyperlipidemia   . Hypothyroid   . Insomnia   . Migraine   . Osteoporosis   . Peripheral neuropathy ([place])   . Scoliosis   . Thyroid cancer ([place]) 1970  . White coat hypertension     History reviewed. No pertinent surgical history.  There were no vitals filed for this visit.      Subjective Assessment - 04/15/16 1539    Subjective States she is having good days and bad days.  Sunday she states she walked around the building at church and her Lt side started hurting a lot.  Felt better after heat and ice.     Pertinent History Osteopenia with C4 and C5 compression fractures (2003), scoliosis    Limitations Standing;Walking;Sitting   How long can you sit comfortably? no limitations   How long can you stand comfortably? LE radiculopathy- limited to 5-10   How long can you walk comfortably? < 5 minutes   Diagnostic tests none recent   Patient Stated Goals walk, lift and carry with less pain.   Currently in Pain? No/denies                         [facility] Adult PT Treatment/Exercise - 04/15/16 0001      Lumbar Exercises: Stretches   Piriformis Stretch 3 reps;20 seconds     Lumbar Exercises: Aerobic   Stationary Bike NuStep:  Level 2 x10 minutes   , PT present to discuss progress     Lumbar Exercises: Standing   Row Strengthening;Power tower;Both;20 reps  15#, sitting on blue   Shoulder Extension Strengthening;Right;20 reps;Theraband;Left  single UE for anti-rotational core   Theraband Level (Shoulder Extension) Level 2 (Red)   Other Standing Lumbar Exercises lat pull down power tower sitting on ball  - 15# x20   Other Standing Lumbar Exercises standing side steps while holding green band - 15x     Lumbar Exercises: Seated   Sit to Stand 10 reps  VC for form, red band around knees     Lumbar Exercises: Supine   Clam 20 reps;2 seconds  red band                  PT Short Term Goals - 03/30/16 1238      PT SHORT TERM GOAL #2   Title report a 25% reduction in LBP/LE radiculopathy to allow for improved standing tolerance   Status Achieved     PT SHORT TERM GOAL #3   Title demonstrate and verbalize understanding of correct body mechanics for spinal protection   Status Achieved           PT Long Term Goals -  04/15/16 1723      PT LONG TERM GOAL #1   Title be independent in advanced HEP   Time 8   Period Weeks   Status On-going     PT LONG TERM GOAL #3   Title report a 40% reduction in LBP/LE radiculopathy to allow for improved standing tolerance   Time 8   Period Weeks   Status On-going               Plan - 04/15/16 1538    Clinical Impression Statement Pt had difficulty with oblique isometric exercises and became fatigued.  Rt low back began getting more sore with fatigue.  Pt did well with exercises and did not report any increased shoulder pain throughout.  Needs strengthening for improved endurance for functional activities.   Rehab Potential Good   PT Frequency 2x / week   PT Duration 8 weeks   PT Treatment/Interventions ADLs/Self Care Home Management;Cryotherapy;Electrical Stimulation;Functional mobility training;Ultrasound;Moist Heat;Therapeutic activities;Therapeutic  exercise;Neuromuscular re-education;Patient/family education;Passive range of motion;Dry needling;Taping   PT Next Visit Plan Scapular stabilization, core and glute strength, flexibility,manual/modalities.      PT Home Exercise Plan progress as needed   Consulted and Agree with Plan of Care Patient      Patient will benefit from skilled therapeutic intervention in order to improve the following deficits and impairments:  Pain, Postural dysfunction, Decreased strength, Impaired flexibility, Improper body mechanics, Decreased activity tolerance, Increased muscle spasms, Decreased endurance, Decreased range of motion, Difficulty walking  Visit Diagnosis: Chronic bilateral low back pain with bilateral sciatica  Abnormal posture  Muscle weakness (generalized)  Cramp and spasm     Problem List There are no active problems to display for this patient.   Zannie Cove, PT 04/15/2016, 5:25 PM  [facility] [address] [place], [place], [address] Phone: [phone]   Fax:  [phone]  Name: Jacqueline Nash MRN: [id number] Date of Birth: Feb 13, 1941

## 2016-04-17 ENCOUNTER — Ambulatory Visit: Payer: Medicare Other | Admitting: Physical Therapy

## 2016-04-17 ENCOUNTER — Encounter: Payer: Self-pay | Admitting: Physical Therapy

## 2016-04-17 DIAGNOSIS — R293 Abnormal posture: Secondary | ICD-10-CM

## 2016-04-17 DIAGNOSIS — G8929 Other chronic pain: Secondary | ICD-10-CM

## 2016-04-17 DIAGNOSIS — M5442 Lumbago with sciatica, left side: Principal | ICD-10-CM

## 2016-04-17 DIAGNOSIS — M6281 Muscle weakness (generalized): Secondary | ICD-10-CM

## 2016-04-17 DIAGNOSIS — R252 Cramp and spasm: Secondary | ICD-10-CM

## 2016-04-17 DIAGNOSIS — M5441 Lumbago with sciatica, right side: Principal | ICD-10-CM

## 2016-04-17 NOTE — Patient Instructions (Signed)
Serratus punch lying on your back - arm to the ceiling and lift shoulder and back down - 20x with small weight

## 2016-04-17 NOTE — Therapy (Signed)
[facility] [address], [place], [place], [address] Phone: [phone]   Fax:  [phone]  Physical Therapy Treatment  Patient Details  Name: Jacqueline Nash MRN: [id number] Date of Birth: 22-Mar-1941 Referring Provider: London Pepper, MD  Encounter Date: 04/17/2016      PT End of Session - 04/17/16 1059    Visit Number 13   Number of Visits 20   Date for PT Re-Evaluation 04/29/16   PT Start Time [id number]   PT Stop Time 1153   PT Time Calculation (min) 55 min   Activity Tolerance Patient tolerated treatment well   Behavior During Therapy [facility] for tasks assessed/performed      Past Medical History:  Diagnosis Date  . Cancer ([place])   . Depression   . Hyperlipidemia   . Hypothyroid   . Insomnia   . Migraine   . Osteoporosis   . Peripheral neuropathy ([place])   . Scoliosis   . Thyroid cancer ([place]) 1970  . White coat hypertension     History reviewed. No pertinent surgical history.  There were no vitals filed for this visit.      Subjective Assessment - 04/17/16 1102    Subjective States her Lt shoulder was throbbing yesterday since the exercises the other day.   Pertinent History Osteopenia with C4 and C5 compression fractures (2003), scoliosis    Limitations Standing;Walking;Sitting   How long can you sit comfortably? no limitations   How long can you stand comfortably? LE radiculopathy- limited to 5-10   How long can you walk comfortably? < 5 minutes   Diagnostic tests none recent   Patient Stated Goals walk, lift and carry with less pain.   Currently in Pain? No/denies                         [facility] Adult PT Treatment/Exercise - 04/17/16 0001      Self-Care   Self-Care Other Self-Care Comments   Other Self-Care Comments  education in core and shoulder stability in sitting and doing check in for core bracing, scap squeezes and cervical retraction for improved spine lengthening and  postural muscle activation     Lumbar Exercises: Aerobic   Stationary Bike NuStep:  Level 2 x10 minutes  , PT present to discuss progress     Lumbar Exercises: Sidelying   Clam 20 reps  red band   Other Sidelying Lumbar Exercises shouler external rotation with 1lb - 20x     Lumbar Exercises: Prone   Other Prone Lumbar Exercises core bracing with Lt shoulder purtubartions red band all directions   Other Prone Lumbar Exercises serratus punches 3lb - 10 x core and scapula stability     Knee/Hip Exercises: Machines for Strengthening   Other Machine forward, backward, sideways walking - 15# 5 x each     Manual Therapy   Manual Therapy Soft tissue mobilization;Joint mobilization   Manual therapy comments tender to palpation supraspinatus attachment   Joint Mobilization A/P mob to Lt shoulder   Soft tissue mobilization pec minor and major, anterior delt                PT Education - 04/17/16 1156    Education provided Yes   Education Details serratus punches   Person(s) Educated Patient   Methods Explanation;Verbal cues;Handout;Tactile cues   Comprehension Verbalized understanding          PT Short Term Goals - 03/30/16 1238  PT SHORT TERM GOAL #2   Title report a 25% reduction in LBP/LE radiculopathy to allow for improved standing tolerance   Status Achieved     PT SHORT TERM GOAL #3   Title demonstrate and verbalize understanding of correct body mechanics for spinal protection   Status Achieved           PT Long Term Goals - 04/17/16 1142      PT LONG TERM GOAL #4   Title tolerate standing for 10-12 minutes for housework without need to sit   Baseline tries not to just stand when doing housework - incorporates exercises and is having less pain   Time 8   Period Weeks   Status On-going               Plan - 04/17/16 1200    Clinical Impression Statement Pt demonstrates progress with improved posture.  Shoulder flare ups are making  progression of core exercises more difficult.  Working on more gentle exercises for shoulder stability in order to translate to improved stability without upper body work flaring up the shoulder.  Pt was challenged with walking exercises with resistence needed cues for taking more even steps with upright posture.  Pt educated in abdominal bracing and scap squeezes and improved head positioning in sitting for more long term sitting positions.  Pt continues to need postural strengthening but is making progress with being able to complete chores with less pain.   Rehab Potential Good   PT Frequency 2x / week   PT Duration 8 weeks   PT Treatment/Interventions ADLs/Self Care Home Management;Cryotherapy;Electrical Stimulation;Functional mobility training;Ultrasound;Moist Heat;Therapeutic activities;Therapeutic exercise;Neuromuscular re-education;Patient/family education;Passive range of motion;Dry needling;Taping   PT Next Visit Plan continue scapular stabiliazation for overall improved posture, manual as needed, glute and core strength   Consulted and Agree with Plan of Care Patient      Patient will benefit from skilled therapeutic intervention in order to improve the following deficits and impairments:  Pain, Postural dysfunction, Decreased strength, Impaired flexibility, Improper body mechanics, Decreased activity tolerance, Increased muscle spasms, Decreased endurance, Decreased range of motion, Difficulty walking  Visit Diagnosis: Chronic bilateral low back pain with bilateral sciatica  Abnormal posture  Muscle weakness (generalized)  Cramp and spasm     Problem List There are no active problems to display for this patient.   Zannie Cove, PT 04/17/2016, 12:13 PM   [facility] [address], [place], [place], [address] Phone: [phone]   Fax:  [phone]  Name: Jacqueline Nash MRN: [id number] Date of Birth:  Aug 15, 1940

## 2016-04-20 ENCOUNTER — Ambulatory Visit: Payer: Medicare Other

## 2016-04-20 DIAGNOSIS — G8929 Other chronic pain: Secondary | ICD-10-CM | POA: Diagnosis not present

## 2016-04-20 DIAGNOSIS — M6281 Muscle weakness (generalized): Secondary | ICD-10-CM

## 2016-04-20 DIAGNOSIS — M5442 Lumbago with sciatica, left side: Secondary | ICD-10-CM | POA: Diagnosis not present

## 2016-04-20 DIAGNOSIS — R293 Abnormal posture: Secondary | ICD-10-CM

## 2016-04-20 DIAGNOSIS — M5441 Lumbago with sciatica, right side: Principal | ICD-10-CM

## 2016-04-20 DIAGNOSIS — R252 Cramp and spasm: Secondary | ICD-10-CM | POA: Diagnosis not present

## 2016-04-20 NOTE — Therapy (Signed)
[facility] [address], [place], [place], [address] Phone: [phone]   Fax:  [phone]  Physical Therapy Treatment  Patient Details  Name: Jacqueline Nash MRN: [id number] Date of Birth: 22-May-1940 Referring Provider: London Pepper, MD  Encounter Date: 04/20/2016      PT End of Session - 04/20/16 1227    Visit Number 14   Number of Visits 20   Date for PT Re-Evaluation 04/29/16   PT Start Time [id number]   PT Stop Time 1227   PT Time Calculation (min) 42 min   Activity Tolerance Patient tolerated treatment well   Behavior During Therapy [facility] for tasks assessed/performed      Past Medical History:  Diagnosis Date  . Cancer ([place])   . Depression   . Hyperlipidemia   . Hypothyroid   . Insomnia   . Migraine   . Osteoporosis   . Peripheral neuropathy ([place])   . Scoliosis   . Thyroid cancer ([place]) 1970  . White coat hypertension     History reviewed. No pertinent surgical history.  There were no vitals filed for this visit.      Subjective Assessment - 04/20/16 1153    Subjective I'm doing better overall.     Patient Stated Goals walk, lift and carry with less pain.   Currently in Pain? Yes                         [facility] Adult PT Treatment/Exercise - 04/20/16 0001      Lumbar Exercises: Aerobic   Stationary Bike Level 2 x10 minutes  , PT present to discuss progress     Lumbar Exercises: Sidelying   Clam 20 reps  red band   Other Sidelying Lumbar Exercises shouler external rotation with 1lb - 20x     Lumbar Exercises: Prone   Other Prone Lumbar Exercises core bracing with Lt shoulder purtubartions red band all directions   Other Prone Lumbar Exercises serratus punches 3lb - 10 x core and scapula stability     Knee/Hip Exercises: Machines for Strengthening   Other Machine Resisted walking: forward, backward, sideways walking - 15# 5 x each                  PT Short Term  Goals - 03/30/16 1238      PT SHORT TERM GOAL #2   Title report a 25% reduction in LBP/LE radiculopathy to allow for improved standing tolerance   Status Achieved     PT SHORT TERM GOAL #3   Title demonstrate and verbalize understanding of correct body mechanics for spinal protection   Status Achieved           PT Long Term Goals - 04/20/16 1158      PT LONG TERM GOAL #1   Title be independent in advanced HEP   Time 8   Period Weeks   Status On-going     PT LONG TERM GOAL #2   Title reduce FOTO to < or = to 49% limitation   Time 8   Period Weeks   Status On-going     PT LONG TERM GOAL #3   Title report a 40% reduction in LBP/LE radiculopathy to allow for improved standing tolerance   Status Achieved     PT LONG TERM GOAL #4   Title tolerate standing for 10-12 minutes for housework without need to sit   Time 8   Period  Weeks   Status On-going               Plan - 04/20/16 1207    Clinical Impression Statement Pt reports 90% overall reduction in pain since the start of care.  She modifies activity at home and takes rest breaks at home with home activity.  Pt with Lt shoulder pain that limits some activity in the clinic.  Pt requires verbal cues for core stabilization with exercises.  Pt will benefit from 2-3 more sessions to finalize HEP for core.     Rehab Potential Good   PT Frequency 2x / week   PT Duration 8 weeks   PT Treatment/Interventions ADLs/Self Care Home Management;Cryotherapy;Electrical Stimulation;Functional mobility training;Ultrasound;Moist Heat;Therapeutic activities;Therapeutic exercise;Neuromuscular re-education;Patient/family education;Passive range of motion;Dry needling;Taping   PT Next Visit Plan continue scapular stabiliazation for overall improved posture, manual as needed, glute and core strength   Consulted and Agree with Plan of Care Patient      Patient will benefit from skilled therapeutic intervention in order to improve the  following deficits and impairments:  Pain, Postural dysfunction, Decreased strength, Impaired flexibility, Improper body mechanics, Decreased activity tolerance, Increased muscle spasms, Decreased endurance, Decreased range of motion, Difficulty walking  Visit Diagnosis: Chronic bilateral low back pain with bilateral sciatica  Abnormal posture  Muscle weakness (generalized)     Problem List There are no active problems to display for this patient.    Sigurd Sos, PT 04/20/16 12:29 PM  [facility] [address], [place], [place], [address] Phone: [phone]   Fax:  [phone]  Name: Jacqueline Nash MRN: [id number] Date of Birth: 05-Jul-1940

## 2016-04-22 ENCOUNTER — Ambulatory Visit: Payer: Medicare Other

## 2016-04-22 DIAGNOSIS — M5441 Lumbago with sciatica, right side: Secondary | ICD-10-CM | POA: Diagnosis not present

## 2016-04-22 DIAGNOSIS — R293 Abnormal posture: Secondary | ICD-10-CM

## 2016-04-22 DIAGNOSIS — R252 Cramp and spasm: Secondary | ICD-10-CM | POA: Diagnosis not present

## 2016-04-22 DIAGNOSIS — M5442 Lumbago with sciatica, left side: Secondary | ICD-10-CM | POA: Diagnosis not present

## 2016-04-22 DIAGNOSIS — G8929 Other chronic pain: Secondary | ICD-10-CM

## 2016-04-22 DIAGNOSIS — M6281 Muscle weakness (generalized): Secondary | ICD-10-CM | POA: Diagnosis not present

## 2016-04-22 NOTE — Therapy (Signed)
[facility] [address] [place], [place], [address] Phone: [phone]   Fax:  [phone]  Physical Therapy Treatment  Patient Details  Name: Jacqueline Nash MRN: [id number] Date of Birth: [birth year] Referring Provider: London Pepper, MD  Encounter Date: 04/22/2016      PT End of Session - 04/22/16 1525    Visit Number 15   Number of Visits 20   Date for PT Re-Evaluation 04/29/16   PT Start Time [id number]   PT Stop Time 1525   PT Time Calculation (min) 47 min   Activity Tolerance Patient tolerated treatment well   Behavior During Therapy [facility] for tasks assessed/performed      Past Medical History:  Diagnosis Date  . Cancer ([place])   . Depression   . Hyperlipidemia   . Hypothyroid   . Insomnia   . Migraine   . Osteoporosis   . Peripheral neuropathy ([place])   . Scoliosis   . Thyroid cancer ([place]) 1970  . White coat hypertension     History reviewed. No pertinent surgical history.  There were no vitals filed for this visit.      Subjective Assessment - 04/22/16 1441    Subjective My back is feeling better.     Pertinent History Osteopenia with C4 and C5 compression fractures (2003), scoliosis    Currently in Pain? No/denies   Pain Score 0-No pain                         [facility] Adult PT Treatment/Exercise - 04/22/16 0001      Lumbar Exercises: Aerobic   Stationary Bike NuStep:  Level 2 x10 minutes  , PT present to discuss progress     Lumbar Exercises: Sidelying   Clam 20 reps  red band   Other Sidelying Lumbar Exercises shouler external rotation with 1lb - 20x     Lumbar Exercises: Prone   Other Prone Lumbar Exercises serratus punches 2lb - 10 x core and scapula stability     Knee/Hip Exercises: Stretches   Active Hamstring Stretch Both;3 reps;20 seconds   Piriformis Stretch Both;2 reps;20 seconds     Knee/Hip Exercises: Machines for Strengthening   Other Machine Resisted  walking: forward, backward, sideways walking - 15# 5 x each                  PT Short Term Goals - 03/30/16 1238      PT SHORT TERM GOAL #2   Title report a 25% reduction in LBP/LE radiculopathy to allow for improved standing tolerance   Status Achieved     PT SHORT TERM GOAL #3   Title demonstrate and verbalize understanding of correct body mechanics for spinal protection   Status Achieved           PT Long Term Goals - 04/20/16 1158      PT LONG TERM GOAL #1   Title be independent in advanced HEP   Time 8   Period Weeks   Status On-going     PT LONG TERM GOAL #2   Title reduce FOTO to < or = to 49% limitation   Time 8   Period Weeks   Status On-going     PT LONG TERM GOAL #3   Title report a 40% reduction in LBP/LE radiculopathy to allow for improved standing tolerance   Status Achieved     PT LONG TERM GOAL #4   Title tolerate  standing for 10-12 minutes for housework without need to sit   Time 8   Period Weeks   Status On-going               Plan - 04/22/16 1447    Clinical Impression Statement Pt reports 90% overall reduction in pain since the start of care.  Pt modifies activity at home and takes rest breaks to reduce strain on the spine.  Pt with Lt shoulder pain that limits some activity in the clinic.  Pt requires verbal cues for core stabilization with exercises.  Pt will benefit from 2 more session to finalize HEP for core.     Rehab Potential Good   PT Frequency 2x / week   PT Duration 8 weeks   PT Treatment/Interventions ADLs/Self Care Home Management;Cryotherapy;Electrical Stimulation;Functional mobility training;Ultrasound;Moist Heat;Therapeutic activities;Therapeutic exercise;Neuromuscular re-education;Patient/family education;Passive range of motion;Dry needling;Taping   PT Next Visit Plan continue scapular stabiliazation for overall improved posture, manual as needed, glute and core strength   Consulted and Agree with Plan of Care  Patient      Patient will benefit from skilled therapeutic intervention in order to improve the following deficits and impairments:  Pain, Postural dysfunction, Decreased strength, Impaired flexibility, Improper body mechanics, Decreased activity tolerance, Increased muscle spasms, Decreased endurance, Decreased range of motion, Difficulty walking  Visit Diagnosis: Chronic bilateral low back pain with bilateral sciatica  Abnormal posture  Muscle weakness (generalized)     Problem List There are no active problems to display for this patient.    Sigurd Sos, PT 04/22/16 3:30 PM  [facility] [address], [place], [place], [address] Phone: [phone]   Fax:  [phone]  Name: Jacqueline Nash MRN: [id number] Date of Birth: [birth year]

## 2016-04-27 ENCOUNTER — Ambulatory Visit: Payer: Medicare Other

## 2016-04-27 DIAGNOSIS — R252 Cramp and spasm: Secondary | ICD-10-CM | POA: Diagnosis not present

## 2016-04-27 DIAGNOSIS — R293 Abnormal posture: Secondary | ICD-10-CM

## 2016-04-27 DIAGNOSIS — M6281 Muscle weakness (generalized): Secondary | ICD-10-CM | POA: Diagnosis not present

## 2016-04-27 DIAGNOSIS — G8929 Other chronic pain: Secondary | ICD-10-CM | POA: Diagnosis not present

## 2016-04-27 DIAGNOSIS — M5442 Lumbago with sciatica, left side: Secondary | ICD-10-CM | POA: Diagnosis not present

## 2016-04-27 DIAGNOSIS — M5441 Lumbago with sciatica, right side: Secondary | ICD-10-CM | POA: Diagnosis not present

## 2016-04-27 NOTE — Therapy (Addendum)
[facility] [address] [place], [place], [address] Phone: [phone]   Fax:  [phone]  Physical Therapy Treatment  Patient Details  Name: Jacqueline Nash MRN: [id number] Date of Birth: 07/19/1940 Referring Provider: London Pepper, MD  Encounter Date: 04/27/2016      PT End of Session - 04/27/16 1233    Visit Number 16   Number of Visits 20   Date for PT Re-Evaluation 04/29/16   PT Start Time [id number]   PT Stop Time 1231   PT Time Calculation (min) 44 min   Activity Tolerance Patient tolerated treatment well   Behavior During Therapy [facility] for tasks assessed/performed      Past Medical History:  Diagnosis Date  . Cancer ([place])   . Depression   . Hyperlipidemia   . Hypothyroid   . Insomnia   . Migraine   . Osteoporosis   . Peripheral neuropathy ([place])   . Scoliosis   . Thyroid cancer ([place]) 1970  . White coat hypertension     History reviewed. No pertinent surgical history.  There were no vitals filed for this visit.      Subjective Assessment - 04/27/16 1158    Subjective I sat through an opera this weekend that lasted 3.5 hours.     Patient Stated Goals walk, lift and carry with less pain.   Currently in Pain? Yes   Pain Score 1    Pain Location Buttocks   Pain Orientation Left   Pain Descriptors / Indicators Sore   Pain Type Chronic pain   Pain Onset More than a month ago   Pain Frequency Intermittent   Aggravating Factors  NuStep today, too much activity   Pain Relieving Factors heat, stretching, TENs            [facility] PT Assessment - 04/27/16 0001      Observation/Other Assessments   Observations 44% limitation                               PT Short Term Goals - 03/30/16 1238      PT SHORT TERM GOAL #2   Title report a 25% reduction in LBP/LE radiculopathy to allow for improved standing tolerance   Status Achieved     PT SHORT TERM GOAL #3   Title  demonstrate and verbalize understanding of correct body mechanics for spinal protection   Status Achieved           PT Long Term Goals - 04/27/16 1233      PT LONG TERM GOAL #2   Title reduce FOTO to < or = to 49% limitation   Baseline 44% limitation 04/27/16   Status Achieved               Plan - 04/27/16 1206    Clinical Impression Statement Pt reports 90% overall reduction in pain since the start of care.  Pt modifies activity at home and takes rest breaks to reduce strain on the spine.  Pt requires verbal cues for core stabilization with exercise.  Pt will discharge after next session to HEP.     Rehab Potential Good   PT Frequency 2x / week   PT Duration 8 weeks   PT Treatment/Interventions ADLs/Self Care Home Management;Cryotherapy;Electrical Stimulation;Functional mobility training;Ultrasound;Moist Heat;Therapeutic activities;Therapeutic exercise;Neuromuscular re-education;Patient/family education;Passive range of motion;Dry needling;Taping   PT Next Visit Plan D/C PT next session.  G-codes.  Consulted and Agree with Plan of Care Patient      Patient will benefit from skilled therapeutic intervention in order to improve the following deficits and impairments:  Pain, Postural dysfunction, Decreased strength, Impaired flexibility, Improper body mechanics, Decreased activity tolerance, Increased muscle spasms, Decreased endurance, Decreased range of motion, Difficulty walking  Visit Diagnosis: Chronic bilateral low back pain with bilateral sciatica  Abnormal posture  Muscle weakness (generalized)     Problem List There are no active problems to display for this patient.    Sigurd Sos, PT 04/27/16 12:35 PM PHYSICAL THERAPY DISCHARGE SUMMARY G-codes: mobility category Goal Status: CK D/C status: CK Visits from Start of Care: 16  Current functional level related to goals / functional outcomes: See above for current status.     Remaining  deficits: See above   Education / Equipment: HEP, posture/body mechanics Plan: Patient agrees to discharge.  Patient goals were met. Patient is being discharged due to meeting the stated rehab goals.  ?????        Sigurd Sos, PT 05/04/16 8:31 AM   [facility] [address] [place], [place], [address] Phone: [phone]   Fax:  [phone]  Name: Charlotta Lapaglia MRN: [id number] Date of Birth: [birth year]

## 2016-04-29 ENCOUNTER — Ambulatory Visit: Payer: Self-pay

## 2016-05-06 ENCOUNTER — Other Ambulatory Visit: Payer: Self-pay | Admitting: Family Medicine

## 2016-05-06 DIAGNOSIS — Z1231 Encounter for screening mammogram for malignant neoplasm of breast: Secondary | ICD-10-CM

## 2016-05-14 DIAGNOSIS — M25511 Pain in right shoulder: Secondary | ICD-10-CM | POA: Diagnosis not present

## 2016-05-14 DIAGNOSIS — M25512 Pain in left shoulder: Secondary | ICD-10-CM | POA: Diagnosis not present

## 2016-05-14 DIAGNOSIS — G8929 Other chronic pain: Secondary | ICD-10-CM | POA: Diagnosis not present

## 2016-05-29 ENCOUNTER — Ambulatory Visit
Admission: RE | Admit: 2016-05-29 | Discharge: 2016-05-29 | Disposition: A | Discharge status: Home / Self Care | Payer: Medicare Other | Source: Ambulatory Visit | Attending: Family Medicine | Admitting: Family Medicine

## 2016-05-29 DIAGNOSIS — Z1231 Encounter for screening mammogram for malignant neoplasm of breast: Secondary | ICD-10-CM

## 2016-08-05 DIAGNOSIS — E079 Disorder of thyroid, unspecified: Secondary | ICD-10-CM | POA: Diagnosis not present

## 2016-08-05 DIAGNOSIS — R112 Nausea with vomiting, unspecified: Secondary | ICD-10-CM | POA: Diagnosis not present

## 2016-08-05 DIAGNOSIS — K862 Cyst of pancreas: Secondary | ICD-10-CM | POA: Diagnosis not present

## 2016-08-05 DIAGNOSIS — R1032 Left lower quadrant pain: Secondary | ICD-10-CM | POA: Diagnosis not present

## 2016-09-29 DIAGNOSIS — E039 Hypothyroidism, unspecified: Secondary | ICD-10-CM | POA: Diagnosis not present

## 2016-09-29 DIAGNOSIS — K2930 Chronic superficial gastritis without bleeding: Secondary | ICD-10-CM | POA: Diagnosis not present

## 2016-09-29 DIAGNOSIS — K2970 Gastritis, unspecified, without bleeding: Secondary | ICD-10-CM | POA: Diagnosis not present

## 2016-09-29 DIAGNOSIS — K2950 Unspecified chronic gastritis without bleeding: Secondary | ICD-10-CM | POA: Diagnosis not present

## 2016-09-29 DIAGNOSIS — R112 Nausea with vomiting, unspecified: Secondary | ICD-10-CM | POA: Diagnosis not present

## 2016-10-01 DIAGNOSIS — M549 Dorsalgia, unspecified: Secondary | ICD-10-CM | POA: Diagnosis not present

## 2016-10-01 DIAGNOSIS — K589 Irritable bowel syndrome without diarrhea: Secondary | ICD-10-CM | POA: Diagnosis not present

## 2016-10-01 DIAGNOSIS — Z87891 Personal history of nicotine dependence: Secondary | ICD-10-CM | POA: Diagnosis not present

## 2016-10-01 DIAGNOSIS — R110 Nausea: Secondary | ICD-10-CM | POA: Diagnosis not present

## 2016-10-01 DIAGNOSIS — E785 Hyperlipidemia, unspecified: Secondary | ICD-10-CM | POA: Diagnosis not present

## 2016-10-01 DIAGNOSIS — R112 Nausea with vomiting, unspecified: Secondary | ICD-10-CM | POA: Diagnosis not present

## 2016-10-01 DIAGNOSIS — C73 Malignant neoplasm of thyroid gland: Secondary | ICD-10-CM | POA: Diagnosis not present

## 2016-10-01 DIAGNOSIS — R1032 Left lower quadrant pain: Secondary | ICD-10-CM | POA: Diagnosis not present

## 2016-10-01 DIAGNOSIS — Z79899 Other long term (current) drug therapy: Secondary | ICD-10-CM | POA: Diagnosis not present

## 2016-10-01 DIAGNOSIS — Z6824 Body mass index (BMI) 24.0-24.9, adult: Secondary | ICD-10-CM | POA: Diagnosis not present

## 2016-10-01 DIAGNOSIS — K862 Cyst of pancreas: Secondary | ICD-10-CM | POA: Diagnosis not present

## 2016-10-01 DIAGNOSIS — R918 Other nonspecific abnormal finding of lung field: Secondary | ICD-10-CM | POA: Diagnosis not present

## 2016-10-01 DIAGNOSIS — M419 Scoliosis, unspecified: Secondary | ICD-10-CM | POA: Diagnosis not present

## 2016-10-01 DIAGNOSIS — G629 Polyneuropathy, unspecified: Secondary | ICD-10-CM | POA: Diagnosis not present

## 2016-10-01 DIAGNOSIS — K7689 Other specified diseases of liver: Secondary | ICD-10-CM | POA: Diagnosis not present

## 2016-10-01 DIAGNOSIS — E038 Other specified hypothyroidism: Secondary | ICD-10-CM | POA: Diagnosis not present

## 2016-10-01 DIAGNOSIS — N281 Cyst of kidney, acquired: Secondary | ICD-10-CM | POA: Diagnosis not present

## 2016-10-01 DIAGNOSIS — M545 Low back pain: Secondary | ICD-10-CM | POA: Diagnosis not present

## 2016-10-01 DIAGNOSIS — R634 Abnormal weight loss: Secondary | ICD-10-CM | POA: Diagnosis not present

## 2016-10-01 DIAGNOSIS — E039 Hypothyroidism, unspecified: Secondary | ICD-10-CM | POA: Diagnosis not present

## 2016-10-01 DIAGNOSIS — M21372 Foot drop, left foot: Secondary | ICD-10-CM | POA: Diagnosis not present

## 2016-10-01 DIAGNOSIS — G8929 Other chronic pain: Secondary | ICD-10-CM | POA: Diagnosis not present

## 2016-10-01 DIAGNOSIS — R1900 Intra-abdominal and pelvic swelling, mass and lump, unspecified site: Secondary | ICD-10-CM | POA: Diagnosis not present

## 2016-10-01 DIAGNOSIS — Z800 Family history of malignant neoplasm of digestive organs: Secondary | ICD-10-CM | POA: Diagnosis not present

## 2016-10-26 DIAGNOSIS — R1900 Intra-abdominal and pelvic swelling, mass and lump, unspecified site: Secondary | ICD-10-CM | POA: Diagnosis not present

## 2016-10-26 DIAGNOSIS — K862 Cyst of pancreas: Secondary | ICD-10-CM | POA: Diagnosis not present

## 2016-10-26 DIAGNOSIS — R110 Nausea: Secondary | ICD-10-CM | POA: Diagnosis not present

## 2016-10-26 DIAGNOSIS — R1032 Left lower quadrant pain: Secondary | ICD-10-CM | POA: Diagnosis not present

## 2016-10-26 DIAGNOSIS — G629 Polyneuropathy, unspecified: Secondary | ICD-10-CM | POA: Diagnosis not present

## 2016-10-29 DIAGNOSIS — H1013 Acute atopic conjunctivitis, bilateral: Secondary | ICD-10-CM | POA: Diagnosis not present

## 2016-10-29 DIAGNOSIS — H35371 Puckering of macula, right eye: Secondary | ICD-10-CM | POA: Diagnosis not present

## 2016-10-29 DIAGNOSIS — H353131 Nonexudative age-related macular degeneration, bilateral, early dry stage: Secondary | ICD-10-CM | POA: Diagnosis not present

## 2016-10-29 DIAGNOSIS — H26492 Other secondary cataract, left eye: Secondary | ICD-10-CM | POA: Diagnosis not present

## 2016-10-29 DIAGNOSIS — H35033 Hypertensive retinopathy, bilateral: Secondary | ICD-10-CM | POA: Diagnosis not present

## 2016-10-29 DIAGNOSIS — H26491 Other secondary cataract, right eye: Secondary | ICD-10-CM | POA: Diagnosis not present

## 2016-11-05 DIAGNOSIS — M549 Dorsalgia, unspecified: Secondary | ICD-10-CM | POA: Diagnosis not present

## 2016-11-05 DIAGNOSIS — G4700 Insomnia, unspecified: Secondary | ICD-10-CM | POA: Diagnosis not present

## 2016-11-05 DIAGNOSIS — K589 Irritable bowel syndrome without diarrhea: Secondary | ICD-10-CM | POA: Diagnosis not present

## 2016-11-05 DIAGNOSIS — N281 Cyst of kidney, acquired: Secondary | ICD-10-CM | POA: Diagnosis not present

## 2016-11-05 DIAGNOSIS — E782 Mixed hyperlipidemia: Secondary | ICD-10-CM | POA: Diagnosis not present

## 2016-11-05 DIAGNOSIS — G8929 Other chronic pain: Secondary | ICD-10-CM | POA: Diagnosis not present

## 2016-11-05 DIAGNOSIS — E890 Postprocedural hypothyroidism: Secondary | ICD-10-CM | POA: Diagnosis not present

## 2016-11-05 DIAGNOSIS — R1032 Left lower quadrant pain: Secondary | ICD-10-CM | POA: Diagnosis not present

## 2016-11-05 DIAGNOSIS — R110 Nausea: Secondary | ICD-10-CM | POA: Diagnosis not present

## 2016-11-05 DIAGNOSIS — M21372 Foot drop, left foot: Secondary | ICD-10-CM | POA: Diagnosis not present

## 2016-11-05 DIAGNOSIS — K7689 Other specified diseases of liver: Secondary | ICD-10-CM | POA: Diagnosis not present

## 2016-11-05 DIAGNOSIS — Z87891 Personal history of nicotine dependence: Secondary | ICD-10-CM | POA: Diagnosis not present

## 2016-11-05 DIAGNOSIS — G629 Polyneuropathy, unspecified: Secondary | ICD-10-CM | POA: Diagnosis not present

## 2016-11-05 DIAGNOSIS — R918 Other nonspecific abnormal finding of lung field: Secondary | ICD-10-CM | POA: Diagnosis not present

## 2016-11-05 DIAGNOSIS — M62549 Muscle wasting and atrophy, not elsewhere classified, unspecified hand: Secondary | ICD-10-CM | POA: Diagnosis not present

## 2016-11-05 DIAGNOSIS — K862 Cyst of pancreas: Secondary | ICD-10-CM | POA: Diagnosis not present

## 2016-11-12 DIAGNOSIS — H26492 Other secondary cataract, left eye: Secondary | ICD-10-CM | POA: Diagnosis not present

## 2016-12-21 DIAGNOSIS — M545 Low back pain: Secondary | ICD-10-CM | POA: Diagnosis not present

## 2016-12-21 DIAGNOSIS — E782 Mixed hyperlipidemia: Secondary | ICD-10-CM | POA: Diagnosis not present

## 2016-12-21 DIAGNOSIS — G8929 Other chronic pain: Secondary | ICD-10-CM | POA: Diagnosis not present

## 2016-12-21 DIAGNOSIS — C73 Malignant neoplasm of thyroid gland: Secondary | ICD-10-CM | POA: Diagnosis not present

## 2016-12-21 DIAGNOSIS — E038 Other specified hypothyroidism: Secondary | ICD-10-CM | POA: Diagnosis not present

## 2016-12-21 DIAGNOSIS — R1032 Left lower quadrant pain: Secondary | ICD-10-CM | POA: Diagnosis not present

## 2016-12-21 DIAGNOSIS — K7689 Other specified diseases of liver: Secondary | ICD-10-CM | POA: Diagnosis not present

## 2016-12-21 DIAGNOSIS — K588 Other irritable bowel syndrome: Secondary | ICD-10-CM | POA: Diagnosis not present

## 2016-12-21 DIAGNOSIS — N281 Cyst of kidney, acquired: Secondary | ICD-10-CM | POA: Diagnosis not present

## 2016-12-21 DIAGNOSIS — K862 Cyst of pancreas: Secondary | ICD-10-CM | POA: Diagnosis not present

## 2016-12-21 DIAGNOSIS — G629 Polyneuropathy, unspecified: Secondary | ICD-10-CM | POA: Diagnosis not present

## 2017-01-04 DIAGNOSIS — Z23 Encounter for immunization: Secondary | ICD-10-CM | POA: Diagnosis not present

## 2017-01-08 DIAGNOSIS — R112 Nausea with vomiting, unspecified: Secondary | ICD-10-CM | POA: Diagnosis not present

## 2017-01-11 DIAGNOSIS — K862 Cyst of pancreas: Secondary | ICD-10-CM | POA: Diagnosis not present

## 2017-01-11 DIAGNOSIS — R1032 Left lower quadrant pain: Secondary | ICD-10-CM | POA: Diagnosis not present

## 2017-01-11 DIAGNOSIS — Z79899 Other long term (current) drug therapy: Secondary | ICD-10-CM | POA: Diagnosis not present

## 2017-01-11 DIAGNOSIS — Z800 Family history of malignant neoplasm of digestive organs: Secondary | ICD-10-CM | POA: Diagnosis not present

## 2017-01-11 DIAGNOSIS — R112 Nausea with vomiting, unspecified: Secondary | ICD-10-CM | POA: Diagnosis not present

## 2017-03-01 DIAGNOSIS — L814 Other melanin hyperpigmentation: Secondary | ICD-10-CM | POA: Diagnosis not present

## 2017-03-01 DIAGNOSIS — D2261 Melanocytic nevi of right upper limb, including shoulder: Secondary | ICD-10-CM | POA: Diagnosis not present

## 2017-03-01 DIAGNOSIS — D2262 Melanocytic nevi of left upper limb, including shoulder: Secondary | ICD-10-CM | POA: Diagnosis not present

## 2017-03-01 DIAGNOSIS — L821 Other seborrheic keratosis: Secondary | ICD-10-CM | POA: Diagnosis not present

## 2017-03-01 DIAGNOSIS — L853 Xerosis cutis: Secondary | ICD-10-CM | POA: Diagnosis not present

## 2017-03-01 DIAGNOSIS — D692 Other nonthrombocytopenic purpura: Secondary | ICD-10-CM | POA: Diagnosis not present

## 2017-03-01 DIAGNOSIS — D225 Melanocytic nevi of trunk: Secondary | ICD-10-CM | POA: Diagnosis not present

## 2017-03-25 DIAGNOSIS — E038 Other specified hypothyroidism: Secondary | ICD-10-CM | POA: Diagnosis not present

## 2017-03-25 DIAGNOSIS — E782 Mixed hyperlipidemia: Secondary | ICD-10-CM | POA: Diagnosis not present

## 2017-03-25 DIAGNOSIS — R110 Nausea: Secondary | ICD-10-CM | POA: Diagnosis not present

## 2017-04-26 ENCOUNTER — Other Ambulatory Visit: Payer: Self-pay | Admitting: Family Medicine

## 2017-04-26 DIAGNOSIS — Z1231 Encounter for screening mammogram for malignant neoplasm of breast: Secondary | ICD-10-CM

## 2017-05-26 DIAGNOSIS — H35033 Hypertensive retinopathy, bilateral: Secondary | ICD-10-CM | POA: Diagnosis not present

## 2017-05-26 DIAGNOSIS — H35371 Puckering of macula, right eye: Secondary | ICD-10-CM | POA: Diagnosis not present

## 2017-05-26 DIAGNOSIS — H04123 Dry eye syndrome of bilateral lacrimal glands: Secondary | ICD-10-CM | POA: Diagnosis not present

## 2017-05-26 DIAGNOSIS — H353131 Nonexudative age-related macular degeneration, bilateral, early dry stage: Secondary | ICD-10-CM | POA: Diagnosis not present

## 2017-05-26 DIAGNOSIS — Z961 Presence of intraocular lens: Secondary | ICD-10-CM | POA: Diagnosis not present

## 2017-05-31 ENCOUNTER — Ambulatory Visit
Admission: RE | Admit: 2017-05-31 | Discharge: 2017-05-31 | Disposition: A | Discharge status: Home / Self Care | Payer: Medicare Other | Source: Ambulatory Visit | Attending: Family Medicine | Admitting: Family Medicine

## 2017-05-31 DIAGNOSIS — Z1231 Encounter for screening mammogram for malignant neoplasm of breast: Secondary | ICD-10-CM

## 2017-06-24 DIAGNOSIS — K588 Other irritable bowel syndrome: Secondary | ICD-10-CM | POA: Diagnosis not present

## 2017-06-24 DIAGNOSIS — R110 Nausea: Secondary | ICD-10-CM | POA: Diagnosis not present

## 2017-06-24 DIAGNOSIS — E038 Other specified hypothyroidism: Secondary | ICD-10-CM | POA: Diagnosis not present

## 2017-06-24 DIAGNOSIS — C73 Malignant neoplasm of thyroid gland: Secondary | ICD-10-CM | POA: Diagnosis not present

## 2017-06-24 DIAGNOSIS — E782 Mixed hyperlipidemia: Secondary | ICD-10-CM | POA: Diagnosis not present

## 2017-07-02 DIAGNOSIS — R17 Unspecified jaundice: Secondary | ICD-10-CM | POA: Diagnosis not present

## 2017-07-02 DIAGNOSIS — E119 Type 2 diabetes mellitus without complications: Secondary | ICD-10-CM | POA: Diagnosis not present

## 2017-07-02 DIAGNOSIS — K862 Cyst of pancreas: Secondary | ICD-10-CM | POA: Diagnosis not present

## 2017-07-02 DIAGNOSIS — R110 Nausea: Secondary | ICD-10-CM | POA: Diagnosis not present

## 2017-07-02 DIAGNOSIS — Z800 Family history of malignant neoplasm of digestive organs: Secondary | ICD-10-CM | POA: Diagnosis not present

## 2017-07-02 DIAGNOSIS — R1032 Left lower quadrant pain: Secondary | ICD-10-CM | POA: Diagnosis not present

## 2017-10-11 DIAGNOSIS — R634 Abnormal weight loss: Secondary | ICD-10-CM | POA: Diagnosis not present

## 2017-10-11 DIAGNOSIS — R1032 Left lower quadrant pain: Secondary | ICD-10-CM | POA: Diagnosis not present

## 2017-10-11 DIAGNOSIS — Z79899 Other long term (current) drug therapy: Secondary | ICD-10-CM | POA: Diagnosis not present

## 2017-10-11 DIAGNOSIS — Z800 Family history of malignant neoplasm of digestive organs: Secondary | ICD-10-CM | POA: Diagnosis not present

## 2017-10-11 DIAGNOSIS — R110 Nausea: Secondary | ICD-10-CM | POA: Diagnosis not present

## 2017-10-11 DIAGNOSIS — R17 Unspecified jaundice: Secondary | ICD-10-CM | POA: Diagnosis not present

## 2017-10-11 DIAGNOSIS — E119 Type 2 diabetes mellitus without complications: Secondary | ICD-10-CM | POA: Diagnosis not present

## 2017-10-11 DIAGNOSIS — K862 Cyst of pancreas: Secondary | ICD-10-CM | POA: Diagnosis not present

## 2017-11-29 DIAGNOSIS — M62549 Muscle wasting and atrophy, not elsewhere classified, unspecified hand: Secondary | ICD-10-CM | POA: Diagnosis not present

## 2017-11-29 DIAGNOSIS — K588 Other irritable bowel syndrome: Secondary | ICD-10-CM | POA: Diagnosis not present

## 2017-11-29 DIAGNOSIS — M5412 Radiculopathy, cervical region: Secondary | ICD-10-CM | POA: Diagnosis not present

## 2017-11-29 DIAGNOSIS — E782 Mixed hyperlipidemia: Secondary | ICD-10-CM | POA: Diagnosis not present

## 2017-11-29 DIAGNOSIS — Z13820 Encounter for screening for osteoporosis: Secondary | ICD-10-CM | POA: Diagnosis not present

## 2017-11-29 DIAGNOSIS — G629 Polyneuropathy, unspecified: Secondary | ICD-10-CM | POA: Diagnosis not present

## 2017-12-01 ENCOUNTER — Other Ambulatory Visit: Payer: Self-pay | Admitting: Family Medicine

## 2017-12-01 DIAGNOSIS — R5381 Other malaise: Secondary | ICD-10-CM

## 2017-12-02 DIAGNOSIS — E782 Mixed hyperlipidemia: Secondary | ICD-10-CM | POA: Diagnosis not present

## 2017-12-02 DIAGNOSIS — K909 Intestinal malabsorption, unspecified: Secondary | ICD-10-CM | POA: Diagnosis not present

## 2017-12-02 DIAGNOSIS — G2581 Restless legs syndrome: Secondary | ICD-10-CM | POA: Diagnosis not present

## 2017-12-03 ENCOUNTER — Other Ambulatory Visit: Payer: Self-pay | Admitting: Family Medicine

## 2017-12-03 DIAGNOSIS — E2839 Other primary ovarian failure: Secondary | ICD-10-CM

## 2018-01-03 DIAGNOSIS — Z23 Encounter for immunization: Secondary | ICD-10-CM | POA: Diagnosis not present

## 2018-01-26 ENCOUNTER — Ambulatory Visit
Admission: RE | Admit: 2018-01-26 | Discharge: 2018-01-26 | Disposition: A | Discharge status: Home / Self Care | Payer: Medicare Other | Source: Ambulatory Visit | Attending: Family Medicine | Admitting: Family Medicine

## 2018-01-26 DIAGNOSIS — M810 Age-related osteoporosis without current pathological fracture: Secondary | ICD-10-CM | POA: Diagnosis not present

## 2018-01-26 DIAGNOSIS — E2839 Other primary ovarian failure: Secondary | ICD-10-CM

## 2018-01-26 DIAGNOSIS — M85832 Other specified disorders of bone density and structure, left forearm: Secondary | ICD-10-CM | POA: Diagnosis not present

## 2018-02-24 DIAGNOSIS — R2689 Other abnormalities of gait and mobility: Secondary | ICD-10-CM | POA: Diagnosis not present

## 2018-02-24 DIAGNOSIS — M419 Scoliosis, unspecified: Secondary | ICD-10-CM | POA: Diagnosis not present

## 2018-02-24 DIAGNOSIS — E1142 Type 2 diabetes mellitus with diabetic polyneuropathy: Secondary | ICD-10-CM | POA: Diagnosis not present

## 2018-02-24 DIAGNOSIS — R292 Abnormal reflex: Secondary | ICD-10-CM | POA: Diagnosis not present

## 2018-02-24 DIAGNOSIS — G629 Polyneuropathy, unspecified: Secondary | ICD-10-CM | POA: Diagnosis not present

## 2018-03-03 DIAGNOSIS — L821 Other seborrheic keratosis: Secondary | ICD-10-CM | POA: Diagnosis not present

## 2018-03-03 DIAGNOSIS — D225 Melanocytic nevi of trunk: Secondary | ICD-10-CM | POA: Diagnosis not present

## 2018-03-03 DIAGNOSIS — L298 Other pruritus: Secondary | ICD-10-CM | POA: Diagnosis not present

## 2018-03-03 DIAGNOSIS — L300 Nummular dermatitis: Secondary | ICD-10-CM | POA: Diagnosis not present

## 2018-03-03 DIAGNOSIS — L570 Actinic keratosis: Secondary | ICD-10-CM | POA: Diagnosis not present

## 2018-03-14 DIAGNOSIS — R112 Nausea with vomiting, unspecified: Secondary | ICD-10-CM | POA: Diagnosis not present

## 2018-03-14 DIAGNOSIS — R110 Nausea: Secondary | ICD-10-CM | POA: Diagnosis not present

## 2018-03-14 DIAGNOSIS — Z79899 Other long term (current) drug therapy: Secondary | ICD-10-CM | POA: Diagnosis not present

## 2018-03-14 DIAGNOSIS — R1032 Left lower quadrant pain: Secondary | ICD-10-CM | POA: Diagnosis not present

## 2018-03-14 DIAGNOSIS — K862 Cyst of pancreas: Secondary | ICD-10-CM | POA: Diagnosis not present

## 2018-03-14 DIAGNOSIS — M50323 Other cervical disc degeneration at C6-C7 level: Secondary | ICD-10-CM | POA: Diagnosis not present

## 2018-03-14 DIAGNOSIS — M5124 Other intervertebral disc displacement, thoracic region: Secondary | ICD-10-CM | POA: Diagnosis not present

## 2018-03-14 DIAGNOSIS — K2970 Gastritis, unspecified, without bleeding: Secondary | ICD-10-CM | POA: Diagnosis not present

## 2018-03-14 DIAGNOSIS — G959 Disease of spinal cord, unspecified: Secondary | ICD-10-CM | POA: Diagnosis not present

## 2018-03-14 DIAGNOSIS — Z800 Family history of malignant neoplasm of digestive organs: Secondary | ICD-10-CM | POA: Diagnosis not present

## 2018-03-14 DIAGNOSIS — M4182 Other forms of scoliosis, cervical region: Secondary | ICD-10-CM | POA: Diagnosis not present

## 2018-03-21 DIAGNOSIS — E041 Nontoxic single thyroid nodule: Secondary | ICD-10-CM | POA: Diagnosis not present

## 2018-03-21 DIAGNOSIS — E782 Mixed hyperlipidemia: Secondary | ICD-10-CM | POA: Diagnosis not present

## 2018-03-21 DIAGNOSIS — G5621 Lesion of ulnar nerve, right upper limb: Secondary | ICD-10-CM | POA: Diagnosis not present

## 2018-03-21 DIAGNOSIS — M810 Age-related osteoporosis without current pathological fracture: Secondary | ICD-10-CM | POA: Diagnosis not present

## 2018-04-11 DIAGNOSIS — G629 Polyneuropathy, unspecified: Secondary | ICD-10-CM | POA: Diagnosis not present

## 2018-04-11 DIAGNOSIS — G6289 Other specified polyneuropathies: Secondary | ICD-10-CM | POA: Diagnosis not present

## 2018-04-11 DIAGNOSIS — G5621 Lesion of ulnar nerve, right upper limb: Secondary | ICD-10-CM | POA: Diagnosis not present

## 2018-05-06 ENCOUNTER — Other Ambulatory Visit: Payer: Self-pay | Admitting: Family Medicine

## 2018-05-06 DIAGNOSIS — Z1231 Encounter for screening mammogram for malignant neoplasm of breast: Secondary | ICD-10-CM

## 2018-05-09 ENCOUNTER — Encounter: Payer: Self-pay | Admitting: Occupational Therapy

## 2018-05-09 ENCOUNTER — Other Ambulatory Visit: Payer: Self-pay

## 2018-05-09 ENCOUNTER — Ambulatory Visit: Payer: Medicare Other | Attending: Family Medicine | Admitting: Occupational Therapy

## 2018-05-09 DIAGNOSIS — M6281 Muscle weakness (generalized): Secondary | ICD-10-CM

## 2018-05-09 DIAGNOSIS — R278 Other lack of coordination: Secondary | ICD-10-CM | POA: Diagnosis present

## 2018-05-09 DIAGNOSIS — R208 Other disturbances of skin sensation: Secondary | ICD-10-CM | POA: Diagnosis present

## 2018-05-09 NOTE — Therapy (Signed)
[facility] [address] [place], [place], [address] Phone: [phone]   Fax:  [phone]  Occupational Therapy Evaluation  Patient Details  Name: Jacqueline Nash MRN: [id number] Date of Birth: 24-Nov-1940 Referring Provider (OT): Dr. Jenny Reichmann Given Spangler   Encounter Date: 05/09/2018  OT End of Session - 05/09/18 1605    Visit Number  1    Number of Visits  1    Date for OT Re-Evaluation  --   eval only   Authorization Type  Medicare     OT Start Time  1322    OT Stop Time  1403    OT Time Calculation (min)  41 min    Activity Tolerance  Patient tolerated treatment well    Behavior During Therapy  [facility] for tasks assessed/performed       Past Medical History:  Diagnosis Date  . Cancer ([place])   . Depression   . Hyperlipidemia   . Hypothyroid   . Insomnia   . Migraine   . Osteoporosis   . Peripheral neuropathy   . Scoliosis   . Thyroid cancer ([place]) 1970  . White coat hypertension     History reviewed. No pertinent surgical history.  There were no vitals filed for this visit.  Subjective Assessment - 05/09/18 1328    Subjective   Pt reports that she does not feel that her hands are limiting function and would like to have physical therapy address gait/balance instead of OT addressing hands  (pt to discuss with MD).  Pt feels limitations in ADLs/IADLs are due to back pain.    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     PMH:  hx of thyroid CA, scoliosis, peripheral neuropathy, osteoporosis, migraine, hypothyroid, hyperlipidemia, depression, hx of R CTR (30 yrs ago)    Limitations  fall risk    Patient Stated Goals  improve balance/gait    Currently in Pain?  Yes    Pain Score  5     Pain Location  Back    Pain Orientation  Right    Pain Descriptors / Indicators  Aching;Dull    Pain Type  Chronic pain    Pain Onset  More than a month ago    Pain  Frequency  Constant    Aggravating Factors   walking distance, working in garden    Pain Relieving Factors  rest    Effect of Pain on Daily Activities  OT will not address due to location and chronic nature.        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 Jenny Reichmann Given Spangler    Onset Date/Surgical Date  --   after imaging 03/2018, referral 04/26/18   Hand Dominance  Right    Prior Therapy  PT for back pain approx 2 yrs ago      Precautions   Precautions  Fall      Restrictions   Weight Bearing Restrictions  No      Balance Screen   Has the patient fallen in the past 6 months  Yes    How many times?  1   early January 2020, no injury, ?tripped     Lares With  Alone      Prior Function   Level of  Independence  Independent with basic ADLs    Leisure  likes to cook, likes to garden      ADL   ADL comments  Pt is performing BADLs mod I.  Pt reports difficulty holding hair dryer in LUE overhead due to long-standing L shoulder "problems", difficulty putting on jewerly, uses jar opener, uses scissors to open packages.        IADL   Prior Level of Function Shopping  has assist for putting groceries in car, lightens bags to carry in house due to back pain    Prior Level of Function Light Housekeeping  Pt has someone help for heavier tasks due to back pain    Prior Level of Function Meal Prep  loves to cook, has made modifications (due to difficulty carrying pots/pans)      Mobility   Mobility Status  History of falls    Mobility Status Comments  Using single point cane with quad rubber tip (usually in R hand)      Written Expression   Dominant Hand  Right      Sensation   Additional Comments  Pt reports numbness/tingling occasionally 5th digits bilaterally at night only      Coordination   9 Hole Peg Test  Right;Left     Right 9 Hole Peg Test  28.68    Left 9 Hole Peg Test  24      ROM / Strength   AROM / PROM / Strength  AROM      AROM   Overall AROM   Deficits    Overall AROM Comments  decr L shoulder flex/abduction (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  Pt does not feel that she needs OT at this time (she is active, exercising hands, and has made adaptions for ADLs prn).    Person(s) Educated  Patient    Methods  Explanation    Comprehension  Verbalized understanding                 Plan - 05/09/18 1606    Clinical Impression Statement  Pt is a 78 y.o. female referred to occupational therapy for bilateral hand weakness (cervical radiculopathy, idopathic neuropathy).  Pt with PMH that includes:  hx of thyroid CA, scoliosis, peripheral neuropathy, osteoporosis, migraine, hypothyroid, hyperlipidemia, depression, hx of R CTR (30 yrs ago).  Pt presents today with decr strength, decr coordination, and parasthesias.  However, pt denies functional difficulties with hands.  Pt does not feel that she needs occupational therapy at this time and plans to speak to physican about referral for physical therapy to address more recent gait/balance changes with 1 recent fall.      Occupational Profile and client history currently impacting functional performance  Pt is performing BADLs and IADLs mod I.      OT Frequency  --   eval only  OT Treatment/Interventions  Self-care/ADL training;Patient/family education;Splinting;Therapeutic exercise;Therapeutic activities    Plan  Pt reports that hand weakness is not impacting  ADLs/IADLs or functional use of hands.  Pt does not feel that she needs occupational therapy, but plans to discuss physical therapy referral for gait/balance to address fall risk/recent fall.    Clinical Decision Making  Limited treatment options, no task modification necessary    Recommended Other Services  physical therapy for gait/balance--pt with recent fall    Consulted and Agree with Plan of Care  Patient       Patient will benefit from skilled therapeutic intervention in order to improve the following deficits and impairments:     Visit Diagnosis: Muscle weakness (generalized)  Other disturbances of skin sensation  Other lack of coordination    Problem List There are no active problems to display for this patient.   [facility] 05/09/2018, 4:23 PM  [facility] [address] [place], [place], [address] Phone: [phone]   Fax:  [phone]  Name: Beverley Allender MRN: [id number] Date of Birth: 05-Jul-1940   Vianne Bulls, OTR/L [facility] [address] [place],   [address] [phone] phone [phone] 05/09/18 4:24 PM

## 2018-06-03 ENCOUNTER — Ambulatory Visit: Payer: Medicare Other

## 2018-06-10 ENCOUNTER — Ambulatory Visit: Payer: Medicare Other | Attending: Family Medicine | Admitting: Physical Therapy

## 2018-06-10 ENCOUNTER — Other Ambulatory Visit: Payer: Self-pay

## 2018-06-10 ENCOUNTER — Encounter: Payer: Self-pay | Admitting: Physical Therapy

## 2018-06-10 DIAGNOSIS — M6281 Muscle weakness (generalized): Secondary | ICD-10-CM | POA: Diagnosis not present

## 2018-06-10 DIAGNOSIS — R2681 Unsteadiness on feet: Secondary | ICD-10-CM

## 2018-06-10 DIAGNOSIS — Z9181 History of falling: Secondary | ICD-10-CM | POA: Insufficient documentation

## 2018-06-10 NOTE — Therapy (Signed)
[facility] [address], [place], [place], [address] Phone: [phone]   Fax:  [phone]  Physical Therapy Evaluation  Patient Details  Name: Jacqueline Nash MRN: [id number] Date of Birth: 05-11-1940 Referring Provider (PT): Marlou Porch, MD    Encounter Date: 06/10/2018  PT End of Session - 06/10/18 1016    Visit Number  1    Date for PT Re-Evaluation  08/12/18    Authorization Type  Medicare A and B     Authorization Time Period  06/10/18 to 08/12/18    PT Start Time  0930    PT Stop Time  1014    PT Time Calculation (min)  44 min    Activity Tolerance  No increased pain;Patient tolerated treatment well    Behavior During Therapy  [facility] for tasks assessed/performed       Past Medical History:  Diagnosis Date  . Cancer ([place])   . Depression   . Hyperlipidemia   . Hypothyroid   . Insomnia   . Migraine   . Osteoporosis   . Peripheral neuropathy   . Scoliosis   . Thyroid cancer ([place]) 1970  . White coat hypertension     History reviewed. No pertinent surgical history.  There were no vitals filed for this visit.   Subjective Assessment - 06/10/18 0937    Subjective  Pt reports that she has been having increasing issues with her gait and balance over the past 6 months. She has good and bad days, but on a bad day she feels that her balance is worse by the end of the day. She has had multiple falls over the past 6 months.     Pertinent History  osteoporosis; scoliosis    Limitations  Standing;Walking    Patient Stated Goals  improve balance and stamina    Currently in Pain?  No/denies         [facility] PT Assessment - 06/10/18 0001      Assessment   Medical Diagnosis  Gait/balance disorder    Referring Provider (PT)  Marlou Porch, MD     Onset Date/Surgical Date  --   6 months ago   Next MD Visit  not sure     Prior Therapy  PT for back pain 2 years ago       Precautions   Precautions  Fall      Restrictions   Weight Bearing Restrictions  No      Balance Screen   Has the patient fallen in the past 6 months  Yes    How many times?  several times    Has the patient had a decrease in activity level because of a fear of falling?   No    Is the patient reluctant to leave their home because of a fear of falling?   No      Home Film/video editor residence    Additional Comments  single story home       Prior Function   Level of [place] with basic ADLs    Leisure  shopping, likes to garden       Cognition   Overall Cognitive Status  Within Functional Limits for tasks assessed      Sensation   Additional Comments  burning sensation bottom of Lt foot       ROM / Strength   AROM / PROM / Strength  Strength  Strength   Strength Assessment Site  Hip;Knee;Ankle    Right/Left Hip  Right;Left    Right Hip Flexion  3+/5    Right Hip Extension  2+/5    Right Hip ABduction  3/5    Left Hip Flexion  3+/5    Left Hip Extension  2+/5    Left Hip ABduction  3/5    Right/Left Knee  Right;Left    Right Knee Extension  5/5    Left Knee Extension  5/5    Right/Left Ankle  Right;Left    Right Ankle Dorsiflexion  5/5    Right Ankle Plantar Flexion  --   single leg heel raises less than 5    Left Ankle Dorsiflexion  5/5    Left Ankle Plantar Flexion  --   single leg heel raises less than 5     Ambulation/Gait   Gait Comments  decreased step length, flexed posture, using SPC      6 minute walk test results    Endurance additional comments  6 MWT: SPC 420 ft without rest break       Standardized Balance Assessment   Standardized Balance Assessment  Berg Balance Test      Berg Balance Test   Sit to Stand  Able to stand  independently using hands    Standing Unsupported  Able to stand safely 2 minutes    Sitting with Back Unsupported but Feet Supported on Floor or Stool  Able to sit safely and securely 2 minutes    Stand to Sit   Controls descent by using hands    Transfers  Able to transfer safely, definite need of hands    Standing Unsupported with Eyes Closed  Able to stand 10 seconds safely    Standing Ubsupported with Feet Together  Able to place feet together independently and stand 1 minute safely    From Standing, Reach Forward with Outstretched Arm  Can reach forward >12 cm safely (5")    From Standing Position, Pick up Object from Floor  Able to pick up shoe safely and easily    From Standing Position, Turn to Look Behind Over each Shoulder  Looks behind one side only/other side shows less weight shift    Turn 360 Degrees  Able to turn 360 degrees safely but slowly    Standing Unsupported, Alternately Place Feet on Step/Stool  Needs assistance to keep from falling or unable to try    Standing Unsupported, One Foot in Front  Able to take small step independently and hold 30 seconds    Standing on One Leg  Tries to lift leg/unable to hold 3 seconds but remains standing independently    Total Score  40                Objective measurements completed on examination: See above findings.              PT Education - 06/10/18 1151    Education Details  eval findings/POC; goal establishment    Person(s) Educated  Patient    Methods  Explanation    Comprehension  Verbalized understanding       PT Short Term Goals - 06/10/18 1017      PT SHORT TERM GOAL #1   Title  be independent in initial HEP    Time  4    Period  Weeks    Status  New    Target Date  07/09/18  PT SHORT TERM GOAL #2   Title  Pt will be able to ambulate with improved foot clearance and step length to decrease risk of falling throughout the day.    Time  4    Period  Weeks    Status  New        PT Long Term Goals - 06/10/18 1018      PT LONG TERM GOAL #1   Title  Pt will demo improved LE strength to atleast 4/5 MMT which will improve her mechanics with daily activity.     Time  8    Period  Weeks     Status  New    Target Date  08/12/18      PT LONG TERM GOAL #2   Title  Pt will demonstrate atleast ft increase in her 6 MWT distance, to reflect improvements in her endurance.     Time  8    Period  Weeks    Status  New      PT LONG TERM GOAL #3   Title  Pt will demo atleast 6 point increase in her BERG balance score to reflect improvements in balance and decrease risk of falls.     Time  8    Period  Weeks    Status  New      PT LONG TERM GOAL #4   Title  Pt will be able to carry up to 10 lb object around the clinic x15ft without noted unsteadiness or LOB.     Time  8    Period  Weeks    Status  New      PT LONG TERM GOAL #5   Title  Pt will be able to get up from the floor safely and without the need for cuing from PT, 2/3 trials, to allow her to get up from the ground after gardening.     Time  8    Period  Weeks    Status  New             Plan - 06/10/18 1138    Clinical Impression Statement  Pt is a pleasant 78 y.o F referred to OPPT with concerns over her balance and steadiness with daily activity. She reports decreased independence over the past 6 months, noting several falls without injury. She has history of osteoporosis and scoliosis as well as UE radicular symptoms. She demonstrates significant limitations in hip strength and had poor eccentric control during her 5x sit to stand test. Pt's gait is slow and she was only able to ambulate 420 ft during her 6 minute walk test, indicating she has impairments in endurance compared to age related norms. Her BERG balance score of 40/56 places her at significant risk of falling. She would benefit from skilled PT to address her impairments in LE strength, endurance and proprioception in order to promote independence with self-care and other daily activity.     History and Personal Factors relevant to plan of care:  scoliosis; osteoporosis; history of ulnar nerve issues    Clinical Presentation  Evolving    Clinical Decision  Making  Moderate    Rehab Potential  Good    PT Frequency  2x / week    PT Duration  8 weeks    PT Treatment/Interventions  ADLs/Self Care Home Management;Moist Heat;Gait training;Stair training;Functional mobility training;Therapeutic activities;Therapeutic exercise;Neuromuscular re-education;Balance training;Patient/family education;Manual techniques;Passive range of motion;Dry needling;Taping    PT Next Visit Plan  implement HEP for  LE strength; balance activity and trunk strength progression    PT Home Exercise Plan  next visit     Consulted and Agree with Plan of Care  Patient       Patient will benefit from skilled therapeutic intervention in order to improve the following deficits and impairments:  Abnormal gait, Decreased mobility, Decreased endurance, Decreased strength, Improper body mechanics, Pain, Postural dysfunction, Impaired flexibility, Increased muscle spasms, Difficulty walking  Visit Diagnosis: Muscle weakness (generalized)  Unsteadiness on feet  History of falling     Problem List There are no active problems to display for this patient.  11:52 AM,06/10/18 Sherol Dade PT, [place] [facility] [place]  [facility] [address], [place], [place], [address] Phone: [phone]   Fax:  [phone]  Name: Jacqueline Nash MRN: [id number] Date of Birth: [birth year]

## 2018-06-22 ENCOUNTER — Encounter: Payer: Medicare Other | Admitting: Physical Therapy

## 2018-06-28 ENCOUNTER — Encounter: Payer: Medicare Other | Admitting: Physical Therapy

## 2018-06-30 ENCOUNTER — Encounter: Payer: Medicare Other | Admitting: Physical Therapy

## 2018-07-05 ENCOUNTER — Encounter: Payer: Medicare Other | Admitting: Physical Therapy

## 2018-07-07 ENCOUNTER — Encounter: Payer: Medicare Other | Admitting: Physical Therapy

## 2018-07-11 ENCOUNTER — Encounter: Payer: Medicare Other | Admitting: Physical Therapy

## 2018-07-12 ENCOUNTER — Ambulatory Visit: Payer: Medicare Other

## 2018-07-14 ENCOUNTER — Encounter: Payer: Medicare Other | Admitting: Physical Therapy

## 2018-07-18 ENCOUNTER — Encounter: Payer: Medicare Other | Admitting: Physical Therapy

## 2018-07-19 ENCOUNTER — Telehealth: Payer: Self-pay | Admitting: Physical Therapy

## 2018-07-19 NOTE — Telephone Encounter (Signed)
Spoke with pt regarding office closure. She was appreciative of the call and was agreeable to having her HEP emailed. Therapist provided email address for pt if she has further questions. Pt would also like to have follow up calls to progress HEP and address any other concerns she may have.    12:48 PM,07/19/18 [place], [place] at [place]

## 2018-07-20 ENCOUNTER — Encounter: Payer: Medicare Other | Admitting: Physical Therapy

## 2018-07-25 ENCOUNTER — Telehealth: Payer: Self-pay | Admitting: Physical Therapy

## 2018-07-25 NOTE — Telephone Encounter (Signed)
PT spoke with pt about her HEP and addressed concerns regarding modifications/set up at home. PT reviewed additions to HEP as well a progressions in sets/reps for this week. Pt was agreeable and appreciated the call.   11:03 AM,07/25/18 [place], [place] at [place]

## 2018-07-26 ENCOUNTER — Encounter: Payer: Medicare Other | Admitting: Physical Therapy

## 2018-07-28 ENCOUNTER — Encounter: Payer: Medicare Other | Admitting: Physical Therapy

## 2018-08-02 ENCOUNTER — Encounter: Payer: Medicare Other | Admitting: Physical Therapy

## 2018-08-02 ENCOUNTER — Telehealth: Payer: Self-pay | Admitting: Physical Therapy

## 2018-08-02 NOTE — Telephone Encounter (Signed)
Spoke with pt regarding HEP. She says she has been doing a lot of gardening and her back has been bothering her some. She requested updates to HEP and therapist was able to review these over the phone. She was appreciative of the call.    4:19 PM,08/02/18 [place], [place] at [place]

## 2018-08-04 ENCOUNTER — Encounter: Payer: Medicare Other | Admitting: Physical Therapy

## 2018-08-09 ENCOUNTER — Encounter: Payer: Medicare Other | Admitting: Physical Therapy

## 2018-08-11 ENCOUNTER — Ambulatory Visit: Payer: Medicare Other

## 2018-08-11 ENCOUNTER — Encounter: Payer: Medicare Other | Admitting: Physical Therapy

## 2018-09-14 ENCOUNTER — Other Ambulatory Visit: Payer: Self-pay

## 2018-09-14 ENCOUNTER — Ambulatory Visit: Payer: Medicare Other | Admitting: Physical Therapy

## 2018-09-14 ENCOUNTER — Encounter: Payer: Self-pay | Admitting: Physical Therapy

## 2018-09-14 ENCOUNTER — Ambulatory Visit: Payer: Medicare Other | Attending: Family Medicine | Admitting: Physical Therapy

## 2018-09-14 DIAGNOSIS — R2681 Unsteadiness on feet: Secondary | ICD-10-CM

## 2018-09-14 DIAGNOSIS — R278 Other lack of coordination: Secondary | ICD-10-CM

## 2018-09-14 DIAGNOSIS — M6281 Muscle weakness (generalized): Secondary | ICD-10-CM | POA: Diagnosis not present

## 2018-09-14 DIAGNOSIS — R208 Other disturbances of skin sensation: Secondary | ICD-10-CM | POA: Insufficient documentation

## 2018-09-14 DIAGNOSIS — Z9181 History of falling: Secondary | ICD-10-CM | POA: Diagnosis present

## 2018-09-14 NOTE — Patient Instructions (Signed)
Access Code: [email or web address]  URL: [email or web address]  Date: 09/14/2018  Prepared by: Sherol Dade   Exercises  Standing Hip Abduction - 10 reps - 3 sets - 2x daily - 7x weekly  Supine Active Straight Leg Raise - 10 reps - 2x daily - 7x weekly  Supine Bridge with Resistance Band - 10 reps - 2x daily - 7x weekly  Hooklying Isometric Clamshell - 10 reps - 3 sets - 2x daily - 7x weekly    [facility] [address], [place], [place] [address] Phone # [phone] Fax [phone]

## 2018-09-14 NOTE — Therapy (Signed)
[facility] [address], [place], [place], [address] Phone: [phone]   Fax:  [phone]  Physical Therapy Treatment/Re-evaluation  Patient Details  Name: Jacqueline Nash MRN: [id number] Date of Birth: 03-31-1941 Referring Provider (PT): Marlou Porch, MD    Encounter Date: 09/14/2018  PT End of Session - 09/14/18 1846    Visit Number  2    Date for PT Re-Evaluation  11/09/18    Authorization Type  Medicare A and B     Authorization Time Period  09/14/18 to 11/09/18    Authorization - Visit Number  1    Authorization - Number of Visits  10    PT Start Time  1806    PT Stop Time  1845    PT Time Calculation (min)  39 min    Activity Tolerance  No increased pain;Patient tolerated treatment well    Behavior During Therapy  [facility] for tasks assessed/performed       Past Medical History:  Diagnosis Date  . Cancer ([place])   . Depression   . Hyperlipidemia   . Hypothyroid   . Insomnia   . Migraine   . Osteoporosis   . Peripheral neuropathy   . Scoliosis   . Thyroid cancer ([place]) 1970  . White coat hypertension     History reviewed. No pertinent surgical history.  There were no vitals filed for this visit.  Subjective Assessment - 09/14/18 1808    Subjective  Pt states that she has been trying some of her exercises, but has not been doing them exactly how she is supposed to. No falls.     Pertinent History  osteoporosis; scoliosis    Limitations  Standing;Walking    Patient Stated Goals  improve balance and stamina    Currently in Pain?  No/denies         [facility] PT Assessment - 09/14/18 0001      Assessment   Medical Diagnosis  Gait/balance disorder    Referring Provider (PT)  Marlou Porch, MD     Onset Date/Surgical Date  --   6 months ago   Next MD Visit  not sure     Prior Therapy  PT for back pain 2 years ago       Precautions   Precautions  Fall      Restrictions   Weight Bearing  Restrictions  No      Balance Screen   Has the patient fallen in the past 6 months  Yes    How many times?  several times, none since the evaluation    Has the patient had a decrease in activity level because of a fear of falling?   No    Is the patient reluctant to leave their home because of a fear of falling?   No      Home Film/video editor residence    Additional Comments  single story home       Prior Function   Level of [place] with basic ADLs    Leisure  shopping, likes to garden       Cognition   Overall Cognitive Status  Within Functional Limits for tasks assessed      Sensation   Additional Comments  burning sensation bottom of Lt foot       Strength   Right Hip Flexion  4/5    Right Hip Extension  2+/5    Right  Hip ABduction  2+/5    Left Hip Flexion  4/5    Left Hip Extension  2+/5    Left Hip ABduction  2+/5    Right Knee Extension  5/5    Left Knee Extension  5/5    Right Ankle Dorsiflexion  5/5    Right Ankle Plantar Flexion  --   single leg heel raises less than 5    Left Ankle Dorsiflexion  5/5    Left Ankle Plantar Flexion  --   single leg heel raises less than 5     Transfers   Five time sit to stand comments   12 sec, UE support required and crashing into chair last 2 reps       Ambulation/Gait   Gait Comments  decreased step length, flexed posture, using SPC      6 minute walk test results    Endurance additional comments  6 MWT: SPC 420 ft without rest break       Standardized Balance Assessment   Standardized Balance Assessment  Berg Balance Test      Berg Balance Test   Sit to Stand  Able to stand  independently using hands    Standing Unsupported  Able to stand safely 2 minutes    Sitting with Back Unsupported but Feet Supported on Floor or Stool  Able to sit safely and securely 2 minutes    Stand to Sit  Controls descent by using hands    Transfers  Able to transfer safely, definite need of  hands    Standing Unsupported with Eyes Closed  Able to stand 10 seconds safely    Standing Unsupported with Feet Together  Able to place feet together independently and stand 1 minute safely    From Standing, Reach Forward with Outstretched Arm  Can reach forward >12 cm safely (5")    From Standing Position, Pick up Object from Floor  Able to pick up shoe safely and easily    From Standing Position, Turn to Look Behind Over each Shoulder  Looks behind one side only/other side shows less weight shift    Turn 360 Degrees  Able to turn 360 degrees safely but slowly    Standing Unsupported, Alternately Place Feet on Step/Stool  Able to stand independently and complete 8 steps >20 seconds    Standing Unsupported, One Foot in Front  Able to take small step independently and hold 30 seconds    Standing on One Leg  Tries to lift leg/unable to hold 3 seconds but remains standing independently    Total Score  43                   [facility] Adult PT Treatment/Exercise - 09/14/18 0001      Exercises   Exercises  Knee/Hip      Knee/Hip Exercises: Standing   Hip Flexion  Stengthening;Both;1 set;15 reps    Hip Flexion Limitations  1 UE support     Hip Abduction  Both;1 set;10 reps    Abduction Limitations  cues to decrease hip external rotation on Lt       Knee/Hip Exercises: Supine   Bridges with Clamshell  Both;Strengthening;1 set;10 reps   yellow TB   Straight Leg Raises  Both;Strengthening;1 set    Straight Leg Raises Limitations  to fatigue (~12 reps)              PT Education - 09/14/18 1852    Education Details  updated  and reviewed HEP    Person(s) Educated  Patient    Methods  Explanation;Verbal cues;Handout    Comprehension  Returned demonstration;Verbalized understanding       PT Short Term Goals - 09/14/18 1828      PT SHORT TERM GOAL #1   Title  be independent in initial HEP    Time  4    Period  Weeks    Status  New    Target Date  10/12/18      PT SHORT  TERM GOAL #2   Title  Pt will be able to ambulate with improved foot clearance and step length to decrease risk of falling throughout the day.    Time  4    Period  Weeks    Status  New        PT Long Term Goals - 09/14/18 1829      PT LONG TERM GOAL #1   Title  Pt will demo improved LE strength to atleast 4/5 MMT which will improve her mechanics with daily activity.     Time  8    Period  Weeks    Status  New    Target Date  11/09/18      PT LONG TERM GOAL #2   Title  Pt will demonstrate atleast ft increase in her 6 MWT distance, to reflect improvements in her endurance.     Time  8    Period  Weeks    Status  New      PT LONG TERM GOAL #3   Title  Pt will demo atleast 6 point increase in her BERG balance score to reflect improvements in balance and decrease risk of falls.     Time  8    Period  Weeks    Status  New      PT LONG TERM GOAL #4   Title  Pt will be able to carry up to 10 lb object around the clinic x15ft without noted unsteadiness or LOB.     Time  8    Period  Weeks    Status  New      PT LONG TERM GOAL #5   Title  Pt will be able to get up from the floor safely and without the need for cuing from PT, 2/3 trials, to allow her to get up from the ground after gardening.     Time  8    Period  Weeks    Status  New            Plan - 09/14/18 1829    Clinical Impression Statement  Pt was re-evaluated this visit having been unable to attend any treatments since her evaluation due to COVID-19 closures. She continues to have significant limitations in LE strength, balance and functional mobility. The greatest deficits are noted in hip abductor and extensor muscle strength. She was able to complete 5x sit to stand in 12 sec, however she demonstrates poor eccentric control and unsteadiness with this. Her BERG score is 3 points improved, however a score of 43 still places her at a significant risk of falls. Pt's HEP was updated and reviewed to ensure proper  understanding. She would continue to benefit from skilled PT to properly address her limitations in LE strength, endurance, balance and decrease her risk of falling and injuring herself at home and in the community.     Rehab Potential  Good    PT Frequency  2x /  week    PT Duration  8 weeks    PT Treatment/Interventions  ADLs/Self Care Home Management;Moist Heat;Gait training;Stair training;Functional mobility training;Therapeutic activities;Therapeutic exercise;Neuromuscular re-education;Balance training;Patient/family education;Manual techniques;Passive range of motion;Dry needling;Taping    PT Next Visit Plan  f/u on HEP; trunk strength (rows, etc.); hip abduction closed and open chain; balance activity progression    PT Home Exercise Plan  Access Code: [email or web address]     Consulted and Agree with Plan of Care  Patient       Patient will benefit from skilled therapeutic intervention in order to improve the following deficits and impairments:  Abnormal gait, Decreased mobility, Decreased endurance, Decreased strength, Improper body mechanics, Pain, Postural dysfunction, Impaired flexibility, Increased muscle spasms, Difficulty walking  Visit Diagnosis: Muscle weakness (generalized)  Unsteadiness on feet  History of falling  Other disturbances of skin sensation  Other lack of coordination     Problem List There are no active problems to display for this patient.   6:56 PM,09/14/18 [place], [place] at [place]  [facility] [address] [place], [place], [address] Phone: [phone]   Fax:  [phone]  Name: Clemma Johnsen MRN: [id number] Date of Birth: 1940/11/30

## 2018-09-15 ENCOUNTER — Ambulatory Visit: Payer: Medicare Other

## 2018-09-20 ENCOUNTER — Ambulatory Visit: Payer: Medicare Other | Admitting: Physical Therapy

## 2018-09-27 ENCOUNTER — Encounter: Payer: Self-pay | Admitting: Physical Therapy

## 2018-09-27 ENCOUNTER — Other Ambulatory Visit: Payer: Self-pay

## 2018-09-27 ENCOUNTER — Ambulatory Visit: Payer: Medicare Other | Admitting: Physical Therapy

## 2018-09-27 DIAGNOSIS — R2681 Unsteadiness on feet: Secondary | ICD-10-CM

## 2018-09-27 DIAGNOSIS — M6281 Muscle weakness (generalized): Secondary | ICD-10-CM

## 2018-09-27 DIAGNOSIS — Z9181 History of falling: Secondary | ICD-10-CM

## 2018-09-27 NOTE — Therapy (Signed)
[facility] [address], [place], [place], [address] Phone: [phone]   Fax:  [phone]  Physical Therapy Treatment  Patient Details  Name: Jacqueline Nash MRN: [id number] Date of Birth: [birth year] Referring Provider (PT): Marlou Porch, MD    Encounter Date: 09/27/2018  PT End of Session - 09/27/18 1617    Visit Number  3    Date for PT Re-Evaluation  11/09/18    Authorization Type  Medicare A and B     Authorization Time Period  09/14/18 to 11/09/18    Authorization - Visit Number  2    Authorization - Number of Visits  10    PT Start Time  8101    PT Stop Time  7510    PT Time Calculation (min)  42 min    Activity Tolerance  No increased pain;Patient tolerated treatment well    Behavior During Therapy  [facility] for tasks assessed/performed       Past Medical History:  Diagnosis Date  . Cancer ([place])   . Depression   . Hyperlipidemia   . Hypothyroid   . Insomnia   . Migraine   . Osteoporosis   . Peripheral neuropathy   . Scoliosis   . Thyroid cancer ([place]) 1970  . White coat hypertension     History reviewed. No pertinent surgical history.  There were no vitals filed for this visit.  Subjective Assessment - 09/27/18 1601    Subjective  Pt states that she was able to complete her HEP over the past week. She was sore, but has had no other issues to note.    Pertinent History  osteoporosis; scoliosis    Limitations  Standing;Walking    Patient Stated Goals  improve balance and stamina    Currently in Pain?  No/denies                       [facility] Adult PT Treatment/Exercise - 09/27/18 0001      Exercises   Exercises  Other Exercises    Other Exercises   BUE rows green TB 2x15 reps; BUE pressdown with red TB 2x10 reps       Knee/Hip Exercises: Aerobic   Nustep  L2 x5 min, PT present to discuss progress      Knee/Hip Exercises: Standing   Heel Raises  Both;1 set;20 reps    Heel  Raises Limitations  on foam pad with BUE support     Hip Extension  Stengthening;Both;2 sets;10 reps    Other Standing Knee Exercises  ankle dorsiflexion on slant       Knee/Hip Exercises: Seated   Marching  Strengthening;Both;2 sets    Marching Limitations  10 reps on Lt, 15 reps on Rt       Knee/Hip Exercises: Sidelying   Clams  x12 reps without resistance, yellow TB x10 reps each LE             PT Education - 09/27/18 1707    Education Details  updated and reviewed HEP    Person(s) Educated  Patient    Methods  Explanation;Verbal cues;Handout    Comprehension  Verbalized understanding       PT Short Term Goals - 09/14/18 1828      PT SHORT TERM GOAL #1   Title  be independent in initial HEP    Time  4    Period  Weeks    Status  New  Target Date  10/12/18      PT SHORT TERM GOAL #2   Title  Pt will be able to ambulate with improved foot clearance and step length to decrease risk of falling throughout the day.    Time  4    Period  Weeks    Status  New        PT Long Term Goals - 09/14/18 1829      PT LONG TERM GOAL #1   Title  Pt will demo improved LE strength to atleast 4/5 MMT which will improve her mechanics with daily activity.     Time  8    Period  Weeks    Status  New    Target Date  11/09/18      PT LONG TERM GOAL #2   Title  Pt will demonstrate atleast ft increase in her 6 MWT distance, to reflect improvements in her endurance.     Time  8    Period  Weeks    Status  New      PT LONG TERM GOAL #3   Title  Pt will demo atleast 6 point increase in her BERG balance score to reflect improvements in balance and decrease risk of falls.     Time  8    Period  Weeks    Status  New      PT LONG TERM GOAL #4   Title  Pt will be able to carry up to 10 lb object around the clinic x1110ft without noted unsteadiness or LOB.     Time  8    Period  Weeks    Status  New      PT LONG TERM GOAL #5   Title  Pt will be able to get up from the floor  safely and without the need for cuing from PT, 2/3 trials, to allow her to get up from the ground after gardening.     Time  8    Period  Weeks    Status  New            Plan - 09/27/18 1658    Clinical Impression Statement  Pt arrived today having been consistently working on her HEP over the past week. She was able to complete several of today's exercises without significant difficulty. Pt required intermittent rest breaks and had more difficulty with active hip extension secondary to both hip flexor tightness and glute weakness but was able to do this without significant compensations in posture. Ended session with reports of LE fatigue but no additional concerns.    Rehab Potential  Good    PT Frequency  2x / week    PT Duration  8 weeks    PT Treatment/Interventions  ADLs/Self Care Home Management;Moist Heat;Gait training;Stair training;Functional mobility training;Therapeutic activities;Therapeutic exercise;Neuromuscular re-education;Balance training;Patient/family education;Manual techniques;Passive range of motion;Dry needling;Taping    PT Next Visit Plan  f/u on HEP; trunk strength (rows, etc.); hip abduction closed and open chain; balance activity progression    PT Home Exercise Plan  Access Code: [email or web address]     Consulted and Agree with Plan of Care  Patient       Patient will benefit from skilled therapeutic intervention in order to improve the following deficits and impairments:  Abnormal gait, Decreased mobility, Decreased endurance, Decreased strength, Improper body mechanics, Pain, Postural dysfunction, Impaired flexibility, Increased muscle spasms, Difficulty walking  Visit Diagnosis: 1. Muscle weakness (generalized)   2. Unsteadiness on feet  3. History of falling        Problem List There are no active problems to display for this patient.   5:08 PM,09/27/18 [place], [place] at [place]  [facility] [address], [place], [place], [address] Phone: [phone]   Fax:  [phone]  Name: Jacqueline Nash MRN: [id number] Date of Birth: 05-08-1940

## 2018-09-30 ENCOUNTER — Other Ambulatory Visit: Payer: Self-pay

## 2018-09-30 ENCOUNTER — Encounter: Payer: Self-pay | Admitting: Physical Therapy

## 2018-09-30 ENCOUNTER — Ambulatory Visit: Payer: Medicare Other | Admitting: Physical Therapy

## 2018-09-30 DIAGNOSIS — R2681 Unsteadiness on feet: Secondary | ICD-10-CM

## 2018-09-30 DIAGNOSIS — Z9181 History of falling: Secondary | ICD-10-CM

## 2018-09-30 DIAGNOSIS — M6281 Muscle weakness (generalized): Secondary | ICD-10-CM | POA: Diagnosis not present

## 2018-09-30 NOTE — Patient Instructions (Signed)
Access Code: [email or web address]  URL: [email or web address]  Date: 09/30/2018  Prepared by: Sherol Dade   Exercises  Standing Hip Abduction - 10 reps - 3 sets - 2x daily - 7x weekly  Supine Active Straight Leg Raise - 10 reps - 2x daily - 7x weekly  Supine Bridge with Resistance Band - 10 reps - 2x daily - 7x weekly  Clamshell with Resistance - 10 reps - 1 sets - 2x daily - 7x weekly  Standing Anti-Rotation Press with Anchored Resistance - 15 reps - 2 sets - 1x daily - 7x weekly    [facility] [address] [place], [place] [address] Phone # [phone] Fax [phone]

## 2018-09-30 NOTE — Therapy (Signed)
[facility] [place] [facility] [address], [place], [place], [address] Phone: [phone]   Fax:  [phone]  Physical Therapy Treatment  Patient Details  Name: Jacqueline Nash MRN: [id number] Date of Birth: 06-01-1940 Referring Provider (PT): Marlou Porch, MD    Encounter Date: 09/30/2018  PT End of Session - 09/30/18 1444    Visit Number  4    Date for PT Re-Evaluation  11/09/18    Authorization Type  Medicare A and B     Authorization Time Period  09/14/18 to 11/09/18    Authorization - Visit Number  3    Authorization - Number of Visits  10    PT Start Time  1430    PT Stop Time  1510    PT Time Calculation (min)  40 min    Activity Tolerance  No increased pain;Patient tolerated treatment well    Behavior During Therapy  [facility] for tasks assessed/performed       Past Medical History:  Diagnosis Date  . Cancer ([place])   . Depression   . Hyperlipidemia   . Hypothyroid   . Insomnia   . Migraine   . Osteoporosis   . Peripheral neuropathy   . Scoliosis   . Thyroid cancer ([place]) 1970  . White coat hypertension     History reviewed. No pertinent surgical history.  There were no vitals filed for this visit.  Subjective Assessment - 09/30/18 1432    Subjective  Pt states that things are going well. She was a little sore after her last session and is sore some now as well. HEP is going well.    Pertinent History  osteoporosis; scoliosis    Limitations  Standing;Walking    Patient Stated Goals  improve balance and stamina    Currently in Pain?  No/denies                       [facility] Adult PT Treatment/Exercise - 09/30/18 0001      Knee/Hip Exercises: Standing   Heel Raises  Both;2 sets;10 reps    Heel Raises Limitations  1st on solid, 2nd on half foam roll     Other Standing Knee Exercises  pallof press with red TB and WBOS (mirror feedback) 2x15 reps; hip hike/lower x10 reps (small range)     Other  Standing Knee Exercises  BUE rows 2x15 reps using green TB; BUE pressdown with yellow TB x20      Knee/Hip Exercises: Seated   Marching  Strengthening;Both;2 sets;10 reps    Marching Weights  2 lbs.          Balance Exercises - 09/30/18 1504      Balance Exercises: Standing   Standing Eyes Opened  Narrow base of support (BOS)   trunk rotation NBOS 2x5 reps    Standing, One Foot on a Step  Eyes open;6 inch;3 reps;15 secs        PT Education - 09/30/18 1515    Education Details  technique with therex    Person(s) Educated  Patient    Methods  Explanation;Handout    Comprehension  Verbalized understanding       PT Short Term Goals - 09/14/18 1828      PT SHORT TERM GOAL #1   Title  be independent in initial HEP    Time  4    Period  Weeks    Status  New    Target Date  10/12/18      PT SHORT TERM GOAL #2   Title  Pt will be able to ambulate with improved foot clearance and step length to decrease risk of falling throughout the day.    Time  4    Period  Weeks    Status  New        PT Long Term Goals - 09/14/18 1829      PT LONG TERM GOAL #1   Title  Pt will demo improved LE strength to atleast 4/5 MMT which will improve her mechanics with daily activity.     Time  8    Period  Weeks    Status  New    Target Date  11/09/18      PT LONG TERM GOAL #2   Title  Pt will demonstrate atleast ft increase in her 6 MWT distance, to reflect improvements in her endurance.     Time  8    Period  Weeks    Status  New      PT LONG TERM GOAL #3   Title  Pt will demo atleast 6 point increase in her BERG balance score to reflect improvements in balance and decrease risk of falls.     Time  8    Period  Weeks    Status  New      PT LONG TERM GOAL #4   Title  Pt will be able to carry up to 10 lb object around the clinic x149ft without noted unsteadiness or LOB.     Time  8    Period  Weeks    Status  New      PT LONG TERM GOAL #5   Title  Pt will be able to get up  from the floor safely and without the need for cuing from PT, 2/3 trials, to allow her to get up from the ground after gardening.     Time  8    Period  Weeks    Status  New            Plan - 09/30/18 1515    Clinical Impression Statement  Pt had adequate levels of muscle soreness following last session's exercises. Focused on improving trunk strength this visit, introducing pallof press. She had increased difficulty with maintaining upright posture during this, secondary to her scoliosis and trunk musculature weakness. Mirror feedback helped with this. Pt also had difficulty with hip hikes particularly on the Lt, noting muscle fatigue after 5 repetitions. Will continue with exercise and proprioceptive activity to increase safety with activity at home.    Rehab Potential  Good    PT Frequency  2x / week    PT Duration  8 weeks    PT Treatment/Interventions  ADLs/Self Care Home Management;Moist Heat;Gait training;Stair training;Functional mobility training;Therapeutic activities;Therapeutic exercise;Neuromuscular re-education;Balance training;Patient/family education;Manual techniques;Passive range of motion;Dry needling;Taping    PT Next Visit Plan  update HEP; trunk strength (farmers carry, rows, etc.); hip abduction closed and open chain; balance activity progression    PT Home Exercise Plan  Access Code: [email or web address]     Consulted and Agree with Plan of Care  Patient       Patient will benefit from skilled therapeutic intervention in order to improve the following deficits and impairments:  Abnormal gait, Decreased mobility, Decreased endurance, Decreased strength, Improper body mechanics, Pain, Postural dysfunction, Impaired flexibility, Increased muscle spasms, Difficulty walking  Visit Diagnosis: 1. Unsteadiness on feet   2. Muscle  weakness (generalized)   3. History of falling        Problem List There are no active problems to display for this patient.   3:21  PM,09/30/18 Sherol Dade PT, DPT [place] [facility] [place] [address], [place], [place], [address] Phone: [phone]   Fax:  [phone]  Name: Jacqueline Nash MRN: [id number] Date of Birth: 10-Aug-1940

## 2018-10-04 ENCOUNTER — Ambulatory Visit: Payer: Medicare Other | Admitting: Physical Therapy

## 2018-10-04 ENCOUNTER — Other Ambulatory Visit: Payer: Self-pay

## 2018-10-04 ENCOUNTER — Encounter: Payer: Self-pay | Admitting: Physical Therapy

## 2018-10-04 DIAGNOSIS — M6281 Muscle weakness (generalized): Secondary | ICD-10-CM

## 2018-10-04 DIAGNOSIS — Z9181 History of falling: Secondary | ICD-10-CM

## 2018-10-04 DIAGNOSIS — R208 Other disturbances of skin sensation: Secondary | ICD-10-CM

## 2018-10-04 DIAGNOSIS — R278 Other lack of coordination: Secondary | ICD-10-CM

## 2018-10-04 DIAGNOSIS — R2681 Unsteadiness on feet: Secondary | ICD-10-CM

## 2018-10-04 NOTE — Patient Instructions (Signed)
Access Code: [email or web address]  URL: [email or web address]  Date: 10/04/2018  Prepared by: Sherol Dade   Exercises  Standing Hip Abduction - 10 reps - 2 sets - 2x daily - 7x weekly  Supine Active Straight Leg Raise - 10 reps - 2x daily - 7x weekly  Supine Bridge with Resistance Band - 10 reps - 2x daily - 7x weekly  Clamshell with Resistance - 10 reps - 1 sets - 2x daily - 7x weekly  Standing Anti-Rotation Press with Anchored Resistance - 15 reps - 2 sets - 1x daily - 7x weekly    [facility] [address], [place], [place] [address] Phone # [phone] Fax [phone]

## 2018-10-04 NOTE — Therapy (Signed)
[facility] [address], [place], [place], [address] Phone: [phone]   Fax:  [phone]  Physical Therapy Treatment  Patient Details  Name: Jacqueline Nash MRN: [id number] Date of Birth: [birth year] Referring Provider (PT): Marlou Porch, MD    Encounter Date: 10/04/2018  PT End of Session - 10/04/18 1632    Visit Number  5    Date for PT Re-Evaluation  11/09/18    Authorization Type  Medicare A and B     Authorization Time Period  09/14/18 to 11/09/18    Authorization - Visit Number  4    Authorization - Number of Visits  10    PT Start Time  4818    PT Stop Time  5631    PT Time Calculation (min)  39 min    Activity Tolerance  No increased pain;Patient tolerated treatment well    Behavior During Therapy  [facility] for tasks assessed/performed       Past Medical History:  Diagnosis Date  . Cancer ([place])   . Depression   . Hyperlipidemia   . Hypothyroid   . Insomnia   . Migraine   . Osteoporosis   . Peripheral neuropathy   . Scoliosis   . Thyroid cancer ([place]) 1970  . White coat hypertension     History reviewed. No pertinent surgical history.  There were no vitals filed for this visit.  Subjective Assessment - 10/04/18 1607    Subjective  Pt states that she was not too sore after her last session. Her shoulder bothered her some, and it is better now.    Pertinent History  osteoporosis; scoliosis    Limitations  Standing;Walking    Patient Stated Goals  improve balance and stamina    Currently in Pain?  No/denies                       [facility] Adult PT Treatment/Exercise - 10/04/18 0001      Knee/Hip Exercises: Aerobic   Nustep  L2 x5 min PT present to discuss progres       Knee/Hip Exercises: Standing   Hip Abduction  Stengthening;Both;2 sets;15 reps;Knee straight    Abduction Limitations  side stepping along counter    Other Standing Knee Exercises  farmer walk with 5# 2x25f  with each UE      Knee/Hip Exercises: Seated   Long Arc Quad  Both;2 sets;10 reps    Long Arc Quad Weight  3 lbs.    Marching  Strengthening;Both;2 sets;10 reps    Marching Limitations  3#, 10 sec hold last rep    Sit to Sand  2 sets;5 reps;without UE support   slow eccentric portion             PT Education - 10/04/18 1632    Education Details  technique with therex; updated HEP    Person(s) Educated  Patient    Methods  Explanation;Verbal cues    Comprehension  Verbalized understanding;Returned demonstration       PT Short Term Goals - 10/04/18 1634      PT SHORT TERM GOAL #1   Title  be independent in initial HEP    Time  4    Period  Weeks    Status  Achieved    Target Date  10/12/18      PT SHORT TERM GOAL #2   Title  Pt will be able to ambulate with improved foot clearance  and step length to decrease risk of falling throughout the day.    Baseline  walking has improved, but still issues with negotiating obstacles/steps    Time  4    Period  Weeks    Status  Partially Met        PT Long Term Goals - 10/04/18 1636      PT LONG TERM GOAL #1   Title  Pt will demo improved LE strength to atleast 4/5 MMT which will improve her mechanics with daily activity.     Time  8    Period  Weeks    Status  New      PT LONG TERM GOAL #2   Title  Pt will demonstrate atleast ft increase in her 6 MWT distance, to reflect improvements in her endurance.     Time  8    Period  Weeks    Status  New      PT LONG TERM GOAL #3   Title  Pt will demo atleast 6 point increase in her BERG balance score to reflect improvements in balance and decrease risk of falls.     Time  8    Period  Weeks    Status  New      PT LONG TERM GOAL #4   Title  Pt will be able to carry up to 10 lb object around the clinic x157f without noted unsteadiness or LOB.     Time  8    Period  Weeks    Status  New      PT LONG TERM GOAL #5   Title  Pt will be able to get up from the floor safely  and without the need for cuing from PT, 2/3 trials, to allow her to get up from the ground after gardening.     Time  8    Period  Weeks    Status  New            Plan - 10/04/18 1644    Clinical Impression Statement  Pt is consistently completing her HEP without significant difficulty. Therapist was able to make updates to this, introducing theraband resistance with standing hip abduction. Pt demonstrated hip rotation compensations greater on the Lt LE during this secondary to glute med weakness. She was able to correct following verbal cuing. Pt had difficulty with farmer carry walk secondary to her spinal curvature and required close supervision with this. Ended without pain and pt verbalized understanding of HEP updates.    Rehab Potential  Good    PT Frequency  2x / week    PT Duration  8 weeks    PT Treatment/Interventions  ADLs/Self Care Home Management;Moist Heat;Gait training;Stair training;Functional mobility training;Therapeutic activities;Therapeutic exercise;Neuromuscular re-education;Balance training;Patient/family education;Manual techniques;Passive range of motion;Dry needling;Taping    PT Next Visit Plan  trunk strength (farmers carry, rows, etc.); hip abduction closed and open chain; balance activity progression    PT Home Exercise Plan  Access Code: [email or web address]     Consulted and Agree with Plan of Care  Patient       Patient will benefit from skilled therapeutic intervention in order to improve the following deficits and impairments:  Abnormal gait, Decreased mobility, Decreased endurance, Decreased strength, Improper body mechanics, Pain, Postural dysfunction, Impaired flexibility, Increased muscle spasms, Difficulty walking  Visit Diagnosis: 1. Unsteadiness on feet   2. Muscle weakness (generalized)   3. History of falling   4. Other disturbances of skin sensation  5. Other lack of coordination        Problem List There are no active problems to display for  this patient.   6:50 PM,10/04/18 [place], [place] at [place]  [facility] [address], [place], [place], [address] Phone: [phone]   Fax:  [phone]  Name: Jacqueline Nash MRN: [id number] Date of Birth: 1940-05-19

## 2018-10-07 ENCOUNTER — Encounter: Payer: Self-pay | Admitting: Physical Therapy

## 2018-10-07 ENCOUNTER — Ambulatory Visit: Payer: Medicare Other | Admitting: Physical Therapy

## 2018-10-07 ENCOUNTER — Other Ambulatory Visit: Payer: Self-pay

## 2018-10-07 DIAGNOSIS — R2681 Unsteadiness on feet: Secondary | ICD-10-CM

## 2018-10-07 DIAGNOSIS — Z9181 History of falling: Secondary | ICD-10-CM

## 2018-10-07 DIAGNOSIS — R208 Other disturbances of skin sensation: Secondary | ICD-10-CM

## 2018-10-07 DIAGNOSIS — M6281 Muscle weakness (generalized): Secondary | ICD-10-CM

## 2018-10-07 NOTE — Therapy (Signed)
[facility] [address], [place], [place], [address] Phone: [phone]   Fax:  [phone]  Physical Therapy Treatment  Patient Details  Name: Jacqueline Nash MRN: [id number] Date of Birth: 10/19/1940 Referring Provider (PT): Marlou Porch, MD    Encounter Date: 10/07/2018  PT End of Session - 10/07/18 1617    Visit Number  6    Date for PT Re-Evaluation  11/09/18    Authorization Type  Medicare A and B     Authorization Time Period  09/14/18 to 11/09/18    Authorization - Visit Number  5    Authorization - Number of Visits  10    PT Start Time  4431    PT Stop Time  5400    PT Time Calculation (min)  44 min    Activity Tolerance  No increased pain;Patient tolerated treatment well    Behavior During Therapy  [facility] for tasks assessed/performed       Past Medical History:  Diagnosis Date  . Cancer ([place])   . Depression   . Hyperlipidemia   . Hypothyroid   . Insomnia   . Migraine   . Osteoporosis   . Peripheral neuropathy   . Scoliosis   . Thyroid cancer ([place]) 1970  . White coat hypertension     History reviewed. No pertinent surgical history.  There were no vitals filed for this visit.  Subjective Assessment - 10/07/18 1531    Subjective  Pt states that she was very sore the day following her last session. She is feeling much better now though.    Pertinent History  osteoporosis; scoliosis    Limitations  Standing;Walking    Patient Stated Goals  improve balance and stamina    Currently in Pain?  No/denies         [facility] PT Assessment - 10/07/18 0001      Berg Balance Test   Sit to Stand  Able to stand without using hands and stabilize independently    Standing Unsupported  Able to stand safely 2 minutes    Sitting with Back Unsupported but Feet Supported on Floor or Stool  Able to sit safely and securely 2 minutes    Stand to Sit  Sits safely with minimal use of hands    Transfers  Able to  transfer safely, minor use of hands    Standing Unsupported with Eyes Closed  Able to stand 10 seconds safely    Standing Unsupported with Feet Together  Able to place feet together independently and stand 1 minute safely    From Standing, Reach Forward with Outstretched Arm  Can reach confidently >25 cm (10")    From Standing Position, Pick up Object from Floor  Able to pick up shoe safely and easily    From Standing Position, Turn to Look Behind Over each Shoulder  Looks behind from both sides and weight shifts well    Turn 360 Degrees  Able to turn 360 degrees safely but slowly    Standing Unsupported, Alternately Place Feet on Step/Stool  Able to stand independently and complete 8 steps >20 seconds    Standing Unsupported, One Foot in Front  Able to plae foot ahead of the other independently and hold 30 seconds    Standing on One Leg  Tries to lift leg/unable to hold 3 seconds but remains standing independently   Rt 2 sec, Lt 3 sec    Total Score  49  [place] Adult PT Treatment/Exercise - 10/07/18 0001      Knee/Hip Exercises: Stretches   Hip Flexor Stretch  2 reps;Both;30 seconds    Hip Flexor Stretch Limitations  supine leg off table       Knee/Hip Exercises: Seated   Marching  Strengthening;Both;2 sets;10 reps    Marching Limitations  3#, 10 sec hold last rep    Hamstring Curl  Both;2 sets;10 reps    Hamstring Limitations  yellow TB      Knee/Hip Exercises: Supine   Other Supine Knee/Hip Exercises  straight leg hip extension isometric 10x5 sec each LE       Knee/Hip Exercises: Sidelying   Clams  red TB x10 reps each side              PT Education - 10/07/18 1617    Education Details  technique with therex; updated and reviewed HEP    Person(s) Educated  Patient    Methods  Explanation;Handout    Comprehension  Verbalized understanding;Returned demonstration       PT Short Term Goals - 10/04/18 1634      PT SHORT TERM GOAL #1   Title   be independent in initial HEP    Time  4    Period  Weeks    Status  Achieved    Target Date  10/12/18      PT SHORT TERM GOAL #2   Title  Pt will be able to ambulate with improved foot clearance and step length to decrease risk of falling throughout the day.    Baseline  walking has improved, but still issues with negotiating obstacles/steps    Time  4    Period  Weeks    Status  Partially Met        PT Long Term Goals - 10/07/18 1620      PT LONG TERM GOAL #1   Title  Pt will demo improved LE strength to atleast 4/5 MMT which will improve her mechanics with daily activity.     Time  8    Period  Weeks    Status  New      PT LONG TERM GOAL #2   Title  Pt will demonstrate atleast ft increase in her 6 MWT distance, to reflect improvements in her endurance.     Time  8    Period  Weeks    Status  New      PT LONG TERM GOAL #3   Title  Pt will demo atleast 6 point increase in her BERG balance score to reflect improvements in balance and decrease risk of falls.     Baseline  6 points    Time  8    Period  Weeks    Status  On-going      PT LONG TERM GOAL #4   Title  Pt will be able to carry up to 10 lb object around the clinic x171f without noted unsteadiness or LOB.     Time  8    Period  Weeks    Status  New      PT LONG TERM GOAL #5   Title  Pt will be able to get up from the floor safely and without the need for cuing from PT, 2/3 trials, to allow her to get up from the ground after gardening.     Time  8    Period  Weeks    Status  New  Plan - 10/07/18 1618    Clinical Impression Statement  Pt is making steady progress towards her goals. Her BERG balance score has improved by 6 points since her evaluation. She has been consistently completing her HEP, and this was updated during today's visit. Pt did have increased soreness following her last session, so more frequent rest breaks and stretching were incorporated into today's exercises. Will  continue with current POC moving forward.    Rehab Potential  Good    PT Frequency  2x / week    PT Duration  8 weeks    PT Treatment/Interventions  ADLs/Self Care Home Management;Moist Heat;Gait training;Stair training;Functional mobility training;Therapeutic activities;Therapeutic exercise;Neuromuscular re-education;Balance training;Patient/family education;Manual techniques;Passive range of motion;Dry needling;Taping    PT Next Visit Plan  trunk strength (farmers carry, rows, etc.); hip abduction closed and open chain; balance activity progression    PT Home Exercise Plan  Access Code: [email or web address]     Consulted and Agree with Plan of Care  Patient       Patient will benefit from skilled therapeutic intervention in order to improve the following deficits and impairments:  Abnormal gait, Decreased mobility, Decreased endurance, Decreased strength, Improper body mechanics, Pain, Postural dysfunction, Impaired flexibility, Increased muscle spasms, Difficulty walking  Visit Diagnosis: 1. Unsteadiness on feet   2. Muscle weakness (generalized)   3. History of falling   4. Other disturbances of skin sensation        Problem List There are no active problems to display for this patient.   4:21 PM,10/07/18 Sherol Dade PT, [place] at [place] [address], [place], [place], [address] Phone: [phone]   Fax:  [phone]  Name: Jacqueline Nash MRN: [id number] Date of Birth: 12-20-1940

## 2018-10-07 NOTE — Patient Instructions (Signed)
Access Code: [email or web address]  URL: [email or web address]  Date: 10/07/2018  Prepared by: Sherol Dade   Exercises  Standing Hip Abduction - 10 reps - 2 sets - 2x daily - 7x weekly  Supine Bridge with Resistance Band - 10 reps - 2x daily - 7x weekly  Clamshell with Resistance - 10 reps - 1 sets - 2x daily - 7x weekly  Standing Anti-Rotation Press with Anchored Resistance - 15 reps - 2 sets - 2x daily - 7x weekly  Sit to Stand with Arms Crossed - 5-10 reps - 2x daily - 7x weekly  Supine Hip Extension with Towel Roll (BKA) - 10 reps - 3 sets - 5 hold - 1x daily - 7x weekly    [facility] [address] [place], [place] [address] Phone # [phone] Fax [phone]

## 2018-10-11 ENCOUNTER — Telehealth: Payer: Self-pay | Admitting: Physical Therapy

## 2018-10-11 ENCOUNTER — Ambulatory Visit: Payer: Medicare Other | Admitting: Physical Therapy

## 2018-10-11 NOTE — Telephone Encounter (Signed)
No show. Left voicemail for pt to call office at hear earliest convenience.  2:17 PM,10/11/18 [place], [place] at [place]

## 2018-10-18 ENCOUNTER — Other Ambulatory Visit: Payer: Self-pay

## 2018-10-18 ENCOUNTER — Ambulatory Visit: Payer: Medicare Other | Attending: Family Medicine | Admitting: Physical Therapy

## 2018-10-18 ENCOUNTER — Encounter: Payer: Self-pay | Admitting: Physical Therapy

## 2018-10-18 DIAGNOSIS — R208 Other disturbances of skin sensation: Secondary | ICD-10-CM | POA: Diagnosis present

## 2018-10-18 DIAGNOSIS — R2681 Unsteadiness on feet: Secondary | ICD-10-CM | POA: Diagnosis not present

## 2018-10-18 DIAGNOSIS — Z9181 History of falling: Secondary | ICD-10-CM

## 2018-10-18 DIAGNOSIS — M6281 Muscle weakness (generalized): Secondary | ICD-10-CM | POA: Diagnosis present

## 2018-10-18 NOTE — Patient Instructions (Signed)
Access Code: [email or web address]  URL: [email or web address]  Date: 10/18/2018  Prepared by: Sherol Dade   Exercises  Supine Bridge with Resistance Band - 10 reps - 2x daily - 7x weekly  Clamshell with Resistance - 10 reps - 1 sets - 2x daily - 7x weekly  Standing Anti-Rotation Press with Anchored Resistance - 15 reps - 2 sets - 2x daily - 7x weekly  Sit to Stand with Arms Crossed - 5-10 reps - 2x daily - 7x weekly  Supine Hip Extension with Towel Roll (BKA) - 10 reps - 3 sets - 5 hold - 1x daily - 7x weekly  Side Stepping with Resistance at Feet - 10 reps - 2 sets - 1x daily - 7x weekly  Seated Shoulder Row with Anchored Resistance - 15 reps - 2 sets - 1x daily - 7x weekly    [facility] [address], [place], [place] [address] Phone # [phone] Fax [phone]

## 2018-10-18 NOTE — Therapy (Signed)
[facility] [address] [place], [place], [address] Phone: [phone]   Fax:  [phone]  Physical Therapy Treatment  Patient Details  Name: Jacqueline Nash MRN: [id number] Date of Birth: 06/23/1940 Referring Provider (PT): Marlou Porch, MD    Encounter Date: 10/18/2018  PT End of Session - 10/18/18 1402    Visit Number  7    Date for PT Re-Evaluation  11/09/18    Authorization Type  Medicare A and B     Authorization Time Period  09/14/18 to 11/09/18    Authorization - Visit Number  5    Authorization - Number of Visits  10    PT Start Time  6283    PT Stop Time  1440    PT Time Calculation (min)  48 min    Activity Tolerance  No increased pain;Patient tolerated treatment well    Behavior During Therapy  [facility] for tasks assessed/performed       Past Medical History:  Diagnosis Date  . Cancer ([place])   . Depression   . Hyperlipidemia   . Hypothyroid   . Insomnia   . Migraine   . Osteoporosis   . Peripheral neuropathy   . Scoliosis   . Thyroid cancer ([place]) 1970  . White coat hypertension     History reviewed. No pertinent surgical history.  There were no vitals filed for this visit.  Subjective Assessment - 10/18/18 1355    Subjective  Pt states that things are going well. She has some issues with her HEP.    Pertinent History  osteoporosis; scoliosis    Limitations  Standing;Walking    Patient Stated Goals  improve balance and stamina    Currently in Pain?  No/denies                       [facility] Adult PT Treatment/Exercise - 10/18/18 0001      Knee/Hip Exercises: Aerobic   Nustep  L2 x6 min, PT present to discuss HEP concerns       Knee/Hip Exercises: Machines for Strengthening   Total Gym Leg Press  Seat 7, incline 3 single leg #40 x12 reps each       Knee/Hip Exercises: Standing   Other Standing Knee Exercises  standing hip hike/lower 2x5 reps     Other Standing Knee  Exercises  BUE rows with green TB 2x15 reps; BUE pressdown with yellow TB 2x10 reps       Knee/Hip Exercises: Seated   Hamstring Curl  Both;Strengthening;1 set;15 reps    Hamstring Limitations  red TB      Knee/Hip Exercises: Sidelying   Hip ABduction  Strengthening;Both;1 set;10 reps    Hip ABduction Limitations  therapist cuing to decrease trunk rotation             PT Education - 10/18/18 1451    Education Details  updated HEP    Person(s) Educated  Patient    Methods  Explanation;Handout    Comprehension  Verbalized understanding;Returned demonstration       PT Short Term Goals - 10/04/18 1634      PT SHORT TERM GOAL #1   Title  be independent in initial HEP    Time  4    Period  Weeks    Status  Achieved    Target Date  10/12/18      PT SHORT TERM GOAL #2   Title  Pt will be  able to ambulate with improved foot clearance and step length to decrease risk of falling throughout the day.    Baseline  walking has improved, but still issues with negotiating obstacles/steps    Time  4    Period  Weeks    Status  Partially Met        PT Long Term Goals - 10/07/18 1620      PT LONG TERM GOAL #1   Title  Pt will demo improved LE strength to atleast 4/5 MMT which will improve her mechanics with daily activity.     Time  8    Period  Weeks    Status  New      PT LONG TERM GOAL #2   Title  Pt will demonstrate atleast ft increase in her 6 MWT distance, to reflect improvements in her endurance.     Time  8    Period  Weeks    Status  New      PT LONG TERM GOAL #3   Title  Pt will demo atleast 6 point increase in her BERG balance score to reflect improvements in balance and decrease risk of falls.     Baseline  6 points    Time  8    Period  Weeks    Status  On-going      PT LONG TERM GOAL #4   Title  Pt will be able to carry up to 10 lb object around the clinic x124f without noted unsteadiness or LOB.     Time  8    Period  Weeks    Status  New      PT  LONG TERM GOAL #5   Title  Pt will be able to get up from the floor safely and without the need for cuing from PT, 2/3 trials, to allow her to get up from the ground after gardening.     Time  8    Period  Weeks    Status  New            Plan - 10/18/18 1451    Clinical Impression Statement  Pt arrived with some concerns over her HEP. Therapist was able to review this for better understanding. Introduced leg press machine today along with several other exercise progressions. Pt was able to complete pallof press in partial tandem, requiring only minimal cuing for trunk posture correction. This has been an evident improvement from prior sessions, without LOB in a more narrowed base of support. Pt did demonstrate fatigue with hip abductor strengthening, but she denied any significant increase in muscle soreness end of session. Will continue with current POC.    Rehab Potential  Good    PT Frequency  2x / week    PT Duration  8 weeks    PT Treatment/Interventions  ADLs/Self Care Home Management;Moist Heat;Gait training;Stair training;Functional mobility training;Therapeutic activities;Therapeutic exercise;Neuromuscular re-education;Balance training;Patient/family education;Manual techniques;Passive range of motion;Dry needling;Taping    PT Next Visit Plan  change pallof press at home?; increase leg press sets; side stepping with band at toes; balance activity progression    PT Home Exercise Plan  Access Code: [email or web address]     Consulted and Agree with Plan of Care  Patient       Patient will benefit from skilled therapeutic intervention in order to improve the following deficits and impairments:  Abnormal gait, Decreased mobility, Decreased endurance, Decreased strength, Improper body mechanics, Pain, Postural dysfunction, Impaired flexibility, Increased muscle spasms, Difficulty  walking  Visit Diagnosis: 1. Unsteadiness on feet   2. Muscle weakness (generalized)   3. History of falling   4.  Other disturbances of skin sensation        Problem List There are no active problems to display for this patient.    2:57 PM,10/18/18 [place], [place] at [place]  [facility] [address], [place], [place], [address] Phone: [phone]   Fax:  [phone]  Name: Jacqueline Nash MRN: [id number] Date of Birth: 1941-02-15

## 2018-10-21 ENCOUNTER — Encounter: Payer: Self-pay | Admitting: Physical Therapy

## 2018-10-21 ENCOUNTER — Other Ambulatory Visit: Payer: Self-pay

## 2018-10-21 ENCOUNTER — Ambulatory Visit: Payer: Medicare Other | Admitting: Physical Therapy

## 2018-10-21 DIAGNOSIS — R208 Other disturbances of skin sensation: Secondary | ICD-10-CM

## 2018-10-21 DIAGNOSIS — Z9181 History of falling: Secondary | ICD-10-CM

## 2018-10-21 DIAGNOSIS — M6281 Muscle weakness (generalized): Secondary | ICD-10-CM

## 2018-10-21 DIAGNOSIS — R2681 Unsteadiness on feet: Secondary | ICD-10-CM | POA: Diagnosis not present

## 2018-10-21 NOTE — Therapy (Signed)
[facility] [address], [place], [place], [address] Phone: [phone]   Fax:  [phone]  Physical Therapy Treatment  Patient Details  Name: Jacqueline Nash MRN: [id number] Date of Birth: [birth year] Referring Provider (PT): Marlou Porch, MD    Encounter Date: 10/21/2018  PT End of Session - 10/21/18 1501    Visit Number  8    Date for PT Re-Evaluation  11/09/18    Authorization Type  Medicare A and B     Authorization Time Period  09/14/18 to 11/09/18    Authorization - Visit Number  6    Authorization - Number of Visits  10    PT Start Time  0258    PT Stop Time  5277    PT Time Calculation (min)  47 min    Activity Tolerance  No increased pain;Patient tolerated treatment well    Behavior During Therapy  [facility] for tasks assessed/performed       Past Medical History:  Diagnosis Date  . Cancer ([place])   . Depression   . Hyperlipidemia   . Hypothyroid   . Insomnia   . Migraine   . Osteoporosis   . Peripheral neuropathy   . Scoliosis   . Thyroid cancer ([place]) 1970  . White coat hypertension     History reviewed. No pertinent surgical history.  There were no vitals filed for this visit.  Subjective Assessment - 10/21/18 1426    Subjective  Pt states that she felt good after her last session. She was able to work on her garden the following day without feeling sore.    Pertinent History  osteoporosis; scoliosis    Limitations  Standing;Walking    Patient Stated Goals  improve balance and stamina    Currently in Pain?  No/denies                       [facility] Adult PT Treatment/Exercise - 10/21/18 0001      Knee/Hip Exercises: Aerobic   Nustep  L2 x6 min, pt encouraged to maintain SPM above 75      Knee/Hip Exercises: Machines for Strengthening   Total Gym Leg Press  Seat 7, incline 3 single leg #45 2x10 reps each       Knee/Hip Exercises: Seated   Other Seated Knee/Hip Exercises   BUE rows with green TB x15 reps     Hamstring Curl  Both;Strengthening;1 set;15 reps    Hamstring Limitations  green TB          Balance Exercises - 10/21/18 1458      Balance Exercises: Standing   Standing Eyes Opened  Narrow base of support (BOS);Foam/compliant surface   trunk rotation holding blue weighted ball x10 reps    SLS  Eyes open;Intermittent upper extremity support;2 reps;10 secs   LE propped on foam roll    Other Standing Exercises  NBOS pallof press with green TB x12 reps each side         PT Education - 10/21/18 1516    Education Details  updated resistance bands with HEP    Person(s) Educated  Patient    Methods  Explanation    Comprehension  Verbalized understanding;Returned demonstration       PT Short Term Goals - 10/04/18 1634      PT SHORT TERM GOAL #1   Title  be independent in initial HEP    Time  4    Period  Weeks    Status  Achieved    Target Date  10/12/18      PT SHORT TERM GOAL #2   Title  Pt will be able to ambulate with improved foot clearance and step length to decrease risk of falling throughout the day.    Baseline  walking has improved, but still issues with negotiating obstacles/steps    Time  4    Period  Weeks    Status  Partially Met        PT Long Term Goals - 10/07/18 1620      PT LONG TERM GOAL #1   Title  Pt will demo improved LE strength to atleast 4/5 MMT which will improve her mechanics with daily activity.     Time  8    Period  Weeks    Status  New      PT LONG TERM GOAL #2   Title  Pt will demonstrate atleast ft increase in her 6 MWT distance, to reflect improvements in her endurance.     Time  8    Period  Weeks    Status  New      PT LONG TERM GOAL #3   Title  Pt will demo atleast 6 point increase in her BERG balance score to reflect improvements in balance and decrease risk of falls.     Baseline  6 points    Time  8    Period  Weeks    Status  On-going      PT LONG TERM GOAL #4   Title  Pt will  be able to carry up to 10 lb object around the clinic x173f without noted unsteadiness or LOB.     Time  8    Period  Weeks    Status  New      PT LONG TERM GOAL #5   Title  Pt will be able to get up from the floor safely and without the need for cuing from PT, 2/3 trials, to allow her to get up from the ground after gardening.     Time  8    Period  Weeks    Status  New            Plan - 10/21/18 1512    Clinical Impression Statement  Pt continues to make progress, noting she was able to garden all day without any LOB or increase in soreness yesterday. Focused on progressing pt's HEP, increasing resistance with her rows and pallof press. Pt did require intermittent cuing to decrease shoulder shrug on the Lt, but she was able to complete all exercise progressions without significant difficulty. She would continue to benefit from skilled PT to promote LE strength, trunk strength and proprioception with daily activity.    Rehab Potential  Good    PT Frequency  2x / week    PT Duration  8 weeks    PT Treatment/Interventions  ADLs/Self Care Home Management;Moist Heat;Gait training;Stair training;Functional mobility training;Therapeutic activities;Therapeutic exercise;Neuromuscular re-education;Balance training;Patient/family education;Manual techniques;Passive range of motion;Dry needling;Taping    PT Next Visit Plan  f/u on sidestep changes; increase leg press weight; single leg balance and balance activity progression    PT Home Exercise Plan  Access Code: [email or web address]     Consulted and Agree with Plan of Care  Patient       Patient will benefit from skilled therapeutic intervention in order to improve the following deficits and impairments:  Abnormal gait, Decreased mobility,  Decreased endurance, Decreased strength, Improper body mechanics, Pain, Postural dysfunction, Impaired flexibility, Increased muscle spasms, Difficulty walking  Visit Diagnosis: 1. Unsteadiness on feet   2. History  of falling   3. Muscle weakness (generalized)   4. Other disturbances of skin sensation        Problem List There are no active problems to display for this patient.   3:16 PM,10/21/18 [place], [place] at [place]  [facility] [address], [place], [place], [address] Phone: [phone]   Fax:  [phone]  Name: Jacqueline Nash MRN: [id number] Date of Birth: 1940-06-21

## 2018-10-25 ENCOUNTER — Other Ambulatory Visit: Payer: Self-pay

## 2018-10-25 ENCOUNTER — Encounter: Payer: Self-pay | Admitting: Physical Therapy

## 2018-10-25 ENCOUNTER — Ambulatory Visit: Payer: Medicare Other | Admitting: Physical Therapy

## 2018-10-25 DIAGNOSIS — R2681 Unsteadiness on feet: Secondary | ICD-10-CM | POA: Diagnosis not present

## 2018-10-25 DIAGNOSIS — R208 Other disturbances of skin sensation: Secondary | ICD-10-CM

## 2018-10-25 DIAGNOSIS — Z9181 History of falling: Secondary | ICD-10-CM

## 2018-10-25 DIAGNOSIS — M6281 Muscle weakness (generalized): Secondary | ICD-10-CM

## 2018-10-25 NOTE — Therapy (Signed)
[facility] [address] [place], [place], [address] Phone: [phone]   Fax:  [phone]  Physical Therapy Treatment  Patient Details  Name: Jacqueline Nash MRN: [id number] Date of Birth: [birth year] Referring Provider (PT): Marlou Porch, MD    Encounter Date: 10/25/2018  PT End of Session - 10/25/18 1450    Visit Number  9    Date for PT Re-Evaluation  11/09/18    Authorization Type  Medicare A and B     Authorization Time Period  09/14/18 to 11/09/18    Authorization - Visit Number  7    Authorization - Number of Visits  10    PT Start Time  1400    PT Stop Time  1440    PT Time Calculation (min)  40 min    Activity Tolerance  No increased pain;Patient tolerated treatment well    Behavior During Therapy  [facility] for tasks assessed/performed       Past Medical History:  Diagnosis Date  . Cancer ([place])   . Depression   . Hyperlipidemia   . Hypothyroid   . Insomnia   . Migraine   . Osteoporosis   . Peripheral neuropathy   . Scoliosis   . Thyroid cancer ([place]) 1970  . White coat hypertension     History reviewed. No pertinent surgical history.  There were no vitals filed for this visit.  Subjective Assessment - 10/25/18 1402    Subjective  Pt states things are going better today. Yesterday she didn't feel like she had the energy to do much of anything.    Pertinent History  osteoporosis; scoliosis    Limitations  Standing;Walking    Patient Stated Goals  improve balance and stamina    Currently in Pain?  No/denies                       [facility] Adult PT Treatment/Exercise - 10/25/18 0001      Knee/Hip Exercises: Machines for Strengthening   Total Gym Leg Press  Seat 6, incline 3 2x12 reps       Knee/Hip Exercises: Standing   Knee Flexion  Strengthening;Both;1 set;10 reps    Knee Flexion Limitations  2#    Hip Extension  Stengthening;Both;2 sets;10 reps    Extension Limitations   2nd set with 2#      Knee/Hip Exercises: Seated   Abduction/Adduction   Both;Strengthening;2 sets;10 reps    Abd/Adduction Limitations  green TB          Balance Exercises - 10/25/18 1416      Balance Exercises: Standing   Standing Eyes Opened  Narrow base of support (BOS);Foam/compliant surface    Standing Eyes Closed  --   trunk rotation x10 holding blue weighted ball    SLS  3 reps;10 secs   excessive trendelenburg at 5 sec    Other Standing Exercises  heel raises and toe raises on foam pad  x15 reps         PT Education - 10/25/18 1450    Education Details  technique with therex    Person(s) Educated  Patient    Methods  Explanation;Verbal cues    Comprehension  Verbalized understanding       PT Short Term Goals - 10/04/18 1634      PT SHORT TERM GOAL #1   Title  be independent in initial HEP    Time  4    Period  Weeks    Status  Achieved    Target Date  10/12/18      PT SHORT TERM GOAL #2   Title  Pt will be able to ambulate with improved foot clearance and step length to decrease risk of falling throughout the day.    Baseline  walking has improved, but still issues with negotiating obstacles/steps    Time  4    Period  Weeks    Status  Partially Met        PT Long Term Goals - 10/07/18 1620      PT LONG TERM GOAL #1   Title  Pt will demo improved LE strength to atleast 4/5 MMT which will improve her mechanics with daily activity.     Time  8    Period  Weeks    Status  New      PT LONG TERM GOAL #2   Title  Pt will demonstrate atleast ft increase in her 6 MWT distance, to reflect improvements in her endurance.     Time  8    Period  Weeks    Status  New      PT LONG TERM GOAL #3   Title  Pt will demo atleast 6 point increase in her BERG balance score to reflect improvements in balance and decrease risk of falls.     Baseline  6 points    Time  8    Period  Weeks    Status  On-going      PT LONG TERM GOAL #4   Title  Pt will be able to  carry up to 10 lb object around the clinic x129f without noted unsteadiness or LOB.     Time  8    Period  Weeks    Status  New      PT LONG TERM GOAL #5   Title  Pt will be able to get up from the floor safely and without the need for cuing from PT, 2/3 trials, to allow her to get up from the ground after gardening.     Time  8    Period  Weeks    Status  New            Plan - 10/25/18 1450    Clinical Impression Statement  Pt continues to work diligently on her HEP. Therapist addresses some concerns with her HEP this session to ensure there was better understanding. Pt had increased Trendelenburg this session during standing therex. She was unable to maintain single leg balance with 1 UE support for longer than 5 sec without significant contralateral hip drop. Noted poor single leg stability during hip extension, however this was improved with the addition of ankle weights. Ended without increase in pain. Will continue with current POC.    Rehab Potential  Good    PT Frequency  2x / week    PT Duration  8 weeks    PT Treatment/Interventions  ADLs/Self Care Home Management;Moist Heat;Gait training;Stair training;Functional mobility training;Therapeutic activities;Therapeutic exercise;Neuromuscular re-education;Balance training;Patient/family education;Manual techniques;Passive range of motion;Dry needling;Taping    PT Next Visit Plan  hip abduction strength; increase leg press weight; single leg balance and balance activity progression    PT Home Exercise Plan  Access Code: [email or web address]     Consulted and Agree with Plan of Care  Patient       Patient will benefit from skilled therapeutic intervention in order to improve the following deficits and impairments:  Abnormal gait, Decreased mobility, Decreased endurance, Decreased strength, Improper body mechanics, Pain, Postural dysfunction, Impaired flexibility, Increased muscle spasms, Difficulty walking  Visit Diagnosis: 1. Unsteadiness  on feet   2. History of falling   3. Muscle weakness (generalized)   4. Other disturbances of skin sensation        Problem List There are no active problems to display for this patient.   2:54 PM,10/25/18 [place], [place] at [place]  [facility] [address] [place], [place], [address] Phone: [phone]   Fax:  [phone]  Name: Fionna Merriott MRN: [id number] Date of Birth: Feb 04, 1941

## 2018-10-27 ENCOUNTER — Ambulatory Visit
Admission: RE | Admit: 2018-10-27 | Discharge: 2018-10-27 | Disposition: A | Discharge status: Home / Self Care | Payer: Medicare Other | Source: Ambulatory Visit | Attending: Family Medicine | Admitting: Family Medicine

## 2018-10-27 ENCOUNTER — Other Ambulatory Visit: Payer: Self-pay

## 2018-10-27 DIAGNOSIS — Z1231 Encounter for screening mammogram for malignant neoplasm of breast: Secondary | ICD-10-CM

## 2018-10-28 ENCOUNTER — Encounter: Payer: Self-pay | Admitting: Physical Therapy

## 2018-10-28 ENCOUNTER — Ambulatory Visit: Payer: Medicare Other | Admitting: Physical Therapy

## 2018-10-28 DIAGNOSIS — Z9181 History of falling: Secondary | ICD-10-CM

## 2018-10-28 DIAGNOSIS — M6281 Muscle weakness (generalized): Secondary | ICD-10-CM

## 2018-10-28 DIAGNOSIS — R208 Other disturbances of skin sensation: Secondary | ICD-10-CM

## 2018-10-28 DIAGNOSIS — R2681 Unsteadiness on feet: Secondary | ICD-10-CM

## 2018-10-28 NOTE — Therapy (Signed)
[facility] [address], [place], [place], [address] Phone: [phone]   Fax:  [phone]  Physical Therapy Treatment  Patient Details  Name: Jacqueline Nash MRN: [id number] Date of Birth: 05-Feb-1941 Referring Provider (PT): Marlou Porch, MD    Encounter Date: 10/28/2018  PT End of Session - 10/28/18 1435    Visit Number  9    Date for PT Re-Evaluation  11/09/18    Authorization Type  Medicare A and B     Authorization Time Period  09/14/18 to 11/09/18    Authorization - Visit Number  8    Authorization - Number of Visits  10    PT Start Time  1886    PT Stop Time  1512    PT Time Calculation (min)  40 min    Activity Tolerance  No increased pain;Patient tolerated treatment well    Behavior During Therapy  [facility] for tasks assessed/performed       Past Medical History:  Diagnosis Date  . Cancer ([place])   . Depression   . Hyperlipidemia   . Hypothyroid   . Insomnia   . Migraine   . Osteoporosis   . Peripheral neuropathy   . Scoliosis   . Thyroid cancer ([place]) 1970  . White coat hypertension     History reviewed. No pertinent surgical history.  There were no vitals filed for this visit.  Subjective Assessment - 10/28/18 1437    Subjective  Pt states that things are going well. She is pleased that she was able to sit on her piano bench and play without any pain for the first time in a long time.    Pertinent History  osteoporosis; scoliosis    Limitations  Standing;Walking    Patient Stated Goals  improve balance and stamina    Currently in Pain?  No/denies                       [facility] Adult PT Treatment/Exercise - 10/28/18 0001      Knee/Hip Exercises: Seated   Marching  Strengthening;Both;2 sets;10 reps    Marching Limitations  4#, 10 sec hold last rep      Nustep L2 x6 min, PT present to discuss functional improvements at home    Balance Exercises - 10/28/18 1453      Balance Exercises: Standing   Tandem Stance  Eyes open;2 reps;20 secs   RLE forward easier than Lt    Sidestepping  2 reps   floor ladder   Other Standing Exercises  forward step over pool noodle 2x5 reps last set with [facility]; forward walking floor ladder x2 trials      Balance Exercises: Seated   Other Seated Exercises  trunk rotation cone reach 2x5 reps across body on firm surface    Other Seated Exercises Comments  seated on dyna disc with LAQ 3sec hold x10         PT Education - 10/28/18 1522    Education Details  technique with therex; upcoming re-evaluation    Person(s) Educated  Patient    Methods  Explanation    Comprehension  Verbalized understanding       PT Short Term Goals - 10/04/18 1634      PT SHORT TERM GOAL #1   Title  be independent in initial HEP    Time  4    Period  Weeks    Status  Achieved  Target Date  10/12/18      PT SHORT TERM GOAL #2   Title  Pt will be able to ambulate with improved foot clearance and step length to decrease risk of falling throughout the day.    Baseline  walking has improved, but still issues with negotiating obstacles/steps    Time  4    Period  Weeks    Status  Partially Met        PT Long Term Goals - 10/07/18 1620      PT LONG TERM GOAL #1   Title  Pt will demo improved LE strength to atleast 4/5 MMT which will improve her mechanics with daily activity.     Time  8    Period  Weeks    Status  New      PT LONG TERM GOAL #2   Title  Pt will demonstrate atleast ft increase in her 6 MWT distance, to reflect improvements in her endurance.     Time  8    Period  Weeks    Status  New      PT LONG TERM GOAL #3   Title  Pt will demo atleast 6 point increase in her BERG balance score to reflect improvements in balance and decrease risk of falls.     Baseline  6 points    Time  8    Period  Weeks    Status  On-going      PT LONG TERM GOAL #4   Title  Pt will be able to carry up to 10 lb object around the clinic  x14f without noted unsteadiness or LOB.     Time  8    Period  Weeks    Status  New      PT LONG TERM GOAL #5   Title  Pt will be able to get up from the floor safely and without the need for cuing from PT, 2/3 trials, to allow her to get up from the ground after gardening.     Time  8    Period  Weeks    Status  New            Plan - 10/28/18 1522    Clinical Impression Statement  Pt is making steady trunk strength improvements, noting that she was able to sit and play her piano while sitting on her bench without limitation. Focused on further improving trunk strength outside base of support, and she did have some difficulty maintaining forward trunk lean during rotation. She was able to maintain partial tandem for greater than 20 sec during today's attempt. No updates were made to her HEP as she notes this is still challenging. Will continue with current POC.    Rehab Potential  Good    PT Frequency  2x / week    PT Duration  8 weeks    PT Treatment/Interventions  ADLs/Self Care Home Management;Moist Heat;Gait training;Stair training;Functional mobility training;Therapeutic activities;Therapeutic exercise;Neuromuscular re-education;Balance training;Patient/family education;Manual techniques;Passive range of motion;Dry needling;Taping    PT Next Visit Plan  BERG; increase leg press weight; single leg balance and balance activity progression    PT Home Exercise Plan  Access Code: [email or web address]     Consulted and Agree with Plan of Care  Patient       Patient will benefit from skilled therapeutic intervention in order to improve the following deficits and impairments:  Abnormal gait, Decreased mobility, Decreased endurance, Decreased strength, Improper body mechanics, Pain, Postural  dysfunction, Impaired flexibility, Increased muscle spasms, Difficulty walking  Visit Diagnosis: 1. Unsteadiness on feet   2. History of falling   3. Muscle weakness (generalized)   4. Other disturbances of  skin sensation        Problem List There are no active problems to display for this patient.   3:28 PM,10/28/18 Sherol Dade PT, [place] at [place]  [facility] [address] [place], [place], [address] Phone: [phone]   Fax:  [phone]  Name: Jacqueline Nash MRN: [id number] Date of Birth: Sep 27, 1940

## 2018-11-01 ENCOUNTER — Other Ambulatory Visit: Payer: Self-pay

## 2018-11-01 ENCOUNTER — Ambulatory Visit: Payer: Medicare Other | Admitting: Physical Therapy

## 2018-11-01 DIAGNOSIS — M6281 Muscle weakness (generalized): Secondary | ICD-10-CM

## 2018-11-01 DIAGNOSIS — Z9181 History of falling: Secondary | ICD-10-CM

## 2018-11-01 DIAGNOSIS — R2681 Unsteadiness on feet: Secondary | ICD-10-CM | POA: Diagnosis not present

## 2018-11-01 NOTE — Therapy (Signed)
[facility] [address] [place], [place], [address] Phone: [phone]   Fax:  [phone]  Physical Therapy Treatment  Patient Details  Name: Jacqueline Nash MRN: [id number] Date of Birth: 09-09-1940 Referring Provider (PT): Marlou Porch, MD    Encounter Date: 11/01/2018  PT End of Session - 11/01/18 1446    Visit Number  10    Date for PT Re-Evaluation  11/09/18    Authorization Type  Medicare A and B     Authorization Time Period  09/14/18 to 11/09/18    Authorization - Visit Number  9    Authorization - Number of Visits  10    PT Start Time  8110    PT Stop Time  3159    PT Time Calculation (min)  48 min    Activity Tolerance  No increased pain;Patient tolerated treatment well    Behavior During Therapy  [facility] for tasks assessed/performed       Past Medical History:  Diagnosis Date  . Cancer ([place])   . Depression   . Hyperlipidemia   . Hypothyroid   . Insomnia   . Migraine   . Osteoporosis   . Peripheral neuropathy   . Scoliosis   . Thyroid cancer ([place]) 1970  . White coat hypertension     No past surgical history on file.  There were no vitals filed for this visit.  Subjective Assessment - 11/01/18 1358    Subjective  Pt says things are going well. She has been working on her exercises.    Pertinent History  osteoporosis; scoliosis    Limitations  Standing;Walking    Patient Stated Goals  improve balance and stamina    Currently in Pain?  No/denies         [facility] PT Assessment - 11/01/18 0001      Berg Balance Test   Sit to Stand  Able to stand without using hands and stabilize independently    Standing Unsupported  Able to stand safely 2 minutes    Sitting with Back Unsupported but Feet Supported on Floor or Stool  Able to sit safely and securely 2 minutes    Stand to Sit  Sits safely with minimal use of hands    Transfers  Able to transfer safely, minor use of hands    Standing  Unsupported with Eyes Closed  Able to stand 10 seconds safely    Standing Unsupported with Feet Together  Able to place feet together independently and stand 1 minute safely    From Standing, Reach Forward with Outstretched Arm  Can reach confidently >25 cm (10")    From Standing Position, Pick up Object from Floor  Able to pick up shoe safely and easily    From Standing Position, Turn to Look Behind Over each Shoulder  Looks behind from both sides and weight shifts well    Turn 360 Degrees  Able to turn 360 degrees safely but slowly   6 sec both directions   Standing Unsupported, Alternately Place Feet on Step/Stool  Able to stand independently and safely and complete 8 steps in 20 seconds    Standing Unsupported, One Foot in Front  Able to plae foot ahead of the other independently and hold 30 seconds   Rt: 30 sec, Lt: 15 sec    Standing on One Leg  Tries to lift leg/unable to hold 3 seconds but remains standing independently   Rt: 4 sec, Lt 2 sec  Total Score  50                   [facility] Adult PT Treatment/Exercise - 11/01/18 0001      Knee/Hip Exercises: Aerobic   Nustep  L3 x6 min, PT present to discuss HEP      Knee/Hip Exercises: Machines for Strengthening   Total Gym Leg Press  seat 7, incline 3 single leg #50 2x20      Knee/Hip Exercises: Standing   SLS  With LE propped on step, 3-5 sec hold off step x2 reps     Other Standing Knee Exercises  Rt hip hike with Rt LE on step 10x3 sec hold              PT Education - 11/01/18 1446    Education Details  updated to HEP; results of BERG balance test    Person(s) Educated  Patient    Methods  Explanation;Handout;Verbal cues    Comprehension  Verbalized understanding;Returned demonstration       PT Short Term Goals - 10/04/18 1634      PT SHORT TERM GOAL #1   Title  be independent in initial HEP    Time  4    Period  Weeks    Status  Achieved    Target Date  10/12/18      PT SHORT TERM GOAL #2   Title   Pt will be able to ambulate with improved foot clearance and step length to decrease risk of falling throughout the day.    Baseline  walking has improved, but still issues with negotiating obstacles/steps    Time  4    Period  Weeks    Status  Partially Met        PT Long Term Goals - 10/07/18 1620      PT LONG TERM GOAL #1   Title  Pt will demo improved LE strength to atleast 4/5 MMT which will improve her mechanics with daily activity.     Time  8    Period  Weeks    Status  New      PT LONG TERM GOAL #2   Title  Pt will demonstrate atleast ft increase in her 6 MWT distance, to reflect improvements in her endurance.     Time  8    Period  Weeks    Status  New      PT LONG TERM GOAL #3   Title  Pt will demo atleast 6 point increase in her BERG balance score to reflect improvements in balance and decrease risk of falls.     Baseline  6 points    Time  8    Period  Weeks    Status  On-going      PT LONG TERM GOAL #4   Title  Pt will be able to carry up to 10 lb object around the clinic x183f without noted unsteadiness or LOB.     Time  8    Period  Weeks    Status  New      PT LONG TERM GOAL #5   Title  Pt will be able to get up from the floor safely and without the need for cuing from PT, 2/3 trials, to allow her to get up from the ground after gardening.     Time  8    Period  Weeks    Status  New  Plan - 11/01/18 1447    Clinical Impression Statement  Pt is making improvements in her balance, with atleast 7 points increase in her BERG score since her most recent re-evaluation. She continues to struggle with single leg balance, but tandem stance has greatly improved. Focused on progressing LE strength with leg press progressions and pt had minimal difficulty with this. She was challenged with attempts at hip hike on the Rt secondary to glute medius weakness on the Lt. Her HEP was updated to further address this. She would continue to benefit from  skilled PT to promote LE strength, proprioception and endurance moving forward.    Rehab Potential  Good    PT Frequency  2x / week    PT Duration  8 weeks    PT Treatment/Interventions  ADLs/Self Care Home Management;Moist Heat;Gait training;Stair training;Functional mobility training;Therapeutic activities;Therapeutic exercise;Neuromuscular re-education;Balance training;Patient/family education;Manual techniques;Passive range of motion;Dry needling;Taping    PT Next Visit Plan  re-eval; increase leg press weight to 60; update HEP; single leg balance and balance activity progression    PT Home Exercise Plan  Access Code: [email or web address]     Consulted and Agree with Plan of Care  Patient       Patient will benefit from skilled therapeutic intervention in order to improve the following deficits and impairments:  Abnormal gait, Decreased mobility, Decreased endurance, Decreased strength, Improper body mechanics, Pain, Postural dysfunction, Impaired flexibility, Increased muscle spasms, Difficulty walking  Visit Diagnosis: 1. Unsteadiness on feet   2. History of falling   3. Muscle weakness (generalized)        Problem List There are no active problems to display for this patient.   2:52 PM,11/01/18 [place], DPT [place] [facility] [place]  [place] [address] [place], [place], [address] Phone: [phone]   Fax:  [phone]  Name: Jacqueline Nash MRN: [id number] Date of Birth: Aug 26, 1940

## 2018-11-01 NOTE — Patient Instructions (Signed)
Access Code: [email or web address]  URL: [email or web address]  Date: 11/01/2018  Prepared by: Sherol Dade   Exercises  Supine Bridge with Resistance Band - 10 reps - 2x daily - 7x weekly  Clamshell with Resistance - 10 reps - 1 sets - 2x daily - 7x weekly  Standing Anti-Rotation Press with Anchored Resistance - 15 reps - 2 sets - 2x daily - 7x weekly  Sit to Stand with Arms Crossed - 5-10 reps - 2x daily - 7x weekly  Supine Hip Extension with Towel Roll (BKA) - 10 reps - 3 sets - 5 hold - 1x daily - 7x weekly  Side Stepping with Resistance at Feet - 10 reps - 2 sets - 1x daily - 7x weekly  Seated Shoulder Row with Anchored Resistance - 15 reps - 2 sets - 1x daily - 7x weekly  Standing Hip Hiking - 10 reps - 3-5 hold - 1x daily - 7x weekly    [facility] [address] [place], [place] [address] Phone # [phone] Fax [phone]

## 2018-11-04 ENCOUNTER — Other Ambulatory Visit: Payer: Self-pay

## 2018-11-04 ENCOUNTER — Ambulatory Visit: Payer: Medicare Other | Admitting: Physical Therapy

## 2018-11-04 ENCOUNTER — Encounter: Payer: Self-pay | Admitting: Physical Therapy

## 2018-11-04 DIAGNOSIS — R2681 Unsteadiness on feet: Secondary | ICD-10-CM | POA: Diagnosis not present

## 2018-11-04 DIAGNOSIS — Z9181 History of falling: Secondary | ICD-10-CM

## 2018-11-04 DIAGNOSIS — M6281 Muscle weakness (generalized): Secondary | ICD-10-CM

## 2018-11-04 NOTE — Patient Instructions (Signed)
Access Code: [email or web address]  URL: [email or web address]  Date: 11/04/2018  Prepared by: Sherol Dade   Exercises  Supine Bridge with Resistance Band - 10 reps - 2x daily - 7x weekly  Clamshell with Resistance - 10 reps - 1 sets - 2x daily - 7x weekly  Standing Anti-Rotation Press with Anchored Resistance - 15 reps - 2 sets - 2x daily - 7x weekly  Sit to Stand with Arms Crossed - 5-10 reps - 2x daily - 7x weekly  Side Stepping with Resistance at Feet - 10 reps - 2 sets - 1x daily - 7x weekly  Seated Shoulder Row with Anchored Resistance - 15 reps - 2 sets - 1x daily - 7x weekly  Standing Hip Hiking - 10 reps - 3-5 hold - 1x daily - 7x weekly  Long Sitting Ankle Plantar Flexion with Resistance - 20 reps - 3 sets - 1x daily - 7x weekly    [facility] [address], [place], [place] [address] Phone # [phone] Fax [phone]

## 2018-11-04 NOTE — Therapy (Signed)
[facility] [address] [place], [place], [address] Phone: [phone]   Fax:  [phone]  Physical Therapy Treatment/re-eval  Patient Details  Name: Jacqueline Nash MRN: [id number] Date of Birth: 01-14-1941 Referring Provider (PT): Marlou Porch, MD   Progress Note Reporting Period 09/14/18 to 11/04/18  See note below for Objective Data and Assessment of Progress/Goals.      Encounter Date: 11/04/2018  PT End of Session - 11/04/18 1532    Visit Number  11    Date for PT Re-Evaluation  11/09/18    Authorization Type  Medicare A and B     Authorization Time Period  09/14/18 to 11/09/18, NEW: 11/10/18 to 12/23/18    Authorization - Visit Number  10    Authorization - Number of Visits  20    PT Start Time  8264    PT Stop Time  1523    PT Time Calculation (min)  45 min    Activity Tolerance  No increased pain;Patient tolerated treatment well    Behavior During Therapy  [facility] for tasks assessed/performed       Past Medical History:  Diagnosis Date  . Cancer ([place])   . Depression   . Hyperlipidemia   . Hypothyroid   . Insomnia   . Migraine   . Osteoporosis   . Peripheral neuropathy   . Scoliosis   . Thyroid cancer ([place]) 1970  . White coat hypertension     History reviewed. No pertinent surgical history.  There were no vitals filed for this visit.      [facility] PT Assessment - 11/04/18 0001      Assessment   Medical Diagnosis  Gait/balance disorder    Referring Provider (PT)  Marlou Porch, MD     Onset Date/Surgical Date  --   6 months ago   Next MD Visit  not sure     Prior Therapy  PT for back pain 2 years ago       Precautions   Precautions  Fall      Restrictions   Weight Bearing Restrictions  No      [place] residence    Additional Comments  single story home       Prior Function   Level of Independence  Independent with basic ADLs    Leisure   shopping, likes to garden       Cognition   Overall Cognitive Status  Within Functional Limits for tasks assessed      Sensation   Additional Comments  burning sensation bottom of Lt foot       Strength   Right Hip Flexion  4/5    Right Hip Extension  3/5   limited hip extension ROM   Right Hip ABduction  3/5    Left Hip Flexion  4/5    Left Hip Extension  3/5   limited hip extension ROM   Left Hip ABduction  3/5    Right Knee Extension  5/5    Left Knee Extension  5/5    Right Ankle Dorsiflexion  5/5    Right Ankle Plantar Flexion  --   unable to complete single leg heel raise    Left Ankle Dorsiflexion  5/5    Left Ankle Plantar Flexion  --   single leg heel raises x15 reps      Transfers   Five time sit to stand comments  13 sec, good control with descent no UE support       Ambulation/Gait   Gait Comments  decreased step length, flexed posture, using SPC      6 minute walk test results    Endurance additional comments  6 MWT: SPC 320 ft without rest break, using [facility]      Standardized Balance Assessment   Standardized Balance Assessment  Berg Balance Test      Berg Balance Test   Sit to Stand  Able to stand without using hands and stabilize independently    Standing Unsupported  Able to stand safely 2 minutes    Sitting with Back Unsupported but Feet Supported on Floor or Stool  Able to sit safely and securely 2 minutes    Stand to Sit  Sits safely with minimal use of hands    Transfers  Able to transfer safely, minor use of hands    Standing Unsupported with Eyes Closed  Able to stand 10 seconds safely    Standing Unsupported with Feet Together  Able to place feet together independently and stand 1 minute safely    From Standing, Reach Forward with Outstretched Arm  Can reach confidently >25 cm (10")    From Standing Position, Pick up Object from Floor  Able to pick up shoe safely and easily    From Standing Position, Turn to Look Behind Over each Shoulder  Looks  behind from both sides and weight shifts well    Turn 360 Degrees  Able to turn 360 degrees safely but slowly   6 sec both directions   Standing Unsupported, Alternately Place Feet on Step/Stool  Able to stand independently and safely and complete 8 steps in 20 seconds    Standing Unsupported, One Foot in Front  Able to plae foot ahead of the other independently and hold 30 seconds   Rt 30 sec, Lt 15 sec    Standing on One Leg  Tries to lift leg/unable to hold 3 seconds but remains standing independently   Rt 4 sec, Lt 2 sec    Total Score  50                   [facility] Adult PT Treatment/Exercise - 11/04/18 0001      Exercises   Exercises  Ankle      Knee/Hip Exercises: Sidelying   Clams  green TB Rt only x10 reps       Ankle Exercises: Seated   Other Seated Ankle Exercises  Rt plantar flexion with red TB x15 reps, increased to blue TB x15 reps              PT Education - 11/04/18 1543    Education Details  discussed goals moving forward; updated and reviewed HEP    Person(s) Educated  Patient    Methods  Explanation;Handout    Comprehension  Verbalized understanding       PT Short Term Goals - 11/04/18 1504      PT SHORT TERM GOAL #1   Title  be independent in initial HEP    Time  4    Period  Weeks    Status  Achieved    Target Date  10/12/18      PT SHORT TERM GOAL #2   Title  Pt will be able to ambulate with improved foot clearance and step length to decrease risk of falling throughout the day.    Baseline  pt has removed throw  rugs and feels that she is less likely to fall with this, still noting decreased Rt foot clearance    Time  4    Period  Weeks    Status  Partially Met        PT Long Term Goals - 11/04/18 1507      PT LONG TERM GOAL #1   Title  Pt will demo improved LE strength to atleast 4/5 MMT which will improve her mechanics with daily activity.     Baseline  progress towards this    Time  8    Period  Weeks    Status   Partially Met      PT LONG TERM GOAL #2   Title  Pt will demonstrate atleast 177f increase in her 6 MWT distance, to reflect improvements in her endurance.    Time  8    Period  Weeks    Status  On-going      PT LONG TERM GOAL #3   Title  Pt will demo atleast 6 point increase in her BERG balance score to reflect improvements in balance and decrease risk of falls.     Baseline  6 points    Time  8    Period  Weeks    Status  On-going      PT LONG TERM GOAL #4   Title  Pt will be able to carry up to 10 lb object around the clinic x1082fwithout noted unsteadiness or LOB.     Time  8    Period  Weeks    Status  New      PT LONG TERM GOAL #5   Title  Pt will be able to get up from the floor safely and without the need for cuing from PT, 2/3 trials, to allow her to get up from the ground after gardening.     Baseline  pt able to do this with UE support through coffee table    Time  8    Period  Weeks    Status  Achieved      PT LONG TERM GOAL #6   Title  Pt will report atleast 40% improvement in her ability to work in her garden from the start of PT.    Time  6    Period  Weeks    Status  New    Target Date  12/23/18            Plan - 11/04/18 1534    Clinical Impression Statement  Pt has made good progress towards her goals since starting PT several weeks ago. She demonstrated a significant 7 point improvement in her BERG balance test and feels that she has made good adjustments to her home environment to decrease her risk of falling. Pt also demonstrates a muscle grade of improvement in hip abductor and flexion strength. Her Lt gastroc strength has also improved, however her Rt gastroc remains limited. Pt was able to complete 5x sit to stand with good eccentric control and without the need for UE support. Pt does continue to ambulate slowly and her endurance is limited, evident by lack of improvement on the 6 minute walk test. She would continue to benefit from skilled PT  moving forward in order to address her remaining limitations in LE strength, trunk strength/endurance and proprioception in order to increase her independence around the house and decrease her risk of falling.    Rehab Potential  Good  PT Frequency  2x / week    PT Duration  6 weeks    PT Treatment/Interventions  ADLs/Self Care Home Management;Moist Heat;Gait training;Stair training;Functional mobility training;Therapeutic activities;Therapeutic exercise;Neuromuscular re-education;Balance training;Patient/family education;Manual techniques;Passive range of motion;Dry needling;Taping    PT Next Visit Plan  f/u on gastroc strength; standing rows for HEP; kneeling or half kneeling balance for trunk control; walking for warmup; balance activity progression    PT Home Exercise Plan  Access Code: [email or web address]     Consulted and Agree with Plan of Care  Patient       Patient will benefit from skilled therapeutic intervention in order to improve the following deficits and impairments:  Abnormal gait, Decreased mobility, Decreased endurance, Decreased strength, Improper body mechanics, Pain, Postural dysfunction, Impaired flexibility, Increased muscle spasms, Difficulty walking  Visit Diagnosis: 1. Unsteadiness on feet   2. History of falling   3. Muscle weakness (generalized)        Problem List There are no active problems to display for this patient.   3:45 PM,11/04/18 [place], [place] [facility] [place]  [facility] [address] [place], [place], [address] Phone: [phone]   Fax:  [phone]  Name: Jacqueline Nash MRN: [id number] Date of Birth: 04-Jan-1941

## 2018-11-15 ENCOUNTER — Encounter: Payer: Medicare Other | Admitting: Physical Therapy

## 2018-11-16 ENCOUNTER — Encounter: Payer: Self-pay | Admitting: Physical Therapy

## 2018-11-16 ENCOUNTER — Ambulatory Visit: Payer: Medicare Other | Attending: Family Medicine | Admitting: Physical Therapy

## 2018-11-16 ENCOUNTER — Other Ambulatory Visit: Payer: Self-pay

## 2018-11-16 DIAGNOSIS — M6281 Muscle weakness (generalized): Secondary | ICD-10-CM

## 2018-11-16 DIAGNOSIS — R278 Other lack of coordination: Secondary | ICD-10-CM | POA: Diagnosis present

## 2018-11-16 DIAGNOSIS — R208 Other disturbances of skin sensation: Secondary | ICD-10-CM | POA: Diagnosis present

## 2018-11-16 DIAGNOSIS — Z9181 History of falling: Secondary | ICD-10-CM

## 2018-11-16 DIAGNOSIS — R2681 Unsteadiness on feet: Secondary | ICD-10-CM | POA: Diagnosis present

## 2018-11-16 NOTE — Therapy (Signed)
[facility] [address], [place], [place], [address] Phone: [phone]   Fax:  [phone]  Physical Therapy Treatment  Patient Details  Name: Jacqueline Nash MRN: [id number] Date of Birth: 1940-10-27 Referring Provider (PT): Marlou Porch, MD    Encounter Date: 11/16/2018  PT End of Session - 11/16/18 1424    Visit Number  12    Date for PT Re-Evaluation  11/09/18    Authorization Type  Medicare A and B     Authorization Time Period  09/14/18 to 11/09/18, NEW: 11/10/18 to 12/23/18    Authorization - Visit Number  11    Authorization - Number of Visits  20    PT Start Time  3875    PT Stop Time  1500    PT Time Calculation (min)  42 min    Activity Tolerance  Patient tolerated treatment well    Behavior During Therapy  [facility] for tasks assessed/performed       Past Medical History:  Diagnosis Date  . Cancer ([place])   . Depression   . Hyperlipidemia   . Hypothyroid   . Insomnia   . Migraine   . Osteoporosis   . Peripheral neuropathy   . Scoliosis   . Thyroid cancer ([place]) 1970  . White coat hypertension     History reviewed. No pertinent surgical history.  There were no vitals filed for this visit.  Subjective Assessment - 11/16/18 1423    Subjective  Pt reporting today is a good day.    Limitations  Standing;Walking    Patient Stated Goals  improve balance and stamina    Currently in Pain?  No/denies         [facility] PT Assessment - 11/16/18 0001      Assessment   Medical Diagnosis  Gait/balance disorder    Referring Provider (PT)  Marlou Porch, MD     Next MD Visit  not sure     Prior Therapy  PT for back pain 2 years ago       Precautions   Precautions  Fall      Restrictions   Weight Bearing Restrictions  No      [place] residence      Sensation   Additional Comments  burning sensation bottom of Lt foot                    [facility]  Adult PT Treatment/Exercise - 11/16/18 0001      Knee/Hip Exercises: Aerobic   Stepper  L3 x 5 minutes      Knee/Hip Exercises: Standing   Knee Flexion  AROM;Strengthening;Both;2 sets;10 reps    Knee Flexion Limitations  sinlge UE support    Hip ADduction  Strengthening;Both;3 sets;10 reps    Hip ADduction Limitations  single UE support    Other Standing Knee Exercises  heel raises x 20, toe raises x 20 with UE support    Other Standing Knee Exercises  amb on sidewalk in front of clinic with quad tip straight cane with SBA and verbal cues for postural correction. As pt progressed with amb she began to increase her forward turnk flexion and needed tactile and verbal cues to stop and correct her posture. As pt was leaning forwrard her gait velocity increased increasing her fall risk. Pt was edu on proper gait sequencing and postural correction.   600 feet     Knee/Hip Exercises:  Seated   Long Arc Quad  Strengthening;Both;2 sets;10 reps          Balance Exercises - 11/16/18 1504      Balance Exercises: Seated   Other Seated Exercises  sitting on red therapy ball, (boundcing, marching, LAQ) all with CGA intermittently to stabalize ball, pt requring constant cues to correct posterior lean while sitting.           PT Short Term Goals - 11/04/18 1504      PT SHORT TERM GOAL #1   Title  be independent in initial HEP    Time  4    Period  Weeks    Status  Achieved    Target Date  10/12/18      PT SHORT TERM GOAL #2   Title  Pt will be able to ambulate with improved foot clearance and step length to decrease risk of falling throughout the day.    Baseline  pt has removed throw rugs and feels that she is less likely to fall with this, still noting decreased Rt foot clearance    Time  4    Period  Weeks    Status  Partially Met        PT Long Term Goals - 11/04/18 1507      PT LONG TERM GOAL #1   Title  Pt will demo improved LE strength to atleast 4/5 MMT which will improve  her mechanics with daily activity.     Baseline  progress towards this    Time  8    Period  Weeks    Status  Partially Met      PT LONG TERM GOAL #2   Title  Pt will demonstrate atleast 145f increase in her 6 MWT distance, to reflect improvements in her endurance.    Time  8    Period  Weeks    Status  On-going      PT LONG TERM GOAL #3   Title  Pt will demo atleast 6 point increase in her BERG balance score to reflect improvements in balance and decrease risk of falls.     Baseline  6 points    Time  8    Period  Weeks    Status  On-going      PT LONG TERM GOAL #4   Title  Pt will be able to carry up to 10 lb object around the clinic x1072fwithout noted unsteadiness or LOB.     Time  8    Period  Weeks    Status  New      PT LONG TERM GOAL #5   Title  Pt will be able to get up from the floor safely and without the need for cuing from PT, 2/3 trials, to allow her to get up from the ground after gardening.     Baseline  pt able to do this with UE support through coffee table    Time  8    Period  Weeks    Status  Achieved      PT LONG TERM GOAL #6   Title  Pt will report atleast 40% improvement in her ability to work in her garden from the start of PT.    Time  6    Period  Weeks    Status  New    Target Date  12/23/18            Plan - 11/16/18 1425  Clinical Impression Statement  Pt arriving to therapy tolerating exercises well, no increase in pain reported during session. Pt warmed up today with amb on sidewalk in front of clnic with Standby assistance. pt with varying levels of gait velocity. Pt with forward flexed posture and gait progression. Pt requiring verbal cues to straighten her posture, and slow her gait for safety. Pt amb today with quad tip straight.cane. Continue to progress daynamic balance and LE strengthening as well as posture.    Rehab Potential  Good    PT Frequency  2x / week    PT Duration  6 weeks    PT Treatment/Interventions  ADLs/Self  Care Home Management;Moist Heat;Gait training;Stair training;Functional mobility training;Therapeutic activities;Therapeutic exercise;Neuromuscular re-education;Balance training;Patient/family education;Manual techniques;Passive range of motion;Dry needling;Taping    PT Next Visit Plan  f/u on gastroc strength; standing rows for HEP; kneeling or half kneeling balance for trunk control; walking for warmup; balance activity progression    PT Home Exercise Plan  Access Code: [email or web address]     Consulted and Agree with Plan of Care  Patient       Patient will benefit from skilled therapeutic intervention in order to improve the following deficits and impairments:  Abnormal gait, Decreased mobility, Decreased endurance, Decreased strength, Improper body mechanics, Pain, Postural dysfunction, Impaired flexibility, Increased muscle spasms, Difficulty walking  Visit Diagnosis: 1. History of falling   2. Unsteadiness on feet   3. Muscle weakness (generalized)   4. Other disturbances of skin sensation   5. Other lack of coordination        Problem List There are no active problems to display for this patient.   Oretha Caprice, PT 11/16/2018, 3:14 PM  [facility] [address], [place], [place], [address] Phone: [phone]   Fax:  [phone]  Name: Adamaris King MRN: [id number] Date of Birth: [birth year]

## 2018-11-17 ENCOUNTER — Ambulatory Visit: Payer: Medicare Other | Admitting: Physical Therapy

## 2018-11-17 ENCOUNTER — Other Ambulatory Visit: Payer: Self-pay

## 2018-11-17 ENCOUNTER — Encounter: Payer: Self-pay | Admitting: Physical Therapy

## 2018-11-17 DIAGNOSIS — R208 Other disturbances of skin sensation: Secondary | ICD-10-CM

## 2018-11-17 DIAGNOSIS — Z9181 History of falling: Secondary | ICD-10-CM | POA: Diagnosis not present

## 2018-11-17 DIAGNOSIS — M6281 Muscle weakness (generalized): Secondary | ICD-10-CM

## 2018-11-17 DIAGNOSIS — R2681 Unsteadiness on feet: Secondary | ICD-10-CM

## 2018-11-17 NOTE — Therapy (Signed)
[facility] [address], [place], [place], [address] Phone: [phone]   Fax:  [phone]  Physical Therapy Treatment  Patient Details  Name: Dellamae Rosamilia MRN: [id number] Date of Birth: 08-12-1940 Referring Provider (PT): Marlou Porch, MD    Encounter Date: 11/17/2018  PT End of Session - 11/17/18 1459    Visit Number  13    Date for PT Re-Evaluation  11/09/18    Authorization Type  Medicare A and B     Authorization Time Period  09/14/18 to 11/09/18, NEW: 11/10/18 to 12/23/18    Authorization - Visit Number  13    Authorization - Number of Visits  20    PT Start Time  9166    PT Stop Time  0600    PT Time Calculation (min)  43 min    Activity Tolerance  Patient tolerated treatment well;No increased pain    Behavior During Therapy  WFL for tasks assessed/performed       Past Medical History:  Diagnosis Date  . Cancer ([place])   . Depression   . Hyperlipidemia   . Hypothyroid   . Insomnia   . Migraine   . Osteoporosis   . Peripheral neuropathy   . Scoliosis   . Thyroid cancer ([place]) 1970  . White coat hypertension     History reviewed. No pertinent surgical history.  There were no vitals filed for this visit.  Subjective Assessment - 11/17/18 1419    Subjective  Pt states that she is sore and stiff from the new exercises she did yesterday. No other complaints.    Limitations  Standing;Walking    Patient Stated Goals  improve balance and stamina    Currently in Pain?  No/denies                       [facility] Adult PT Treatment/Exercise - 11/17/18 0001      Knee/Hip Exercises: Aerobic   Stepper  L x6 min, PT present to discuss       Knee/Hip Exercises: Standing   Knee Flexion  Strengthening;Both;2 sets;15 reps    Knee Flexion Limitations  2# ankle weights     Hip Extension  Stengthening;Both;1 set;15 reps    Extension Limitations  forearms resting on countertop       Knee/Hip  Exercises: Seated   Other Seated Knee/Hip Exercises  Seated on dyna disc: hip flexion x10 reps, LAQ x10 reps without UE support for trunk control      Ankle Exercises: Seated   Other Seated Ankle Exercises  Lt and Rt ankle DF with yellow TB x20 reps; Lt and Rt ankle active inversion/eversion x15 reps              PT Education - 11/17/18 1459    Education Details  technique with therex    Person(s) Educated  Patient    Methods  Explanation;Verbal cues    Comprehension  Verbalized understanding;Returned demonstration       PT Short Term Goals - 11/04/18 1504      PT SHORT TERM GOAL #1   Title  be independent in initial HEP    Time  4    Period  Weeks    Status  Achieved    Target Date  10/12/18      PT SHORT TERM GOAL #2   Title  Pt will be able to ambulate with improved foot clearance and step length to decrease  risk of falling throughout the day.    Baseline  pt has removed throw rugs and feels that she is less likely to fall with this, still noting decreased Rt foot clearance    Time  4    Period  Weeks    Status  Partially Met        PT Long Term Goals - 11/04/18 1507      PT LONG TERM GOAL #1   Title  Pt will demo improved LE strength to atleast 4/5 MMT which will improve her mechanics with daily activity.     Baseline  progress towards this    Time  8    Period  Weeks    Status  Partially Met      PT LONG TERM GOAL #2   Title  Pt will demonstrate atleast 169f increase in her 6 MWT distance, to reflect improvements in her endurance.    Time  8    Period  Weeks    Status  On-going      PT LONG TERM GOAL #3   Title  Pt will demo atleast 6 point increase in her BERG balance score to reflect improvements in balance and decrease risk of falls.     Baseline  6 points    Time  8    Period  Weeks    Status  On-going      PT LONG TERM GOAL #4   Title  Pt will be able to carry up to 10 lb object around the clinic x1063fwithout noted unsteadiness or LOB.      Time  8    Period  Weeks    Status  New      PT LONG TERM GOAL #5   Title  Pt will be able to get up from the floor safely and without the need for cuing from PT, 2/3 trials, to allow her to get up from the ground after gardening.     Baseline  pt able to do this with UE support through coffee table    Time  8    Period  Weeks    Status  Achieved      PT LONG TERM GOAL #6   Title  Pt will report atleast 40% improvement in her ability to work in her garden from the start of PT.    Time  6    Period  Weeks    Status  New    Target Date  12/23/18            Plan - 11/17/18 1500    Clinical Impression Statement  Pt had some trunk soreness in her trunk following yesterday's new exercises. Continued with trunk stability in seated today and introduced resistance with ankle dorsiflexion. Pt was able to do this without difficulty, but she required tactile cuing and assistance with active ankle inversion/eversion. Pt reported improvements in trunk stiffness end of session. Will continue with current POC.    Rehab Potential  Good    PT Frequency  2x / week    PT Duration  6 weeks    PT Treatment/Interventions  ADLs/Self Care Home Management;Moist Heat;Gait training;Stair training;Functional mobility training;Therapeutic activities;Therapeutic exercise;Neuromuscular re-education;Balance training;Patient/family education;Manual techniques;Passive range of motion;Dry needling;Taping    PT Next Visit Plan  update rows resistance; kneeling or half kneeling balance for trunk control; walking for warmup; balance activity progression    PT Home Exercise Plan  Access Code: [email or web address]     Consulted  and Agree with Plan of Care  Patient       Patient will benefit from skilled therapeutic intervention in order to improve the following deficits and impairments:  Abnormal gait, Decreased mobility, Decreased endurance, Decreased strength, Improper body mechanics, Pain, Postural dysfunction, Impaired  flexibility, Increased muscle spasms, Difficulty walking  Visit Diagnosis: 1. Unsteadiness on feet   2. History of falling   3. Muscle weakness (generalized)   4. Other disturbances of skin sensation        Problem List There are no active problems to display for this patient.   3:40 PM,11/17/18 Sherol Dade PT, DPT [place] [facility] [place]  [facility] [address], [place], [place], [address] Phone: [phone]   Fax:  [phone]  Name: Ibtisam Benge MRN: [id number] Date of Birth: 05/27/1940

## 2018-11-22 ENCOUNTER — Ambulatory Visit: Payer: Medicare Other | Admitting: Physical Therapy

## 2018-11-22 ENCOUNTER — Other Ambulatory Visit: Payer: Self-pay

## 2018-11-22 ENCOUNTER — Encounter: Payer: Self-pay | Admitting: Physical Therapy

## 2018-11-22 DIAGNOSIS — Z9181 History of falling: Secondary | ICD-10-CM | POA: Diagnosis not present

## 2018-11-22 DIAGNOSIS — R2681 Unsteadiness on feet: Secondary | ICD-10-CM

## 2018-11-22 DIAGNOSIS — R278 Other lack of coordination: Secondary | ICD-10-CM

## 2018-11-22 DIAGNOSIS — M6281 Muscle weakness (generalized): Secondary | ICD-10-CM

## 2018-11-22 DIAGNOSIS — R208 Other disturbances of skin sensation: Secondary | ICD-10-CM

## 2018-11-22 NOTE — Patient Instructions (Signed)
Access Code: [email or web address]  URL: [email or web address]  Date: 11/22/2018  Prepared by: Sherol Dade   Exercises  Supine Bridge with Resistance Band - 10 reps - 2x daily - 7x weekly  Clamshell with Resistance - 10 reps - 1 sets - 2x daily - 7x weekly  Standing Anti-Rotation Press with Anchored Resistance - 15 reps - 2 sets - 2x daily - 7x weekly  Sit to Stand with Arms Crossed - 5-10 reps - 2x daily - 7x weekly  Side Stepping with Resistance at Feet - 10 reps - 2 sets - 1x daily - 7x weekly  Seated Shoulder Row with Anchored Resistance - 15 reps - 2 sets - 1x daily - 7x weekly  Standing Hip Hiking - 10 reps - 3-5 hold - 1x daily - 7x weekly  Long Sitting Ankle Plantar Flexion with Resistance - 20 reps - 3 sets - 1x daily - 7x weekly  Seated Ankle Inversion Eversion AROM - 10 reps - 2x daily - 7x weekly  CLX Ankle Dorsiflexion and Eversion - 10 reps - 2x daily - 7x weekly    [facility] [address], [place], [place] [address] Phone # [phone] Fax [phone]

## 2018-11-22 NOTE — Therapy (Signed)
[facility] [address] [place], [place], [address] Phone: [phone]   Fax:  [phone]  Physical Therapy Treatment  Patient Details  Name: Jacqueline Nash MRN: [id number] Date of Birth: [birth year] Referring Provider (PT): Marlou Porch, MD    Encounter Date: 11/22/2018  PT End of Session - 11/22/18 1502    Visit Number  14    Date for PT Re-Evaluation  11/09/18    Authorization Type  Medicare A and B     Authorization Time Period  09/14/18 to 11/09/18, NEW: 11/10/18 to 12/23/18    Authorization - Visit Number  13    Authorization - Number of Visits  20    PT Start Time  4132    PT Stop Time  4401    PT Time Calculation (min)  42 min    Activity Tolerance  Patient tolerated treatment well;No increased pain    Behavior During Therapy  WFL for tasks assessed/performed       Past Medical History:  Diagnosis Date  . Cancer ([place])   . Depression   . Hyperlipidemia   . Hypothyroid   . Insomnia   . Migraine   . Osteoporosis   . Peripheral neuropathy   . Scoliosis   . Thyroid cancer ([place]) 1970  . White coat hypertension     History reviewed. No pertinent surgical history.  There were no vitals filed for this visit.  Subjective Assessment - 11/22/18 1416    Subjective  Pt has no complaints at this time.    Limitations  Standing;Walking    Patient Stated Goals  improve balance and stamina    Currently in Pain?  No/denies                       [facility] Adult PT Treatment/Exercise - 11/22/18 0001      Knee/Hip Exercises: Aerobic   Stepper  interval L2/L4 every 60 sec, x7 min       Knee/Hip Exercises: Standing   Walking with Sports Cord  15# Lt and Rt x5 steps 3 sets using SPC    Other Standing Knee Exercises  partial tandem with pallof press red TB pull Rt x5 reps each LE forward     Other Standing Knee Exercises  BUE rows with blue TB x12 reps       Ankle Exercises: Seated   Other  Seated Ankle Exercises  Rt and Lt ankle inversion/eversion x15 reps, yellow TB x10 reps each LE             PT Education - 11/22/18 1502    Education Details  updated and reviewed HEP    Person(s) Educated  Patient    Methods  Explanation;Handout;Verbal cues    Comprehension  Verbalized understanding;Returned demonstration       PT Short Term Goals - 11/04/18 1504      PT SHORT TERM GOAL #1   Title  be independent in initial HEP    Time  4    Period  Weeks    Status  Achieved    Target Date  10/12/18      PT SHORT TERM GOAL #2   Title  Pt will be able to ambulate with improved foot clearance and step length to decrease risk of falling throughout the day.    Baseline  pt has removed throw rugs and feels that she is less likely to fall with this, still noting decreased Rt  foot clearance    Time  4    Period  Weeks    Status  Partially Met        PT Long Term Goals - 11/04/18 1507      PT LONG TERM GOAL #1   Title  Pt will demo improved LE strength to atleast 4/5 MMT which will improve her mechanics with daily activity.     Baseline  progress towards this    Time  8    Period  Weeks    Status  Partially Met      PT LONG TERM GOAL #2   Title  Pt will demonstrate atleast 165f increase in her 6 MWT distance, to reflect improvements in her endurance.    Time  8    Period  Weeks    Status  On-going      PT LONG TERM GOAL #3   Title  Pt will demo atleast 6 point increase in her BERG balance score to reflect improvements in balance and decrease risk of falls.     Baseline  6 points    Time  8    Period  Weeks    Status  On-going      PT LONG TERM GOAL #4   Title  Pt will be able to carry up to 10 lb object around the clinic x1053fwithout noted unsteadiness or LOB.     Time  8    Period  Weeks    Status  New      PT LONG TERM GOAL #5   Title  Pt will be able to get up from the floor safely and without the need for cuing from PT, 2/3 trials, to allow her to get  up from the ground after gardening.     Baseline  pt able to do this with UE support through coffee table    Time  8    Period  Weeks    Status  Achieved      PT LONG TERM GOAL #6   Title  Pt will report atleast 40% improvement in her ability to work in her garden from the start of PT.    Time  6    Period  Weeks    Status  New    Target Date  12/23/18            Plan - 11/22/18 1504    Clinical Impression Statement  Today's session continued with focus on trunk/LE strength progressions. Pt was able to complete active ankle inversion/eversion this session with less cuing than her last session. Introduced yellow TB for resistance and added this to her HEP. Pt had difficulty with resisted sidestepping, requiring CGA from the therapist for safety. Completed Nustep intervals for endurance with some LE fatigue noted. Will continue to work on strength and proprioception progressions moving forward.    Rehab Potential  Good    PT Frequency  2x / week    PT Duration  6 weeks    PT Treatment/Interventions  ADLs/Self Care Home Management;Moist Heat;Gait training;Stair training;Functional mobility training;Therapeutic activities;Therapeutic exercise;Neuromuscular re-education;Balance training;Patient/family education;Manual techniques;Passive range of motion;Dry needling;Taping    PT Next Visit Plan  seated on unstable surface for trunk strength; walking or Nustep interval for warmup; balance activity progression    PT Home Exercise Plan  Access Code: [email or web address]     Consulted and Agree with Plan of Care  Patient       Patient will benefit from skilled  therapeutic intervention in order to improve the following deficits and impairments:  Abnormal gait, Decreased mobility, Decreased endurance, Decreased strength, Improper body mechanics, Pain, Postural dysfunction, Impaired flexibility, Increased muscle spasms, Difficulty walking  Visit Diagnosis: 1. Unsteadiness on feet   2. History of falling    3. Muscle weakness (generalized)   4. Other disturbances of skin sensation   5. Other lack of coordination        Problem List There are no active problems to display for this patient.  3:09 PM,11/22/18 Sherol Dade PT, DPT [place] [facility] [address], [place], [place], [address] Phone: [phone]   Fax:  [phone]  Name: Jacqueline Nash MRN: [id number] Date of Birth: 05/23/1940

## 2018-11-25 ENCOUNTER — Other Ambulatory Visit: Payer: Self-pay

## 2018-11-25 ENCOUNTER — Encounter: Payer: Self-pay | Admitting: Physical Therapy

## 2018-11-25 ENCOUNTER — Ambulatory Visit: Payer: Medicare Other | Admitting: Physical Therapy

## 2018-11-25 DIAGNOSIS — R2681 Unsteadiness on feet: Secondary | ICD-10-CM

## 2018-11-25 DIAGNOSIS — Z9181 History of falling: Secondary | ICD-10-CM

## 2018-11-25 DIAGNOSIS — M6281 Muscle weakness (generalized): Secondary | ICD-10-CM

## 2018-11-25 DIAGNOSIS — R208 Other disturbances of skin sensation: Secondary | ICD-10-CM

## 2018-11-25 NOTE — Therapy (Signed)
[facility] [address], [place], [place], [address] Phone: [phone]   Fax:  [phone]  Physical Therapy Treatment  Patient Details  Name: Jacqueline Nash MRN: [id number] Date of Birth: 06-02-1940 Referring Provider (PT): Marlou Porch, MD    Encounter Date: 11/25/2018  PT End of Session - 11/25/18 1136    Visit Number  15    Date for PT Re-Evaluation  11/09/18    Authorization Type  Medicare A and B     Authorization Time Period  09/14/18 to 11/09/18, NEW: 11/10/18 to 12/23/18    Authorization - Visit Number  14    Authorization - Number of Visits  20    PT Start Time  1100    PT Stop Time  1140    PT Time Calculation (min)  40 min    Activity Tolerance  Patient tolerated treatment well;No increased pain    Behavior During Therapy  WFL for tasks assessed/performed       Past Medical History:  Diagnosis Date  . Cancer ([place])   . Depression   . Hyperlipidemia   . Hypothyroid   . Insomnia   . Migraine   . Osteoporosis   . Peripheral neuropathy   . Scoliosis   . Thyroid cancer ([place]) 1970  . White coat hypertension     History reviewed. No pertinent surgical history.  There were no vitals filed for this visit.  Subjective Assessment - 11/25/18 1151    Subjective  Pt states that her legs were sore after her last session, especially her ankles.    Limitations  Standing;Walking    Patient Stated Goals  improve balance and stamina    Currently in Pain?  No/denies                       [facility] Adult PT Treatment/Exercise - 11/25/18 0001      Knee/Hip Exercises: Aerobic   Nustep  L2 x8 min PT present to discuss post session soreness      Knee/Hip Exercises: Machines for Strengthening   Total Gym Leg Press  seat 7, incline 3: #50 x20 reps each, increased to #60 x20 reps each LE       Knee/Hip Exercises: Standing   Other Standing Knee Exercises  partial tandem with pallof press using  red TB x10 reps each direction with each LE forward (tactile cues intermittently for posture control)      Knee/Hip Exercises: Seated   Sit to Sand  1 set;20 reps;without UE support      Ankle Exercises: Seated   Heel Raises  Both;20 reps   10# dumbbells over knees             PT Education - 11/25/18 1136    Education Details  technique with therex    Person(s) Educated  Patient    Methods  Explanation;Verbal cues;Tactile cues    Comprehension  Verbalized understanding;Returned demonstration       PT Short Term Goals - 11/04/18 1504      PT SHORT TERM GOAL #1   Title  be independent in initial HEP    Time  4    Period  Weeks    Status  Achieved    Target Date  10/12/18      PT SHORT TERM GOAL #2   Title  Pt will be able to ambulate with improved foot clearance and step length to decrease risk of falling throughout  the day.    Baseline  pt has removed throw rugs and feels that she is less likely to fall with this, still noting decreased Rt foot clearance    Time  4    Period  Weeks    Status  Partially Met        PT Long Term Goals - 11/04/18 1507      PT LONG TERM GOAL #1   Title  Pt will demo improved LE strength to atleast 4/5 MMT which will improve her mechanics with daily activity.     Baseline  progress towards this    Time  8    Period  Weeks    Status  Partially Met      PT LONG TERM GOAL #2   Title  Pt will demonstrate atleast 152f increase in her 6 MWT distance, to reflect improvements in her endurance.    Time  8    Period  Weeks    Status  On-going      PT LONG TERM GOAL #3   Title  Pt will demo atleast 6 point increase in her BERG balance score to reflect improvements in balance and decrease risk of falls.     Baseline  6 points    Time  8    Period  Weeks    Status  On-going      PT LONG TERM GOAL #4   Title  Pt will be able to carry up to 10 lb object around the clinic x1060fwithout noted unsteadiness or LOB.     Time  8    Period   Weeks    Status  New      PT LONG TERM GOAL #5   Title  Pt will be able to get up from the floor safely and without the need for cuing from PT, 2/3 trials, to allow her to get up from the ground after gardening.     Baseline  pt able to do this with UE support through coffee table    Time  8    Period  Weeks    Status  Achieved      PT LONG TERM GOAL #6   Title  Pt will report atleast 40% improvement in her ability to work in her garden from the start of PT.    Time  6    Period  Weeks    Status  New    Target Date  12/23/18            Plan - 11/25/18 1137    Clinical Impression Statement  Pt is making steady progress towards her functional goals. She was able to walk around her driveway [facility] and noted no LOB or unsteadiness. Continued with focus on increasing LE endurance and strength as well as trunk strength. Pt demonstrated improved trunk posture during pallof press requiring less frequent cuing with this. Ended session without increase in pain or fatigue.    Rehab Potential  Good    PT Frequency  2x / week    PT Duration  6 weeks    PT Treatment/Interventions  ADLs/Self Care Home Management;Moist Heat;Gait training;Stair training;Functional mobility training;Therapeutic activities;Therapeutic exercise;Neuromuscular re-education;Balance training;Patient/family education;Manual techniques;Passive range of motion;Dry needling;Taping    PT Next Visit Plan  seated on unstable surface for trunk strength; walking or Nustep interval for warmup; balance activity progression; increase leg press resistance    PT Home Exercise Plan  Access Code: [email or web address]  Consulted and Agree with Plan of Care  Patient       Patient will benefit from skilled therapeutic intervention in order to improve the following deficits and impairments:  Abnormal gait, Decreased mobility, Decreased endurance, Decreased strength, Improper body mechanics, Pain, Postural dysfunction, Impaired  flexibility, Increased muscle spasms, Difficulty walking  Visit Diagnosis: 1. Unsteadiness on feet   2. History of falling   3. Muscle weakness (generalized)   4. Other disturbances of skin sensation        Problem List There are no active problems to display for this patient.   11:52 AM,11/25/18 Sherol Dade PT, DPT [facility] [address] [place], [place], [address] Phone: [phone]   Fax:  [phone]  Name: Jacqueline Nash MRN: [id number] Date of Birth: [birth year]

## 2018-11-29 ENCOUNTER — Ambulatory Visit: Payer: Medicare Other | Admitting: Physical Therapy

## 2018-11-29 ENCOUNTER — Encounter: Payer: Self-pay | Admitting: Physical Therapy

## 2018-11-29 ENCOUNTER — Other Ambulatory Visit: Payer: Self-pay

## 2018-11-29 DIAGNOSIS — Z9181 History of falling: Secondary | ICD-10-CM

## 2018-11-29 DIAGNOSIS — M6281 Muscle weakness (generalized): Secondary | ICD-10-CM

## 2018-11-29 DIAGNOSIS — R208 Other disturbances of skin sensation: Secondary | ICD-10-CM

## 2018-11-29 DIAGNOSIS — R278 Other lack of coordination: Secondary | ICD-10-CM

## 2018-11-29 DIAGNOSIS — R2681 Unsteadiness on feet: Secondary | ICD-10-CM

## 2018-11-29 NOTE — Therapy (Signed)
[facility] [address] [place], [place], [address] Phone: [phone]   Fax:  [phone]  Physical Therapy Treatment  Patient Details  Name: Jacqueline Nash MRN: [id number] Date of Birth: Jan 15, 1941 Referring Provider (PT): Marlou Porch, MD    Encounter Date: 11/29/2018  PT End of Session - 11/29/18 1455    Visit Number  16    Date for PT Re-Evaluation  11/09/18    Authorization Type  Medicare A and B     Authorization Time Period  09/14/18 to 11/09/18, NEW: 11/10/18 to 12/23/18    Authorization - Visit Number  15    Authorization - Number of Visits  20    PT Start Time  2330    PT Stop Time  1455    PT Time Calculation (min)  38 min    Activity Tolerance  Patient tolerated treatment well;No increased pain    Behavior During Therapy  WFL for tasks assessed/performed       Past Medical History:  Diagnosis Date  . Cancer ([place])   . Depression   . Hyperlipidemia   . Hypothyroid   . Insomnia   . Migraine   . Osteoporosis   . Peripheral neuropathy   . Scoliosis   . Thyroid cancer ([place]) 1970  . White coat hypertension     History reviewed. No pertinent surgical history.  There were no vitals filed for this visit.  Subjective Assessment - 11/29/18 1420    Subjective  Pt states that she is doing well. She had no issues with her HEP over the weekend.    Limitations  Standing;Walking    Patient Stated Goals  improve balance and stamina    Currently in Pain?  No/denies                       [facility] Adult PT Treatment/Exercise - 11/29/18 0001      Knee/Hip Exercises: Aerobic   Nustep  L4 decreased 1 level every 2 min, 6 min total       Knee/Hip Exercises: Machines for Strengthening   Total Gym Leg Press  seat 7, incline 3 #70 single leg x10 reps each; 2nd set seat moved to 6 x10 reps each       Knee/Hip Exercises: Standing   Other Standing Knee Exercises  walking heel to toe with SPC  and CGA x20 ft pt requiring initial cuing for posture      Knee/Hip Exercises: Seated   Other Seated Knee/Hip Exercises  seated on dyna disc weight shifting with lateral trunk flexion x10 reps each    Other Seated Knee/Hip Exercises  seated on dyna disc with pallof press using green TB 2x10 reps      Ankle Exercises: Seated   Heel Raises  Both    Other Seated Ankle Exercises  heel raises with 10# at knees, completed to fatigue             PT Education - 11/29/18 1455    Education Details  technique with therex    Person(s) Educated  Patient    Methods  Explanation;Verbal cues    Comprehension  Verbalized understanding;Returned demonstration       PT Short Term Goals - 11/04/18 1504      PT SHORT TERM GOAL #1   Title  be independent in initial HEP    Time  4    Period  Weeks    Status  Achieved  Target Date  10/12/18      PT SHORT TERM GOAL #2   Title  Pt will be able to ambulate with improved foot clearance and step length to decrease risk of falling throughout the day.    Baseline  pt has removed throw rugs and feels that she is less likely to fall with this, still noting decreased Rt foot clearance    Time  4    Period  Weeks    Status  Partially Met        PT Long Term Goals - 11/04/18 1507      PT LONG TERM GOAL #1   Title  Pt will demo improved LE strength to atleast 4/5 MMT which will improve her mechanics with daily activity.     Baseline  progress towards this    Time  8    Period  Weeks    Status  Partially Met      PT LONG TERM GOAL #2   Title  Pt will demonstrate atleast 122f increase in her 6 MWT distance, to reflect improvements in her endurance.    Time  8    Period  Weeks    Status  On-going      PT LONG TERM GOAL #3   Title  Pt will demo atleast 6 point increase in her BERG balance score to reflect improvements in balance and decrease risk of falls.     Baseline  6 points    Time  8    Period  Weeks    Status  On-going      PT LONG  TERM GOAL #4   Title  Pt will be able to carry up to 10 lb object around the clinic x1072fwithout noted unsteadiness or LOB.     Time  8    Period  Weeks    Status  New      PT LONG TERM GOAL #5   Title  Pt will be able to get up from the floor safely and without the need for cuing from PT, 2/3 trials, to allow her to get up from the ground after gardening.     Baseline  pt able to do this with UE support through coffee table    Time  8    Period  Weeks    Status  Achieved      PT LONG TERM GOAL #6   Title  Pt will report atleast 40% improvement in her ability to work in her garden from the start of PT.    Time  6    Period  Weeks    Status  New    Target Date  12/23/18            Plan - 11/29/18 1456    Clinical Impression Statement  Pt did well with today's exercises. She was able to increase resistance with leg press in addition to bringing the seat closer which would increase knee/hip flexion. Pt did have difficulty with seated trunk exercises, noting inability to maintain upright trunk with pallof press and pull to her Lt. Pt was able end with tandem walking, and although she used her SPC, her posture was much improved from previous sessions. Will continue with current POC.    Rehab Potential  Good    PT Frequency  2x / week    PT Duration  6 weeks    PT Treatment/Interventions  ADLs/Self Care Home Management;Moist Heat;Gait training;Stair training;Functional mobility training;Therapeutic activities;Therapeutic  exercise;Neuromuscular re-education;Balance training;Patient/family education;Manual techniques;Passive range of motion;Dry needling;Taping    PT Next Visit Plan  seated on unstable surface for trunk strength; walking or Nustep interval for warmup; balance activity progression    PT Home Exercise Plan  Access Code: [email or web address]     Consulted and Agree with Plan of Care  Patient       Patient will benefit from skilled therapeutic intervention in order to improve the  following deficits and impairments:  Abnormal gait, Decreased mobility, Decreased endurance, Decreased strength, Improper body mechanics, Pain, Postural dysfunction, Impaired flexibility, Increased muscle spasms, Difficulty walking  Visit Diagnosis: 1. Unsteadiness on feet   2. History of falling   3. Muscle weakness (generalized)   4. Other disturbances of skin sensation   5. Other lack of coordination        Problem List There are no active problems to display for this patient.   3:00 PM,11/29/18 Sherol Dade PT, DPT [place] [facility]  [facility] [address] [place], [place], [address] Phone: [phone]   Fax:  [phone]  Name: Jacqueline Nash MRN: [id number] Date of Birth: 10/18/1940

## 2018-12-01 ENCOUNTER — Other Ambulatory Visit: Payer: Self-pay

## 2018-12-01 ENCOUNTER — Ambulatory Visit: Payer: Medicare Other | Admitting: Physical Therapy

## 2018-12-01 ENCOUNTER — Encounter: Payer: Self-pay | Admitting: Physical Therapy

## 2018-12-01 DIAGNOSIS — R208 Other disturbances of skin sensation: Secondary | ICD-10-CM

## 2018-12-01 DIAGNOSIS — Z9181 History of falling: Secondary | ICD-10-CM

## 2018-12-01 DIAGNOSIS — M6281 Muscle weakness (generalized): Secondary | ICD-10-CM

## 2018-12-01 DIAGNOSIS — R2681 Unsteadiness on feet: Secondary | ICD-10-CM

## 2018-12-01 DIAGNOSIS — R278 Other lack of coordination: Secondary | ICD-10-CM

## 2018-12-01 NOTE — Therapy (Signed)
[facility] [address], [place], [place], [address] Phone: [phone]   Fax:  [phone]  Physical Therapy Treatment  Patient Details  Name: Jacqueline Nash MRN: [id number] Date of Birth: 06/09/1940 Referring Provider (PT): Marlou Porch, MD    Encounter Date: 12/01/2018  PT End of Session - 12/01/18 1505    Visit Number  17    Date for PT Re-Evaluation  11/09/18    Authorization Type  Medicare A and B     Authorization Time Period  09/14/18 to 11/09/18, NEW: 11/10/18 to 12/23/18    Authorization - Visit Number  16    Authorization - Number of Visits  20    PT Start Time  9030    PT Stop Time  0923    PT Time Calculation (min)  39 min    Activity Tolerance  Patient tolerated treatment well;No increased pain    Behavior During Therapy  WFL for tasks assessed/performed       Past Medical History:  Diagnosis Date  . Cancer ([place])   . Depression   . Hyperlipidemia   . Hypothyroid   . Insomnia   . Migraine   . Osteoporosis   . Peripheral neuropathy   . Scoliosis   . Thyroid cancer ([place]) 1970  . White coat hypertension     History reviewed. No pertinent surgical history.  There were no vitals filed for this visit.  Subjective Assessment - 12/01/18 1421    Subjective  Pt states that she was able to work for more than 3 hours in her garden yesterday. She was a little stiff today.    Limitations  Standing;Walking    Patient Stated Goals  improve balance and stamina    Currently in Pain?  No/denies                       [facility] Adult PT Treatment/Exercise - 12/01/18 0001      Knee/Hip Exercises: Machines for Strengthening   Total Gym Leg Press  Seat 6: incline 3 single leg #75 2x10 reps each       Knee/Hip Exercises: Seated   Other Seated Knee/Hip Exercises  seated on dyna disc with hip flexion, hands on abdomen x12 reps each; opposite arm and leg lift x10 reps each     Other Seated  Knee/Hip Exercises  seated on dyna disc with trunk rotation holding yellow weighted ball x10 reps feet apart x7 reps feet together       Ankle Exercises: Seated   Heel Raises  Both    Other Seated Ankle Exercises  heel raises with 10# at knees, completed to fatigue          Balance Exercises - 12/01/18 1451      Balance Exercises: Standing   Standing Eyes Closed  Narrow base of support (BOS);2 reps;Solid surface;20 secs    Tandem Stance  Eyes open;1 rep;30 secs   partial tandem        PT Education - 12/01/18 1505    Education Details  technique with therex    Person(s) Educated  Patient    Methods  Explanation    Comprehension  Verbalized understanding       PT Short Term Goals - 11/04/18 1504      PT SHORT TERM GOAL #1   Title  be independent in initial HEP    Time  4    Period  Weeks  Status  Achieved    Target Date  10/12/18      PT SHORT TERM GOAL #2   Title  Pt will be able to ambulate with improved foot clearance and step length to decrease risk of falling throughout the day.    Baseline  pt has removed throw rugs and feels that she is less likely to fall with this, still noting decreased Rt foot clearance    Time  4    Period  Weeks    Status  Partially Met        PT Long Term Goals - 11/04/18 1507      PT LONG TERM GOAL #1   Title  Pt will demo improved LE strength to atleast 4/5 MMT which will improve her mechanics with daily activity.     Baseline  progress towards this    Time  8    Period  Weeks    Status  Partially Met      PT LONG TERM GOAL #2   Title  Pt will demonstrate atleast 122f increase in her 6 MWT distance, to reflect improvements in her endurance.    Time  8    Period  Weeks    Status  On-going      PT LONG TERM GOAL #3   Title  Pt will demo atleast 6 point increase in her BERG balance score to reflect improvements in balance and decrease risk of falls.     Baseline  6 points    Time  8    Period  Weeks    Status  On-going       PT LONG TERM GOAL #4   Title  Pt will be able to carry up to 10 lb object around the clinic x1019fwithout noted unsteadiness or LOB.     Time  8    Period  Weeks    Status  New      PT LONG TERM GOAL #5   Title  Pt will be able to get up from the floor safely and without the need for cuing from PT, 2/3 trials, to allow her to get up from the ground after gardening.     Baseline  pt able to do this with UE support through coffee table    Time  8    Period  Weeks    Status  Achieved      PT LONG TERM GOAL #6   Title  Pt will report atleast 40% improvement in her ability to work in her garden from the start of PT.    Time  6    Period  Weeks    Status  New    Target Date  12/23/18            Plan - 12/01/18 1508    Clinical Impression Statement  Pt is making functional improvements, noting that she was able to complete greater than 3 hours of yardwork without the need for a rest break or increase in pain. Session continued with trunk and LE strength progressions. She was able to progress seated dynamic trunk activity with both UE and LE movements, although this did cause intermittent increased posterior trunk lean without therapist cuing. Pt was able to step into partial tandem without UE support, but continues to have difficulty with weight shift and unsteadiness with the Rt LE forward due to Lt gluteal weakness.    Rehab Potential  Good    PT Frequency  2x /  week    PT Duration  6 weeks    PT Treatment/Interventions  ADLs/Self Care Home Management;Moist Heat;Gait training;Stair training;Functional mobility training;Therapeutic activities;Therapeutic exercise;Neuromuscular re-education;Balance training;Patient/family education;Manual techniques;Passive range of motion;Dry needling;Taping    PT Next Visit Plan  seated on physioball; kneeling on foam upright posture hold; walking or Nustep interval for warmup; balance activity progression; leg press    PT Home Exercise Plan   Access Code: [email or web address]     Consulted and Agree with Plan of Care  Patient       Patient will benefit from skilled therapeutic intervention in order to improve the following deficits and impairments:  Abnormal gait, Decreased mobility, Decreased endurance, Decreased strength, Improper body mechanics, Pain, Postural dysfunction, Impaired flexibility, Increased muscle spasms, Difficulty walking  Visit Diagnosis: Unsteadiness on feet  History of falling  Muscle weakness (generalized)  Other disturbances of skin sensation  Other lack of coordination     Problem List There are no active problems to display for this patient.    3:12 PM,12/01/18 Sherol Dade PT, [place] [facility] [place]  [facility] [address], [place], [place], [address] Phone: [phone]   Fax:  [phone]  Name: Mallerie Blok MRN: [id number] Date of Birth: 1940/10/15

## 2018-12-06 ENCOUNTER — Ambulatory Visit: Payer: Medicare Other | Admitting: Physical Therapy

## 2018-12-06 ENCOUNTER — Encounter: Payer: Self-pay | Admitting: Physical Therapy

## 2018-12-06 ENCOUNTER — Other Ambulatory Visit: Payer: Self-pay

## 2018-12-06 DIAGNOSIS — R208 Other disturbances of skin sensation: Secondary | ICD-10-CM

## 2018-12-06 DIAGNOSIS — Z9181 History of falling: Secondary | ICD-10-CM

## 2018-12-06 DIAGNOSIS — R278 Other lack of coordination: Secondary | ICD-10-CM

## 2018-12-06 DIAGNOSIS — M6281 Muscle weakness (generalized): Secondary | ICD-10-CM

## 2018-12-06 DIAGNOSIS — R2681 Unsteadiness on feet: Secondary | ICD-10-CM

## 2018-12-06 NOTE — Therapy (Signed)
[facility] [address] [place], [place], [address] Phone: [phone]   Fax:  [phone]  Physical Therapy Treatment  Patient Details  Name: Jacqueline Nash MRN: [id number] Date of Birth: [birth year] Referring Provider (PT): Marlou Porch, MD    Encounter Date: 12/06/2018  PT End of Session - 12/06/18 1436    Visit Number  18    Date for PT Re-Evaluation  11/09/18    Authorization Type  Medicare A and B     Authorization Time Period  09/14/18 to 11/09/18, NEW: 11/10/18 to 12/23/18    Authorization - Visit Number  17    Authorization - Number of Visits  20    PT Start Time  7902    PT Stop Time  1455    PT Time Calculation (min)  41 min    Activity Tolerance  Patient tolerated treatment well;No increased pain    Behavior During Therapy  WFL for tasks assessed/performed       Past Medical History:  Diagnosis Date  . Cancer ([place])   . Depression   . Hyperlipidemia   . Hypothyroid   . Insomnia   . Migraine   . Osteoporosis   . Peripheral neuropathy   . Scoliosis   . Thyroid cancer ([place]) 1970  . White coat hypertension     History reviewed. No pertinent surgical history.  There were no vitals filed for this visit.  Subjective Assessment - 12/06/18 1426    Subjective  Pt states that she is having difficulty with separating her feet at home. No other complaints.    Limitations  Standing;Walking    Patient Stated Goals  improve balance and stamina    Currently in Pain?  No/denies                       [facility] Adult PT Treatment/Exercise - 12/06/18 0001      Knee/Hip Exercises: Aerobic   Other Aerobic  circuit 3x: 2 min walking/30 sec sit to stand, 30 sec rest break between sets      Knee/Hip Exercises: Machines for Strengthening   Total Gym Leg Press  Seat 6: incline 3 single leg #75 3x10 reps each           Balance Exercises - 12/06/18 1437      Balance Exercises: Standing   Standing Eyes Opened  Narrow base of support (BOS);Solid surface   trunk rotation reach across body x10   Standing, One Foot on a Step  Eyes open;6 inch;4 reps;10 secs   able to step without UE     Balance Exercises: Seated   Other Seated Exercises  sitting on green physioball with alternating LAQ x5 reps, hands resting on thighs         PT Education - 12/06/18 1458    Education Details  decrease TB color with HEP    Person(s) Educated  Patient    Methods  Explanation    Comprehension  Verbalized understanding       PT Short Term Goals - 11/04/18 1504      PT SHORT TERM GOAL #1   Title  be independent in initial HEP    Time  4    Period  Weeks    Status  Achieved    Target Date  10/12/18      PT SHORT TERM GOAL #2   Title  Pt will be able to ambulate with improved foot clearance  and step length to decrease risk of falling throughout the day.    Baseline  pt has removed throw rugs and feels that she is less likely to fall with this, still noting decreased Rt foot clearance    Time  4    Period  Weeks    Status  Partially Met        PT Long Term Goals - 11/04/18 1507      PT LONG TERM GOAL #1   Title  Pt will demo improved LE strength to atleast 4/5 MMT which will improve her mechanics with daily activity.     Baseline  progress towards this    Time  8    Period  Weeks    Status  Partially Met      PT LONG TERM GOAL #2   Title  Pt will demonstrate atleast 136f increase in her 6 MWT distance, to reflect improvements in her endurance.    Time  8    Period  Weeks    Status  On-going      PT LONG TERM GOAL #3   Title  Pt will demo atleast 6 point increase in her BERG balance score to reflect improvements in balance and decrease risk of falls.     Baseline  6 points    Time  8    Period  Weeks    Status  On-going      PT LONG TERM GOAL #4   Title  Pt will be able to carry up to 10 lb object around the clinic x103fwithout noted unsteadiness or LOB.     Time  8     Period  Weeks    Status  New      PT LONG TERM GOAL #5   Title  Pt will be able to get up from the floor safely and without the need for cuing from PT, 2/3 trials, to allow her to get up from the ground after gardening.     Baseline  pt able to do this with UE support through coffee table    Time  8    Period  Weeks    Status  Achieved      PT LONG TERM GOAL #6   Title  Pt will report atleast 40% improvement in her ability to work in her garden from the start of PT.    Time  6    Period  Weeks    Status  New    Target Date  12/23/18            Plan - 12/06/18 1458    Clinical Impression Statement  Pt participated in a walking circuit at the start of today's session. She was able to complete nearly 2 laps within the 2 minutes, but this did decrease some by the 3rd set. Pt was able to increase sets with single leg press, although she did have visible muscle shaking with the last set. Remainder of the session focused on standing and seated balance with physioball to challenge trunk stability and strength. Pt denied any increase in pain end of session but noted LE fatigue which is appropriate following the exercises and activities completed today.    Rehab Potential  Good    PT Frequency  2x / week    PT Duration  6 weeks    PT Treatment/Interventions  ADLs/Self Care Home Management;Moist Heat;Gait training;Stair training;Functional mobility training;Therapeutic activities;Therapeutic exercise;Neuromuscular re-education;Balance training;Patient/family education;Manual techniques;Passive range of motion;Dry  needling;Taping    PT Next Visit Plan  f/u on HEP with red TB; seated on physioball; kneeling on foam upright posture hold; walking or Nustep interval for warmup; balance activity progression; leg press    PT Home Exercise Plan  Access Code: [email or web address]     Consulted and Agree with Plan of Care  Patient       Patient will benefit from skilled therapeutic intervention in order to  improve the following deficits and impairments:  Abnormal gait, Decreased mobility, Decreased endurance, Decreased strength, Improper body mechanics, Pain, Postural dysfunction, Impaired flexibility, Increased muscle spasms, Difficulty walking  Visit Diagnosis: Unsteadiness on feet  History of falling  Muscle weakness (generalized)  Other disturbances of skin sensation  Other lack of coordination     Problem List There are no active problems to display for this patient.   3:00 PM,12/06/18 [place], [place] [facility] [place]  [facility] [address] [place], [place], [address] Phone: [phone]   Fax:  [phone]  Name: Jacqueline Nash MRN: [id number] Date of Birth: Dec 28, 1940

## 2018-12-08 ENCOUNTER — Other Ambulatory Visit: Payer: Self-pay

## 2018-12-08 ENCOUNTER — Ambulatory Visit: Payer: Medicare Other | Admitting: Physical Therapy

## 2018-12-08 ENCOUNTER — Encounter: Payer: Self-pay | Admitting: Physical Therapy

## 2018-12-08 DIAGNOSIS — R2681 Unsteadiness on feet: Secondary | ICD-10-CM

## 2018-12-08 DIAGNOSIS — R208 Other disturbances of skin sensation: Secondary | ICD-10-CM

## 2018-12-08 DIAGNOSIS — M6281 Muscle weakness (generalized): Secondary | ICD-10-CM

## 2018-12-08 DIAGNOSIS — Z9181 History of falling: Secondary | ICD-10-CM

## 2018-12-08 DIAGNOSIS — R278 Other lack of coordination: Secondary | ICD-10-CM

## 2018-12-08 NOTE — Therapy (Signed)
[facility] [address] [place], [place], [address] Phone: [phone]   Fax:  [phone]  Physical Therapy Treatment  Patient Details  Name: Jacqueline Nash MRN: [id number] Date of Birth: Mar 14, 1941 Referring Provider (PT): Marlou Porch, MD    Encounter Date: 12/08/2018  PT End of Session - 12/08/18 1507    Visit Number  19    Date for PT Re-Evaluation  11/09/18    Authorization Type  Medicare A and B     Authorization Time Period  09/14/18 to 11/09/18, NEW: 11/10/18 to 12/23/18    Authorization - Visit Number  18    Authorization - Number of Visits  20    PT Start Time  7001    PT Stop Time  7494    PT Time Calculation (min)  43 min    Activity Tolerance  Patient tolerated treatment well;No increased pain    Behavior During Therapy  WFL for tasks assessed/performed       Past Medical History:  Diagnosis Date  . Cancer ([place])   . Depression   . Hyperlipidemia   . Hypothyroid   . Insomnia   . Migraine   . Osteoporosis   . Peripheral neuropathy   . Scoliosis   . Thyroid cancer ([place]) 1970  . White coat hypertension     History reviewed. No pertinent surgical history.  There were no vitals filed for this visit.  Subjective Assessment - 12/08/18 1418    Subjective  Pt states that she was stiff yesterday after her session. She was still able to do her HEP and feels that the red band is much better.    Limitations  Standing;Walking    Patient Stated Goals  improve balance and stamina    Currently in Pain?  No/denies                       [facility] Adult PT Treatment/Exercise - 12/08/18 0001      Knee/Hip Exercises: Stretches   Other Knee/Hip Stretches  Lt and Rt glute max stretch 5x10 sec each LE      Knee/Hip Exercises: Aerobic   Nustep  L1 x5 min, PT present to discuss post workout soreness       Knee/Hip Exercises: Standing   Other Standing Knee Exercises  tall kneel on foam pad  4x20 sec hold       Knee/Hip Exercises: Seated   Other Seated Knee/Hip Exercises  seated on dyna disc with NBOS and LAQ x10 reps hands on thighs     Other Seated Knee/Hip Exercises  hip 90/90 rotation stretch x10 reps each direction      Modalities   Modalities  Moist Heat      Moist Heat Therapy   Number Minutes Moist Heat  --   during supine stretches/exercises   Moist Heat Location  Lumbar Spine      Manual Therapy   Manual therapy comments  Lt and Rt glute/low back rotation stretch 5x10 sec each             PT Education - 12/08/18 1503    Education Details  expected soreness and benefits of stretching and gentle strengthening over the weekend    Person(s) Educated  Patient    Methods  Explanation    Comprehension  Verbalized understanding       PT Short Term Goals - 11/04/18 1504      PT SHORT TERM GOAL #1  Title  be independent in initial HEP    Time  4    Period  Weeks    Status  Achieved    Target Date  10/12/18      PT SHORT TERM GOAL #2   Title  Pt will be able to ambulate with improved foot clearance and step length to decrease risk of falling throughout the day.    Baseline  pt has removed throw rugs and feels that she is less likely to fall with this, still noting decreased Rt foot clearance    Time  4    Period  Weeks    Status  Partially Met        PT Long Term Goals - 11/04/18 1507      PT LONG TERM GOAL #1   Title  Pt will demo improved LE strength to atleast 4/5 MMT which will improve her mechanics with daily activity.     Baseline  progress towards this    Time  8    Period  Weeks    Status  Partially Met      PT LONG TERM GOAL #2   Title  Pt will demonstrate atleast 164f increase in her 6 MWT distance, to reflect improvements in her endurance.    Time  8    Period  Weeks    Status  On-going      PT LONG TERM GOAL #3   Title  Pt will demo atleast 6 point increase in her BERG balance score to reflect improvements in balance and  decrease risk of falls.     Baseline  6 points    Time  8    Period  Weeks    Status  On-going      PT LONG TERM GOAL #4   Title  Pt will be able to carry up to 10 lb object around the clinic x1069fwithout noted unsteadiness or LOB.     Time  8    Period  Weeks    Status  New      PT LONG TERM GOAL #5   Title  Pt will be able to get up from the floor safely and without the need for cuing from PT, 2/3 trials, to allow her to get up from the ground after gardening.     Baseline  pt able to do this with UE support through coffee table    Time  8    Period  Weeks    Status  Achieved      PT LONG TERM GOAL #6   Title  Pt will report atleast 40% improvement in her ability to work in her garden from the start of PT.    Time  6    Period  Weeks    Status  New    Target Date  12/23/18            Plan - 12/08/18 1507    Clinical Impression Statement  Pt arrived with increased hip and trunk soreness following her last session, but she was able to do her HEP on her day off despite this. Attempted to complete trunk strengthening, pt was able to maintain upright posture in tall kneel for 20 sec at a time, however she struggled with seated balance activity secondary to muscle soreness from her previous session. Focused more on decreasing hip/trunk stiffness with supine stretches while pt had a heating pad on her low back. End of session felt that this  helped decrease her stiffness. Will plan to pick back up on balance and strengthening exercises next session as able.    Rehab Potential  Good    PT Frequency  2x / week    PT Duration  6 weeks    PT Treatment/Interventions  ADLs/Self Care Home Management;Moist Heat;Gait training;Stair training;Functional mobility training;Therapeutic activities;Therapeutic exercise;Neuromuscular re-education;Balance training;Patient/family education;Manual techniques;Passive range of motion;Dry needling;Taping    PT Next Visit Plan  seated on physioball;  kneeling on foam upright posture hold; walking or Nustep interval for warmup; balance activity progression; leg press    PT Home Exercise Plan  Access Code: [email or web address]     Consulted and Agree with Plan of Care  Patient       Patient will benefit from skilled therapeutic intervention in order to improve the following deficits and impairments:  Abnormal gait, Decreased mobility, Decreased endurance, Decreased strength, Improper body mechanics, Pain, Postural dysfunction, Impaired flexibility, Increased muscle spasms, Difficulty walking  Visit Diagnosis: Unsteadiness on feet  History of falling  Muscle weakness (generalized)  Other disturbances of skin sensation  Other lack of coordination     Problem List There are no active problems to display for this patient.   3:11 PM,12/08/18 Sherol Dade PT, DPT [place] [facility] [place]  [facility] [address] [place], [place], [address] Phone: [phone]   Fax:  [phone]  Name: Khamya Topp MRN: [id number] Date of Birth: January 30, 1941

## 2018-12-13 ENCOUNTER — Ambulatory Visit: Payer: Medicare Other | Attending: Family Medicine | Admitting: Physical Therapy

## 2018-12-13 ENCOUNTER — Other Ambulatory Visit: Payer: Self-pay

## 2018-12-13 DIAGNOSIS — R278 Other lack of coordination: Secondary | ICD-10-CM | POA: Diagnosis present

## 2018-12-13 DIAGNOSIS — R208 Other disturbances of skin sensation: Secondary | ICD-10-CM | POA: Insufficient documentation

## 2018-12-13 DIAGNOSIS — R2681 Unsteadiness on feet: Secondary | ICD-10-CM | POA: Diagnosis present

## 2018-12-13 DIAGNOSIS — M6281 Muscle weakness (generalized): Secondary | ICD-10-CM | POA: Diagnosis present

## 2018-12-13 DIAGNOSIS — Z9181 History of falling: Secondary | ICD-10-CM

## 2018-12-13 NOTE — Therapy (Signed)
[facility] [address], [place], [place], [address] Phone: [phone]   Fax:  [phone]  Physical Therapy Treatment  Patient Details  Name: Jacqueline Nash MRN: [id number] Date of Birth: 1941-03-27 Referring Provider (PT): Marlou Porch, MD    Encounter Date: 12/13/2018  PT End of Session - 12/13/18 1435    Visit Number  20    Date for PT Re-Evaluation  11/09/18    Authorization Type  Medicare A and B     Authorization Time Period  09/14/18 to 11/09/18, NEW: 11/10/18 to 12/23/18    Authorization - Visit Number  19    Authorization - Number of Visits  20    PT Start Time  0093    Activity Tolerance  Patient tolerated treatment well;No increased pain    Behavior During Therapy  WFL for tasks assessed/performed       Past Medical History:  Diagnosis Date  . Cancer ([place])   . Depression   . Hyperlipidemia   . Hypothyroid   . Insomnia   . Migraine   . Osteoporosis   . Peripheral neuropathy   . Scoliosis   . Thyroid cancer ([place]) 1970  . White coat hypertension     No past surgical history on file.  There were no vitals filed for this visit.  Subjective Assessment - 12/13/18 1437    Subjective  Pt feels good today. She still has some soreness in her back but this is normal.    Limitations  Standing;Walking    Patient Stated Goals  improve balance and stamina    Currently in Pain?  No/denies         [facility] PT Assessment - 12/13/18 0001      Strength   Right Hip Flexion  4+/5    Right Hip Extension  4+/5    Right Hip ABduction  3+/5    Left Hip Flexion  4+/5    Left Hip Extension  4+/5    Left Hip ABduction  3+/5    Right Knee Extension  5/5    Left Knee Extension  5/5    Right Ankle Dorsiflexion  5/5    Right Ankle Inversion  5/5    Right Ankle Eversion  5/5    Left Ankle Dorsiflexion  5/5    Left Ankle Inversion  5/5    Left Ankle Eversion  4+/5      6 minute walk test results    Endurance additional comments  6 MWT with SPC: 35 ft                  PT Education - 12/13/18 1609    Education Details  discussed updates to HEP; altering HEP to focus on remaining limitations in hip strength    Person(s) Educated  Patient    Methods  Explanation;Handout    Comprehension  Verbalized understanding       PT Short Term Goals - 12/13/18 1442      PT SHORT TERM GOAL #1   Title  be independent in initial HEP    Time  4    Period  Weeks    Status  Achieved    Target Date  10/12/18      PT SHORT TERM GOAL #2   Title  Pt will be able to ambulate with improved foot clearance and step length to decrease risk of falling throughout the day.    Baseline  pt has removed throw  rugs and feels that she is less likely to fall with this, still noting decreased Rt foot clearance    Time  4    Period  Weeks    Status  Partially Met        PT Long Term Goals - 12/13/18 1442      PT LONG TERM GOAL #1   Title  Pt will demo improved LE strength to atleast 4/5 MMT which will improve her mechanics with daily activity.     Baseline  progress towards this    Time  8    Period  Weeks    Status  Partially Met      PT LONG TERM GOAL #2   Title  Pt will demonstrate atleast 135f increase in her 6 MWT distance, to reflect improvements in her endurance.    Baseline  1764fimprovement without rest break    Time  8    Period  Weeks    Status  On-going      PT LONG TERM GOAL #3   Title  Pt will demo atleast 6 point increase in her BERG balance score to reflect improvements in balance and decrease risk of falls.     Baseline  6 points    Time  8    Period  Weeks    Status  On-going      PT LONG TERM GOAL #4   Title  Pt will be able to carry up to 10 lb object around the clinic x10065fithout noted unsteadiness or LOB.     Time  8    Period  Weeks    Status  New      PT LONG TERM GOAL #5   Title  Pt will be able to get up from the floor safely and without the need  for cuing from PT, 2/3 trials, to allow her to get up from the ground after gardening.     Baseline  pt able to do this with UE support through coffee table    Time  8    Period  Weeks    Status  Achieved      PT LONG TERM GOAL #6   Title  Pt will report atleast 40% improvement in her ability to work in her garden from the start of PT.    Baseline  25% improved    Time  6    Period  Weeks    Status  Partially Met            Plan - 12/13/18 1603    Clinical Impression Statement  Pt is making steady progress towards her goals. She demonstrates improvements in her 6 MWT today, ambulating greater than 170f5fre than her previous assessment. Pt's LE strength is also increased, noting 4+/5 MMT strength with hip extension. Pt feels that functionally she is able to do more in her garden and around the home. Will plan to complete BERG at pt's next appointment to determine further improvements in balance and determine need for PT POC changes moving forward.    Rehab Potential  Good    PT Frequency  2x / week    PT Duration  6 weeks    PT Treatment/Interventions  ADLs/Self Care Home Management;Moist Heat;Gait training;Stair training;Functional mobility training;Therapeutic activities;Therapeutic exercise;Neuromuscular re-education;Balance training;Patient/family education;Manual techniques;Passive range of motion;Dry needling;Taping    PT Next Visit Plan  BERG; send re-cert    PT Home Exercise Plan  Access Code: [email or web address]  Consulted and Agree with Plan of Care  Patient       Patient will benefit from skilled therapeutic intervention in order to improve the following deficits and impairments:  Abnormal gait, Decreased mobility, Decreased endurance, Decreased strength, Improper body mechanics, Pain, Postural dysfunction, Impaired flexibility, Increased muscle spasms, Difficulty walking  Visit Diagnosis: Unsteadiness on feet  History of falling  Muscle weakness (generalized)  Other  disturbances of skin sensation  Other lack of coordination     Problem List There are no active problems to display for this patient.  4:10 PM,12/13/18 [place], [place] at [place]  [facility] [address], [place], [place], [address] Phone: [phone]   Fax:  [phone]  Name: Jacqueline Nash MRN: [id number] Date of Birth: 07-29-1940

## 2018-12-20 ENCOUNTER — Other Ambulatory Visit: Payer: Self-pay

## 2018-12-20 ENCOUNTER — Ambulatory Visit: Payer: Medicare Other | Admitting: Physical Therapy

## 2018-12-20 ENCOUNTER — Encounter: Payer: Self-pay | Admitting: Physical Therapy

## 2018-12-20 DIAGNOSIS — M6281 Muscle weakness (generalized): Secondary | ICD-10-CM

## 2018-12-20 DIAGNOSIS — R2681 Unsteadiness on feet: Secondary | ICD-10-CM | POA: Diagnosis not present

## 2018-12-20 DIAGNOSIS — Z9181 History of falling: Secondary | ICD-10-CM

## 2018-12-20 DIAGNOSIS — R278 Other lack of coordination: Secondary | ICD-10-CM

## 2018-12-20 DIAGNOSIS — R208 Other disturbances of skin sensation: Secondary | ICD-10-CM

## 2018-12-20 NOTE — Therapy (Signed)
[facility] [address] [place], [place], [address] Phone: [phone]   Fax:  [phone]  Physical Therapy Treatment/Re-evaluation  Patient Details  Name: Jacqueline Nash MRN: [id number] Date of Birth: 02/02/1941 Referring Provider (PT): Marlou Porch, MD    Encounter Date: 12/20/2018  PT End of Session - 12/20/18 1506    Visit Number  21    Date for PT Re-Evaluation  02/03/19    Authorization Type  Medicare A and B     Authorization Time Period  NEW: 12/24/18 to 02/03/19    Authorization - Visit Number  20    Authorization - Number of Visits  30    PT Start Time  9150    PT Stop Time  1500    PT Time Calculation (min)  41 min    Activity Tolerance  Patient tolerated treatment well;No increased pain    Behavior During Therapy  WFL for tasks assessed/performed       Past Medical History:  Diagnosis Date  . Cancer ([place])   . Depression   . Hyperlipidemia   . Hypothyroid   . Insomnia   . Migraine   . Osteoporosis   . Peripheral neuropathy   . Scoliosis   . Thyroid cancer ([place]) 1970  . White coat hypertension     History reviewed. No pertinent surgical history.  There were no vitals filed for this visit.  Subjective Assessment - 12/20/18 1525    Subjective  Pt feels that she is improved with her walking but this will get worse when she is tired or at the beginning of the day.    Limitations  Standing;Walking    Patient Stated Goals  improve balance and stamina    Multiple Pain Sites  No         [facility] PT Assessment - 12/20/18 0001      Strength   Right Hip Flexion  4+/5    Right Hip Extension  4+/5    Right Hip ABduction  3+/5    Left Hip Flexion  4+/5    Left Hip Extension  4+/5    Left Hip ABduction  3+/5    Right Knee Extension  5/5    Left Knee Extension  5/5    Right Ankle Dorsiflexion  5/5    Right Ankle Inversion  5/5    Right Ankle Eversion  5/5    Left Ankle Dorsiflexion  5/5     Left Ankle Inversion  5/5    Left Ankle Eversion  4+/5      Transfers   Five time sit to stand comments   12 sec, no UE support       6 minute walk test results    Endurance additional comments  6 MWT with SPC: 492 ft      Berg Balance Test   Sit to Stand  Able to stand without using hands and stabilize independently    Standing Unsupported  Able to stand safely 2 minutes    Sitting with Back Unsupported but Feet Supported on Floor or Stool  Able to sit safely and securely 2 minutes    Stand to Sit  Sits safely with minimal use of hands    Transfers  Able to transfer safely, minor use of hands    Standing Unsupported with Eyes Closed  Able to stand 10 seconds safely    Standing Unsupported with Feet Together  Able to place feet together independently and  stand 1 minute safely    From Standing, Reach Forward with Outstretched Arm  Can reach confidently >25 cm (10")    From Standing Position, Pick up Object from [place] to pick up shoe safely and easily    From Standing Position, Turn to Look Behind Over each Shoulder  Looks behind from both sides and weight shifts well    Turn 360 Degrees  Able to turn 360 degrees safely but slowly    Standing Unsupported, Alternately Place Feet on Step/Stool  Able to stand independently and safely and complete 8 steps in 20 seconds    Standing Unsupported, One Foot in Front  Able to plae foot ahead of the other independently and hold 30 seconds   able to place in tandem 20 sec   Standing on One Leg  Tries to lift leg/unable to hold 3 seconds but remains standing independently   Rt 3 sec, Lt 2 sec    Total Score  50                   [facility] Adult PT Treatment/Exercise - 12/20/18 0001      Knee/Hip Exercises: Standing   Other Standing Knee Exercises  hip hike from step BUE support x10 reps on Rt, x5 reps on Lt     Other Standing Knee Exercises  BUE pressdown with green TB x10 reps, PT cuing to decrease shoulder shrug       Ankle  Exercises: Seated   Other Seated Ankle Exercises  Rt and Lt ankle PF with blue TB x20 reps              PT Education - 12/20/18 1506    Education Details  updated HEP    Person(s) Educated  Patient    Methods  Explanation;Verbal cues;Handout    Comprehension  Verbalized understanding;Returned demonstration       PT Short Term Goals - 12/20/18 1428      PT SHORT TERM GOAL #1   Title  be independent in initial HEP    Time  4    Period  Weeks    Status  Achieved    Target Date  10/12/18      PT SHORT TERM GOAL #2   Title  Pt will be able to ambulate with improved foot clearance and step length to decrease risk of falling throughout the day.    Baseline  pt has removed throw rugs and feels that she is less likely to fall with this, still noting decreased Rt foot clearance    Time  4    Period  Weeks    Status  Partially Met        PT Long Term Goals - 12/20/18 1429      PT LONG TERM GOAL #1   Title  Pt will demo improved LE strength to atleast 4/5 MMT which will improve her mechanics with daily activity.     Baseline  progress towards this, still hip abduction 3/5 MMT    Time  8    Period  Weeks    Status  Partially Met      PT LONG TERM GOAL #2   Title  Pt will demonstrate atleast 165f increase in her 6 MWT distance, to reflect improvements in her endurance.    Baseline  174fimprovement without rest break    Time  8    Period  Weeks    Status  On-going  PT LONG TERM GOAL #3   Title  Pt will demo atleast 6 point increase in her BERG balance score to reflect improvements in balance and decrease risk of falls.     Baseline  6 points    Time  8    Period  Weeks    Status  On-going      PT LONG TERM GOAL #4   Title  Pt will be able to carry up to 10 lb object around the clinic x150f without noted unsteadiness or LOB.     Time  6    Period  Weeks    Status  New      PT LONG TERM GOAL #5   Title  Pt will be able to get up from the floor safely and  without the need for cuing from PT, 2/3 trials, to allow her to get up from the ground after gardening.     Baseline  pt able to do this with UE support through coffee table    Time  8    Period  Weeks    Status  Achieved      Additional Long Term Goals   Additional Long Term Goals  Yes      PT LONG TERM GOAL #6   Title  Pt will report atleast 40% improvement in her ability to work in her garden from the start of PT.    Baseline  25% improved    Time  6    Period  Weeks    Status  Partially Met      PT LONG TERM GOAL #7   Title  Pt will demo improved single leg proprioception and strength, evident by her ability to maintain SLS for 5 sec 2/3 trials.    Time  6    Period  Weeks    Status  New            Plan - 12/20/18 1510    Clinical Impression Statement  Pt is making slow progress towards her PT goals. She demonstrates improvements in LE endurance and strength, noting atleast 1746fimprovement in her 6 MWT and increase in LE strength to 4/5 MMT in all but the Lt hip abductors. Pt has had steady improvements in her balance since beginning PT, with this up to 50/56. Although not improved from 4 weeks ago, her proprioception in tandem positions is greatly improved, noting up to 20 sec hold with each LE hold. Pt has been consistently keeping up with her HEP and this continues to be regularly updated. She still has limitations in LE endurance and strength, particularly with the Lt hip. This leads to shuffling gait and excessive trunk lean which places her at an increased risk of falling at home and in the community. Pt would continue to benefit from skilled PT with a decrease in PT frequency moving forward in order to allow further work on balance and activity safety while she continues with her HEP.    Rehab Potential  Good    PT Frequency  1x / week    PT Duration  6 weeks    PT Treatment/Interventions  ADLs/Self Care Home Management;Moist Heat;Gait training;Stair training;Functional  mobility training;Therapeutic activities;Therapeutic exercise;Neuromuscular re-education;Balance training;Patient/family education;Manual techniques;Passive range of motion;Dry needling;Taping    PT Next Visit Plan  f/u on side stepping with band around feet; balance activity    PT Home Exercise Plan  Access Code: [email or web address]     Consulted and Agree with Plan of  Care  Patient       Patient will benefit from skilled therapeutic intervention in order to improve the following deficits and impairments:  Abnormal gait, Decreased mobility, Decreased endurance, Decreased strength, Improper body mechanics, Pain, Postural dysfunction, Impaired flexibility, Increased muscle spasms, Difficulty walking  Visit Diagnosis: Unsteadiness on feet  History of falling  Muscle weakness (generalized)  Other disturbances of skin sensation  Other lack of coordination     Problem List There are no active problems to display for this patient.   3:46 PM,12/20/18 [place], [place] [facility] [address], [place], [place], [address] Phone: [phone]   Fax:  [phone]  Name: Jacqueline Nash MRN: [id number] Date of Birth: [birth year]

## 2018-12-20 NOTE — Patient Instructions (Signed)
Access Code: [email or web address]  URL: [email or web address]  Date: 12/20/2018  Prepared by: Sherol Dade   Exercises  Standing Anti-Rotation Press with Anchored Resistance - 15 reps - 2 sets - 2x daily - 7x weekly  Sit to Stand with Arms Crossed - 5-10 reps - 2x daily - 7x weekly  Side Stepping with Resistance at Feet - 10 reps - 2 sets - 1x daily - 7x weekly  Standing Hip Hiking - 10 reps - 2x daily - 7x weekly  Long Sitting Ankle Plantar Flexion with Resistance - 20 reps - 2 sets - 1x daily - 7x weekly  Standing Single Leg Stance with Unilateral Counter Support - 10 reps - 3 sets - 1x daily - 7x weekly    [facility] [address], [place], [place] [address] Phone # [phone] Fax [phone]

## 2018-12-27 ENCOUNTER — Ambulatory Visit: Payer: Medicare Other | Admitting: Physical Therapy

## 2018-12-27 ENCOUNTER — Other Ambulatory Visit: Payer: Self-pay

## 2018-12-27 ENCOUNTER — Encounter: Payer: Self-pay | Admitting: Physical Therapy

## 2018-12-27 DIAGNOSIS — R278 Other lack of coordination: Secondary | ICD-10-CM

## 2018-12-27 DIAGNOSIS — M6281 Muscle weakness (generalized): Secondary | ICD-10-CM

## 2018-12-27 DIAGNOSIS — R2681 Unsteadiness on feet: Secondary | ICD-10-CM | POA: Diagnosis not present

## 2018-12-27 DIAGNOSIS — Z9181 History of falling: Secondary | ICD-10-CM

## 2018-12-27 DIAGNOSIS — R208 Other disturbances of skin sensation: Secondary | ICD-10-CM

## 2018-12-27 NOTE — Therapy (Signed)
[facility] [address] [place], [place], [address] Phone: [phone]   Fax:  [phone]  Physical Therapy Treatment  Patient Details  Name: Jacqueline Nash MRN: [id number] Date of Birth: [birth year] Referring Provider (PT): Marlou Porch, MD    Encounter Date: 12/27/2018  PT End of Session - 12/27/18 1434    Visit Number  22    Date for PT Re-Evaluation  02/03/19    Authorization Type  Medicare A and B     Authorization Time Period  NEW: 12/24/18 to 02/03/19    Authorization - Visit Number  21    Authorization - Number of Visits  30    PT Start Time  1324    PT Stop Time  4010    PT Time Calculation (min)  39 min    Activity Tolerance  Patient tolerated treatment well;No increased pain    Behavior During Therapy  WFL for tasks assessed/performed       Past Medical History:  Diagnosis Date  . Cancer ([place])   . Depression   . Hyperlipidemia   . Hypothyroid   . Insomnia   . Migraine   . Osteoporosis   . Peripheral neuropathy   . Scoliosis   . Thyroid cancer ([place]) 1970  . White coat hypertension     History reviewed. No pertinent surgical history.  There were no vitals filed for this visit.  Subjective Assessment - 12/27/18 1422    Subjective  Pt states that her knees bothered her yesterday and she thinks this must be from the sit to stand.    Limitations  Standing;Walking    Patient Stated Goals  improve balance and stamina    Currently in Pain?  No/denies                       [facility] Adult PT Treatment/Exercise - 12/27/18 0001      Knee/Hip Exercises: Aerobic   Nustep  L3 x5 min, PT present to discuss progress       Knee/Hip Exercises: Standing   Hip Abduction  Stengthening;Right;Left;2 sets;5 reps    Abduction Limitations  yellow TB around feet, side stepping           Balance Exercises - 12/27/18 1454      Balance Exercises: Standing   Standing Eyes Opened   Narrow base of support (BOS);Foam/compliant surface;2 reps;30 secs    Standing Eyes Closed  Narrow base of support (BOS);Foam/compliant surface;2 reps;30 secs    Tandem Stance  Eyes open;Foam/compliant surface;2 reps;30 secs    Other Standing Exercises  Standing foot tap onto curb without AD x5 reps each side; Lt step up onto curb with SPC in Lt hand x3 reps (CGA)         PT Education - 12/27/18 1512    Education Details  decrease to 5 reps with sidestepping    Person(s) Educated  Patient    Methods  Explanation;Handout    Comprehension  Verbalized understanding;Returned demonstration       PT Short Term Goals - 12/20/18 1428      PT SHORT TERM GOAL #1   Title  be independent in initial HEP    Time  4    Period  Weeks    Status  Achieved    Target Date  10/12/18      PT SHORT TERM GOAL #2   Title  Pt will be able to ambulate with improved foot  clearance and step length to decrease risk of falling throughout the day.    Baseline  pt has removed throw rugs and feels that she is less likely to fall with this, still noting decreased Rt foot clearance    Time  4    Period  Weeks    Status  Partially Met        PT Long Term Goals - 12/20/18 1429      PT LONG TERM GOAL #1   Title  Pt will demo improved LE strength to atleast 4/5 MMT which will improve her mechanics with daily activity.     Baseline  progress towards this, still hip abduction 3/5 MMT    Time  8    Period  Weeks    Status  Partially Met      PT LONG TERM GOAL #2   Title  Pt will demonstrate atleast 130f increase in her 6 MWT distance, to reflect improvements in her endurance.    Baseline  1742fimprovement without rest break    Time  8    Period  Weeks    Status  On-going      PT LONG TERM GOAL #3   Title  Pt will demo atleast 6 point increase in her BERG balance score to reflect improvements in balance and decrease risk of falls.     Baseline  6 points    Time  8    Period  Weeks    Status  On-going       PT LONG TERM GOAL #4   Title  Pt will be able to carry up to 10 lb object around the clinic x10013fithout noted unsteadiness or LOB.     Time  6    Period  Weeks    Status  New      PT LONG TERM GOAL #5   Title  Pt will be able to get up from the floor safely and without the need for cuing from PT, 2/3 trials, to allow her to get up from the ground after gardening.     Baseline  pt able to do this with UE support through coffee table    Time  8    Period  Weeks    Status  Achieved      Additional Long Term Goals   Additional Long Term Goals  Yes      PT LONG TERM GOAL #6   Title  Pt will report atleast 40% improvement in her ability to work in her garden from the start of PT.    Baseline  25% improved    Time  6    Period  Weeks    Status  Partially Met      PT LONG TERM GOAL #7   Title  Pt will demo improved single leg proprioception and strength, evident by her ability to maintain SLS for 5 sec 2/3 trials.    Time  6    Period  Weeks    Status  New            Plan - 12/27/18 1459    Clinical Impression Statement  Pt struggled with sit to stand over the weekend, completing more than requested on her HEP. Therapist encouraged her to complete within the 5-10 repetitions as instructed on her program. Session focused on increasing proprioception on unstable surfaces. Pt had more difficulty with eyes closed, needing therapist CGA to prevent LOB. Pt had difficulty with curb  transitions as well, with a near LOB while attempting to step up with the left.    Rehab Potential  Good    PT Frequency  1x / week    PT Duration  6 weeks    PT Treatment/Interventions  ADLs/Self Care Home Management;Moist Heat;Gait training;Stair training;Functional mobility training;Therapeutic activities;Therapeutic exercise;Neuromuscular re-education;Balance training;Patient/family education;Manual techniques;Passive range of motion;Dry needling;Taping    PT Next Visit Plan  step up activity,  single leg with leg on step; proprioception    PT Home Exercise Plan  Access Code: [email or web address]     Consulted and Agree with Plan of Care  Patient       Patient will benefit from skilled therapeutic intervention in order to improve the following deficits and impairments:  Abnormal gait, Decreased mobility, Decreased endurance, Decreased strength, Improper body mechanics, Pain, Postural dysfunction, Impaired flexibility, Increased muscle spasms, Difficulty walking  Visit Diagnosis: Unsteadiness on feet  History of falling  Muscle weakness (generalized)  Other disturbances of skin sensation  Other lack of coordination     Problem List There are no active problems to display for this patient.   3:19 PM,12/27/18 Sherol Dade PT, DPT [place] [facility] [address], [place], [place], [address] Phone: [phone]   Fax:  [phone]  Name: Jacqueline Nash MRN: [id number] Date of Birth: 02/21/1941

## 2018-12-29 ENCOUNTER — Ambulatory Visit: Payer: Medicare Other | Admitting: Physical Therapy

## 2018-12-29 ENCOUNTER — Encounter: Payer: Self-pay | Admitting: Physical Therapy

## 2018-12-29 ENCOUNTER — Other Ambulatory Visit: Payer: Self-pay

## 2018-12-29 DIAGNOSIS — Z9181 History of falling: Secondary | ICD-10-CM

## 2018-12-29 DIAGNOSIS — R2681 Unsteadiness on feet: Secondary | ICD-10-CM | POA: Diagnosis not present

## 2018-12-29 DIAGNOSIS — R208 Other disturbances of skin sensation: Secondary | ICD-10-CM

## 2018-12-29 DIAGNOSIS — M6281 Muscle weakness (generalized): Secondary | ICD-10-CM

## 2018-12-29 NOTE — Therapy (Signed)
[facility] [address] [place], [place], [address] Phone: [phone]   Fax:  [phone]  Physical Therapy Treatment  Patient Details  Name: Jacqueline Nash MRN: [id number] Date of Birth: 1940-05-01 Referring Provider (PT): Marlou Porch, MD    Encounter Date: 12/29/2018  PT End of Session - 12/29/18 1437    Visit Number  23    Date for PT Re-Evaluation  02/03/19    Authorization Type  Medicare A and B     Authorization Time Period  NEW: 12/24/18 to 02/03/19    Authorization - Visit Number  22    Authorization - Number of Visits  30    PT Start Time  1410    PT Stop Time  1450    PT Time Calculation (min)  40 min    Activity Tolerance  Patient tolerated treatment well;No increased pain    Behavior During Therapy  WFL for tasks assessed/performed       Past Medical History:  Diagnosis Date  . Cancer ([place])   . Depression   . Hyperlipidemia   . Hypothyroid   . Insomnia   . Migraine   . Osteoporosis   . Peripheral neuropathy   . Scoliosis   . Thyroid cancer ([place]) 1970  . White coat hypertension     History reviewed. No pertinent surgical history.  There were no vitals filed for this visit.  Subjective Assessment - 12/29/18 1410    Subjective  Pt states that her knees are not bothering her currently.    Limitations  Standing;Walking    Patient Stated Goals  improve balance and stamina    Currently in Pain?  No/denies                       [facility] Adult PT Treatment/Exercise - 12/29/18 0001      Exercises   Other Exercises   seated abdominal brace: adductor squeeze with upright posture 10x5 sec, BUE press into foam roll with abdominal brace 15x5 sec hold, abdominal brace with hip flexion 2x5 reps each      Knee/Hip Exercises: Standing   Other Standing Knee Exercises  single leg foot on step with hip hike x5 reps and BUE support; hip hike with foot on step 2x5 sec each; 3 steps  forward/back holding green TB     Other Standing Knee Exercises  partial tandem stance with RNT green band pull Lt, decreased to red TB 5x10 sec (+ trunk rotation noted which improved with mirror feedback)           Balance Exercises - 12/29/18 1420      Balance Exercises: Standing   Standing, One Foot on a Step  Eyes open;6 inch;2 reps;10 secs          PT Short Term Goals - 12/20/18 1428      PT SHORT TERM GOAL #1   Title  be independent in initial HEP    Time  4    Period  Weeks    Status  Achieved    Target Date  10/12/18      PT SHORT TERM GOAL #2   Title  Pt will be able to ambulate with improved foot clearance and step length to decrease risk of falling throughout the day.    Baseline  pt has removed throw rugs and feels that she is less likely to fall with this, still noting decreased Rt foot clearance  Time  4    Period  Weeks    Status  Partially Met        PT Long Term Goals - 12/20/18 1429      PT LONG TERM GOAL #1   Title  Pt will demo improved LE strength to atleast 4/5 MMT which will improve her mechanics with daily activity.     Baseline  progress towards this, still hip abduction 3/5 MMT    Time  8    Period  Weeks    Status  Partially Met      PT LONG TERM GOAL #2   Title  Pt will demonstrate atleast 133f increase in her 6 MWT distance, to reflect improvements in her endurance.    Baseline  1798fimprovement without rest break    Time  8    Period  Weeks    Status  On-going      PT LONG TERM GOAL #3   Title  Pt will demo atleast 6 point increase in her BERG balance score to reflect improvements in balance and decrease risk of falls.     Baseline  6 points    Time  8    Period  Weeks    Status  On-going      PT LONG TERM GOAL #4   Title  Pt will be able to carry up to 10 lb object around the clinic x10073fithout noted unsteadiness or LOB.     Time  6    Period  Weeks    Status  New      PT LONG TERM GOAL #5   Title  Pt will be  able to get up from the floor safely and without the need for cuing from PT, 2/3 trials, to allow her to get up from the ground after gardening.     Baseline  pt able to do this with UE support through coffee table    Time  8    Period  Weeks    Status  Achieved      Additional Long Term Goals   Additional Long Term Goals  Yes      PT LONG TERM GOAL #6   Title  Pt will report atleast 40% improvement in her ability to work in her garden from the start of PT.    Baseline  25% improved    Time  6    Period  Weeks    Status  Partially Met      PT LONG TERM GOAL #7   Title  Pt will demo improved single leg proprioception and strength, evident by her ability to maintain SLS for 5 sec 2/3 trials.    Time  6    Period  Weeks    Status  New            Plan - 12/29/18 1454    Clinical Impression Statement  Today focused on therex to promote hip strength and activation in closed chain positions. Pt had difficulty with Lt glute medius activation on her own but this was improved with theraband neuromuscular feedback. Pt grew fatigued with standing exercises, so session ended with seated abdominal strengthening. She had improved posture correction with this compared to previous sessions, without posterior lean. Will continue with current POC.    Rehab Potential  Good    PT Frequency  1x / week    PT Duration  6 weeks    PT Treatment/Interventions  ADLs/Self Care Home  Management;Moist Heat;Gait training;Stair training;Functional mobility training;Therapeutic activities;Therapeutic exercise;Neuromuscular re-education;Balance training;Patient/family education;Manual techniques;Passive range of motion;Dry needling;Taping    PT Next Visit Plan  step up activity, single leg with leg on step; proprioception    PT Home Exercise Plan  Access Code: [email or web address]     Consulted and Agree with Plan of Care  Patient       Patient will benefit from skilled therapeutic intervention in order to improve the  following deficits and impairments:  Abnormal gait, Decreased mobility, Decreased endurance, Decreased strength, Improper body mechanics, Pain, Postural dysfunction, Impaired flexibility, Increased muscle spasms, Difficulty walking  Visit Diagnosis: Unsteadiness on feet  History of falling  Muscle weakness (generalized)  Other disturbances of skin sensation     Problem List There are no active problems to display for this patient.   4:39 PM,12/29/18 [place], [place] [facility] [place]  [facility] [address] [place], [place], [address] Phone: [phone]   Fax:  [phone]  Name: Jacqueline Nash MRN: [id number] Date of Birth: 08/15/1940

## 2019-01-03 ENCOUNTER — Other Ambulatory Visit: Payer: Self-pay

## 2019-01-03 ENCOUNTER — Ambulatory Visit: Payer: Medicare Other | Admitting: Physical Therapy

## 2019-01-03 ENCOUNTER — Encounter: Payer: Self-pay | Admitting: Physical Therapy

## 2019-01-03 DIAGNOSIS — M6281 Muscle weakness (generalized): Secondary | ICD-10-CM

## 2019-01-03 DIAGNOSIS — R2681 Unsteadiness on feet: Secondary | ICD-10-CM

## 2019-01-03 DIAGNOSIS — Z9181 History of falling: Secondary | ICD-10-CM

## 2019-01-03 DIAGNOSIS — R278 Other lack of coordination: Secondary | ICD-10-CM

## 2019-01-03 DIAGNOSIS — R208 Other disturbances of skin sensation: Secondary | ICD-10-CM

## 2019-01-03 NOTE — Therapy (Signed)
[facility] [address], [place], [place], [address] Phone: [phone]   Fax:  [phone]  Physical Therapy Treatment  Patient Details  Name: Jacqueline Nash MRN: [id number] Date of Birth: [birth year] Referring Provider (PT): Marlou Porch, MD    Encounter Date: 01/03/2019  PT End of Session - 01/03/19 1452    Visit Number  24    Date for PT Re-Evaluation  02/03/19    Authorization Type  Medicare A and B     Authorization Time Period  NEW: 12/24/18 to 02/03/19    Authorization - Visit Number  23    Authorization - Number of Visits  30    PT Start Time  9323    PT Stop Time  1500    PT Time Calculation (min)  42 min    Activity Tolerance  Patient tolerated treatment well;No increased pain    Behavior During Therapy  WFL for tasks assessed/performed       Past Medical History:  Diagnosis Date  . Cancer ([place])   . Depression   . Hyperlipidemia   . Hypothyroid   . Insomnia   . Migraine   . Osteoporosis   . Peripheral neuropathy   . Scoliosis   . Thyroid cancer ([place]) 1970  . White coat hypertension     History reviewed. No pertinent surgical history.  There were no vitals filed for this visit.  Subjective Assessment - 01/03/19 1421    Subjective  Pt states things are going well. No issues currently.    Limitations  Standing;Walking    Patient Stated Goals  improve balance and stamina    Currently in Pain?  No/denies               [facility] Adult PT Treatment/Exercise - 01/03/19 0001      Knee/Hip Exercises: Machines for Strengthening   Total Gym Leg Press  Seat 7: BLE #75 2x15; BLE calf press #10 attempting x10 reps but pt unable to complete       Knee/Hip Exercises: Seated   Other Seated Knee/Hip Exercises  seated BUE press into foam roll with abdominal brace 10x5 sec hold     Other Seated Knee/Hip Exercises  seated abdominal brace with hip flexion x15 reps each           Balance  Exercises - 01/03/19 1441      Balance Exercises: Standing   Standing, One Foot on a Step  Eyes open;Foam/compliant surface;6 inch;5 reps;10 secs    Rockerboard  Anterior/posterior   2x60 sec    Step Ups  Forward;6 inch;UE support 1   2x10 reps each side       PT Education - 01/03/19 1622    Education Details  technique with therex    Person(s) Educated  Patient    Methods  Explanation;Verbal cues;Tactile cues    Comprehension  Verbalized understanding;Returned demonstration       PT Short Term Goals - 12/20/18 1428      PT SHORT TERM GOAL #1   Title  be independent in initial HEP    Time  4    Period  Weeks    Status  Achieved    Target Date  10/12/18      PT SHORT TERM GOAL #2   Title  Pt will be able to ambulate with improved foot clearance and step length to decrease risk of falling throughout the day.    Baseline  pt has removed  throw rugs and feels that she is less likely to fall with this, still noting decreased Rt foot clearance    Time  4    Period  Weeks    Status  Partially Met        PT Long Term Goals - 12/20/18 1429      PT LONG TERM GOAL #1   Title  Pt will demo improved LE strength to atleast 4/5 MMT which will improve her mechanics with daily activity.     Baseline  progress towards this, still hip abduction 3/5 MMT    Time  8    Period  Weeks    Status  Partially Met      PT LONG TERM GOAL #2   Title  Pt will demonstrate atleast 143f increase in her 6 MWT distance, to reflect improvements in her endurance.    Baseline  1760fimprovement without rest break    Time  8    Period  Weeks    Status  On-going      PT LONG TERM GOAL #3   Title  Pt will demo atleast 6 point increase in her BERG balance score to reflect improvements in balance and decrease risk of falls.     Baseline  6 points    Time  8    Period  Weeks    Status  On-going      PT LONG TERM GOAL #4   Title  Pt will be able to carry up to 10 lb object around the clinic x10028fwithout noted unsteadiness or LOB.     Time  6    Period  Weeks    Status  New      PT LONG TERM GOAL #5   Title  Pt will be able to get up from the floor safely and without the need for cuing from PT, 2/3 trials, to allow her to get up from the ground after gardening.     Baseline  pt able to do this with UE support through coffee table    Time  8    Period  Weeks    Status  Achieved      Additional Long Term Goals   Additional Long Term Goals  Yes      PT LONG TERM GOAL #6   Title  Pt will report atleast 40% improvement in her ability to work in her garden from the start of PT.    Baseline  25% improved    Time  6    Period  Weeks    Status  Partially Met      PT LONG TERM GOAL #7   Title  Pt will demo improved single leg proprioception and strength, evident by her ability to maintain SLS for 5 sec 2/3 trials.    Time  6    Period  Weeks    Status  New            Plan - 01/03/19 1718    Clinical Impression Statement  Pt had no complaints upon arrival. She was able to demonstrate improved Lt glute med activation when standing with foot on step without cuing from the therapist. She has also increased her awareness of this at home. Pt struggles with closed chain ankle dorsiflexion and plantarflexion and therapist attempted to address this with platform resistance on the leg press machine. She was unable to coordinate the movement despite several attempts and feedback. Will continue with current  POC to address strength and proprioception limitations.    Rehab Potential  Good    PT Frequency  1x / week    PT Duration  6 weeks    PT Treatment/Interventions  ADLs/Self Care Home Management;Moist Heat;Gait training;Stair training;Functional mobility training;Therapeutic activities;Therapeutic exercise;Neuromuscular re-education;Balance training;Patient/family education;Manual techniques;Passive range of motion;Dry needling;Taping    PT Next Visit Plan  step up activity, single leg  with leg on step; proprioception    PT Home Exercise Plan  Access Code: [email or web address]     Consulted and Agree with Plan of Care  Patient       Patient will benefit from skilled therapeutic intervention in order to improve the following deficits and impairments:  Abnormal gait, Decreased mobility, Decreased endurance, Decreased strength, Improper body mechanics, Pain, Postural dysfunction, Impaired flexibility, Increased muscle spasms, Difficulty walking  Visit Diagnosis: Unsteadiness on feet  History of falling  Muscle weakness (generalized)  Other disturbances of skin sensation  Other lack of coordination     Problem List There are no active problems to display for this patient.   5:28 PM,01/03/19 Sherol Dade PT, DPT [place] at [place]   [facility] [address] [place], [place], [address] Phone: [phone]   Fax:  [phone]  Name: Jacqueline Nash MRN: [id number] Date of Birth: 02-Feb-1941

## 2019-01-05 ENCOUNTER — Ambulatory Visit: Payer: Medicare Other | Admitting: Physical Therapy

## 2019-01-05 ENCOUNTER — Telehealth: Payer: Self-pay | Admitting: Physical Therapy

## 2019-01-05 NOTE — Telephone Encounter (Signed)
No show. Unable to get a hold of pt. Left voicemail to return call when able.   12:16 PM,01/05/19 [place], [place] at [place]

## 2019-01-10 ENCOUNTER — Ambulatory Visit: Payer: Medicare Other | Admitting: Physical Therapy

## 2019-01-24 ENCOUNTER — Ambulatory Visit: Payer: Medicare Other | Attending: Family Medicine | Admitting: Physical Therapy

## 2019-01-24 ENCOUNTER — Encounter: Payer: Self-pay | Admitting: Physical Therapy

## 2019-01-24 ENCOUNTER — Other Ambulatory Visit: Payer: Self-pay

## 2019-01-24 DIAGNOSIS — R209 Unspecified disturbances of skin sensation: Secondary | ICD-10-CM | POA: Insufficient documentation

## 2019-01-24 DIAGNOSIS — R2681 Unsteadiness on feet: Secondary | ICD-10-CM | POA: Diagnosis present

## 2019-01-24 DIAGNOSIS — R208 Other disturbances of skin sensation: Secondary | ICD-10-CM

## 2019-01-24 DIAGNOSIS — R278 Other lack of coordination: Secondary | ICD-10-CM | POA: Diagnosis present

## 2019-01-24 DIAGNOSIS — M6281 Muscle weakness (generalized): Secondary | ICD-10-CM | POA: Diagnosis present

## 2019-01-24 DIAGNOSIS — Z9181 History of falling: Secondary | ICD-10-CM | POA: Diagnosis present

## 2019-01-24 NOTE — Therapy (Signed)
[facility] [address], [place], [place], [address] Phone: [phone]   Fax:  [phone]  Physical Therapy Treatment  Patient Details  Name: Jacqueline Nash MRN: [id number] Date of Birth: 20-Dec-1940 Referring Provider (PT): Marlou Porch, MD    Encounter Date: 01/24/2019  PT End of Session - 01/24/19 1448    Visit Number  25    Date for PT Re-Evaluation  02/03/19    Authorization Type  Medicare A and B     Authorization Time Period  NEW: 12/24/18 to 02/03/19    Authorization - Visit Number  24    Authorization - Number of Visits  30    PT Start Time  1401    PT Stop Time  3419    PT Time Calculation (min)  41 min    Activity Tolerance  Patient tolerated treatment well;No increased pain    Behavior During Therapy  WFL for tasks assessed/performed       Past Medical History:  Diagnosis Date  . Cancer ([place])   . Depression   . Hyperlipidemia   . Hypothyroid   . Insomnia   . Migraine   . Osteoporosis   . Peripheral neuropathy   . Scoliosis   . Thyroid cancer ([place]) 1970  . White coat hypertension     History reviewed. No pertinent surgical history.  There were no vitals filed for this visit.  Subjective Assessment - 01/24/19 1401    Subjective  Pt states that things are going well. She was sick last week and was not able to do her HEP until the last 4-5 days. She could tell she had taken a break but no other issues.    Limitations  Standing;Walking    Patient Stated Goals  improve balance and stamina    Currently in Pain?  No/denies                       [facility] Adult PT Treatment/Exercise - 01/24/19 0001      Knee/Hip Exercises: Machines for Strengthening   Total Gym Leg Press  seat 7: single leg #40, out fast/in slow 2x15 reps       Knee/Hip Exercises: Standing   Other Standing Knee Exercises  single leg foot on step with lift off x5 sec 1 UE support needed (+) trendelenburg  on Lt greater than Rt     Other Standing Knee Exercises  partial tandem 2x15 sec eyes closed, CGA      Knee/Hip Exercises: Seated   Other Seated Knee/Hip Exercises  abdominal brace with LAQ seated on dyna disc x20 reps     Other Seated Knee/Hip Exercises  pelvic tilts with mirror feedback x20 reps           Balance Exercises - 01/24/19 1426      Balance Exercises: Standing   Other Standing Exercises  sidestepping over pool noodle 2x2 trials with CGA        PT Education - 01/24/19 1447    Education Details  technique with therex    Person(s) Educated  Patient    Methods  Explanation;Verbal cues    Comprehension  Verbalized understanding       PT Short Term Goals - 12/20/18 1428      PT SHORT TERM GOAL #1   Title  be independent in initial HEP    Time  4    Period  Weeks    Status  Achieved  Target Date  10/12/18      PT SHORT TERM GOAL #2   Title  Pt will be able to ambulate with improved foot clearance and step length to decrease risk of falling throughout the day.    Baseline  pt has removed throw rugs and feels that she is less likely to fall with this, still noting decreased Rt foot clearance    Time  4    Period  Weeks    Status  Partially Met        PT Long Term Goals - 12/20/18 1429      PT LONG TERM GOAL #1   Title  Pt will demo improved LE strength to atleast 4/5 MMT which will improve her mechanics with daily activity.     Baseline  progress towards this, still hip abduction 3/5 MMT    Time  8    Period  Weeks    Status  Partially Met      PT LONG TERM GOAL #2   Title  Pt will demonstrate atleast 166f increase in her 6 MWT distance, to reflect improvements in her endurance.    Baseline  1715fimprovement without rest break    Time  8    Period  Weeks    Status  On-going      PT LONG TERM GOAL #3   Title  Pt will demo atleast 6 point increase in her BERG balance score to reflect improvements in balance and decrease risk of falls.      Baseline  6 points    Time  8    Period  Weeks    Status  On-going      PT LONG TERM GOAL #4   Title  Pt will be able to carry up to 10 lb object around the clinic x10044fithout noted unsteadiness or LOB.     Time  6    Period  Weeks    Status  New      PT LONG TERM GOAL #5   Title  Pt will be able to get up from the floor safely and without the need for cuing from PT, 2/3 trials, to allow her to get up from the ground after gardening.     Baseline  pt able to do this with UE support through coffee table    Time  8    Period  Weeks    Status  Achieved      Additional Long Term Goals   Additional Long Term Goals  Yes      PT LONG TERM GOAL #6   Title  Pt will report atleast 40% improvement in her ability to work in her garden from the start of PT.    Baseline  25% improved    Time  6    Period  Weeks    Status  Partially Met      PT LONG TERM GOAL #7   Title  Pt will demo improved single leg proprioception and strength, evident by her ability to maintain SLS for 5 sec 2/3 trials.    Time  6    Period  Weeks    Status  New            Plan - 01/24/19 1452    Clinical Impression Statement  Pt arrived with generalized fatigue/weakness after a week of sickness and inability to complete her HEP. She was able to complete all of today's exercises with intermittent rest breaks  but no significant fatigue or struggle. Pt had improved seated posture on unstable surface with mirror feedback, and she was able to complete seated pelvic tilts with therapist cuing. Pt plans to continue with HEP moving forward and will plan to complete re-evaluation at next visit to determine progress towards goals.    Rehab Potential  Good    PT Frequency  1x / week    PT Duration  6 weeks    PT Treatment/Interventions  ADLs/Self Care Home Management;Moist Heat;Gait training;Stair training;Functional mobility training;Therapeutic activities;Therapeutic exercise;Neuromuscular re-education;Balance  training;Patient/family education;Manual techniques;Passive range of motion;Dry needling;Taping    PT Next Visit Plan  re-evaluation    PT Home Exercise Plan  Access Code: [email or web address]     Consulted and Agree with Plan of Care  Patient       Patient will benefit from skilled therapeutic intervention in order to improve the following deficits and impairments:  Abnormal gait, Decreased mobility, Decreased endurance, Decreased strength, Improper body mechanics, Pain, Postural dysfunction, Impaired flexibility, Increased muscle spasms, Difficulty walking  Visit Diagnosis: Unsteadiness on feet  History of falling  Muscle weakness (generalized)  Other disturbances of skin sensation     Problem List There are no active problems to display for this patient.   3:21 PM,01/24/19 Sherol Dade PT, DPT [place] at [place] [address], [place], [place], [address] Phone: [phone]   Fax:  [phone]  Name: Cieanna Stormes MRN: [id number] Date of Birth: [birth year]

## 2019-01-31 ENCOUNTER — Ambulatory Visit: Payer: Medicare Other | Admitting: Physical Therapy

## 2019-01-31 ENCOUNTER — Other Ambulatory Visit: Payer: Self-pay

## 2019-01-31 ENCOUNTER — Encounter: Payer: Self-pay | Admitting: Physical Therapy

## 2019-01-31 DIAGNOSIS — R278 Other lack of coordination: Secondary | ICD-10-CM

## 2019-01-31 DIAGNOSIS — M6281 Muscle weakness (generalized): Secondary | ICD-10-CM

## 2019-01-31 DIAGNOSIS — R208 Other disturbances of skin sensation: Secondary | ICD-10-CM

## 2019-01-31 DIAGNOSIS — R2681 Unsteadiness on feet: Secondary | ICD-10-CM

## 2019-01-31 DIAGNOSIS — Z9181 History of falling: Secondary | ICD-10-CM

## 2019-01-31 NOTE — Therapy (Signed)
[facility] [address] [place], [place], [address] Phone: [phone]   Fax:  [phone]  Physical Therapy Treatment/Discharge  Patient Details  Name: Jacqueline Nash MRN: [id number] Date of Birth: Oct 04, 1940 Referring Provider (PT): Marlou Porch, MD    Encounter Date: 01/31/2019  PT End of Session - 01/31/19 1438    Visit Number  26    Date for PT Re-Evaluation  02/03/19    Authorization Type  Medicare A and B     Authorization Time Period  NEW: 12/24/18 to 02/03/19    Authorization - Visit Number  25    Authorization - Number of Visits  30    PT Start Time  1401    PT Stop Time  4166    PT Time Calculation (min)  44 min    Activity Tolerance  Patient tolerated treatment well;No increased pain    Behavior During Therapy  WFL for tasks assessed/performed       Past Medical History:  Diagnosis Date  . Cancer ([place])   . Depression   . Hyperlipidemia   . Hypothyroid   . Insomnia   . Migraine   . Osteoporosis   . Peripheral neuropathy   . Scoliosis   . Thyroid cancer ([place]) 1970  . White coat hypertension     History reviewed. No pertinent surgical history.  There were no vitals filed for this visit.  Subjective Assessment - 01/31/19 1404    Subjective  Pt states that things have been fine. She was having some upper trap discomfort with some of her exercises.    Limitations  Standing;Walking    Patient Stated Goals  improve balance and stamina    Currently in Pain?  No/denies         [facility] PT Assessment - 01/31/19 0001      Strength   Right Hip Flexion  4+/5    Right Hip Extension  4+/5    Right Hip ABduction  3+/5    Left Hip Flexion  4+/5    Left Hip Extension  4+/5    Left Hip ABduction  3+/5    Right Knee Extension  5/5    Left Knee Extension  5/5    Right Ankle Dorsiflexion  5/5    Right Ankle Inversion  5/5    Right Ankle Eversion  5/5    Left Ankle Dorsiflexion  5/5    Left  Ankle Inversion  5/5    Left Ankle Eversion  4+/5      6 minute walk test results    Endurance additional comments  6 MWT with SPC 480 ft       Berg Balance Test   Sit to Stand  Able to stand without using hands and stabilize independently    Standing Unsupported  Able to stand safely 2 minutes    Sitting with Back Unsupported but Feet Supported on Floor or Stool  Able to sit safely and securely 2 minutes    Stand to Sit  Sits safely with minimal use of hands    Transfers  Able to transfer safely, minor use of hands    Standing Unsupported with Eyes Closed  Able to stand 10 seconds safely    Standing Unsupported with Feet Together  Able to place feet together independently and stand 1 minute safely    From Standing, Reach Forward with Outstretched Arm  Can reach confidently >25 cm (10")    From Standing Position,  Pick up Object from [place] to pick up shoe safely and easily    From Standing Position, Turn to Look Behind Over each Shoulder  Looks behind from both sides and weight shifts well    Turn 360 Degrees  Able to turn 360 degrees safely but slowly    Standing Unsupported, Alternately Place Feet on Step/Stool  Able to stand independently and safely and complete 8 steps in 20 seconds    Standing Unsupported, One Foot in [place] to plae foot ahead of the other independently and hold 30 seconds    Standing on One Leg  Tries to lift leg/unable to hold 3 seconds but remains standing independently    Total Score  50                   [facility] Adult PT Treatment/Exercise - 01/31/19 0001      Manual Therapy   Manual therapy comments  STM Lt upper trap and self massage techniques for home              PT Education - 01/31/19 1501    Education Details  adjustments to HEP; re-evaluation findings/discharge    Person(s) Educated  Patient    Methods  Explanation;Handout    Comprehension  Verbalized understanding;Returned demonstration       PT Short Term Goals -  01/31/19 1422      PT SHORT TERM GOAL #1   Title  be independent in initial HEP    Time  4    Period  Weeks    Status  Achieved    Target Date  10/12/18      PT SHORT TERM GOAL #2   Title  Pt will be able to ambulate with improved foot clearance and step length to decrease risk of falling throughout the day.    Baseline  pt has removed throw rugs and feels that she is less likely to fall with this, still noting decreased Rt foot clearance    Time  4    Period  Weeks    Status  Partially Met        PT Long Term Goals - 01/31/19 1426      PT LONG TERM GOAL #1   Title  Pt will demo improved LE strength to atleast 4/5 MMT which will improve her mechanics with daily activity.     Baseline  progress towards this, still hip abduction 3/5 MMT    Time  8    Period  Weeks    Status  Partially Met      PT LONG TERM GOAL #2   Title  Pt will demonstrate atleast 149f increase in her 6 MWT distance, to reflect improvements in her endurance.    Baseline  1737fimprovement without rest break; 48068fith SPC    Time  8    Period  Weeks    Status  Partially Met      PT LONG TERM GOAL #3   Title  Pt will demo atleast 6 point increase in her BERG balance score to reflect improvements in balance and decrease risk of falls.     Baseline  6 points    Time  8    Period  Weeks    Status  On-going      PT LONG TERM GOAL #4   Title  Pt will be able to carry up to 10 lb object around the clinic x100f40fthout noted unsteadiness or  LOB.     Time  6    Period  Weeks    Status  Achieved      PT LONG TERM GOAL #5   Title  Pt will be able to get up from the floor safely and without the need for cuing from PT, 2/3 trials, to allow her to get up from the ground after gardening.     Baseline  pt able to do this with UE support through coffee table    Time  8    Period  Weeks    Status  Achieved      PT LONG TERM GOAL #6   Title  Pt will report atleast 40% improvement in her ability to work in her  garden from the start of PT.    Baseline  unable to assess, goals have changed and she can do all seeding, pulling weeds and get up from the ground when holding onto things    Time  6    Period  Weeks    Status  Achieved      PT LONG TERM GOAL #7   Title  Pt will demo improved single leg proprioception and strength, evident by her ability to maintain SLS for 5 sec 2/3 trials.    Baseline  SLS: 1-2 sec with supervision    Time  6    Period  Weeks    Status  Not Met            Plan - 01/31/19 1450    Clinical Impression Statement  Pt was discharged this visit having met her short-term goals as well as several long-term goals since starting PT. Her BERG balance score has increased by atleast 6 points and her 6 MWT improved by 132f at last reassessment. Pt's strength in the LE, although improved since the eval, has recently plateaued. Her endurance and balance have also plateaued over the past several weeks, in part due to recent sickness resulting in 2 weeks of inactivity. Pt feels that she is functionally able to complete more in her garden, including getting up from the ground with UE support. She is agreeable with d/c to allow her more time to work on her HEP.    Rehab Potential  Good    PT Frequency  1x / week    PT Duration  6 weeks    PT Treatment/Interventions  ADLs/Self Care Home Management;Moist Heat;Gait training;Stair training;Functional mobility training;Therapeutic activities;Therapeutic exercise;Neuromuscular re-education;Balance training;Patient/family education;Manual techniques;Passive range of motion;Dry needling;Taping    PT Next Visit Plan  d/c    PT Home Exercise Plan  Access Code: [email or web address]    Consulted and Agree with Plan of Care  Patient       Patient will benefit from skilled therapeutic intervention in order to improve the following deficits and impairments:  Abnormal gait, Decreased mobility, Decreased endurance, Decreased strength, Improper body mechanics,  Pain, Postural dysfunction, Impaired flexibility, Increased muscle spasms, Difficulty walking  Visit Diagnosis: Unsteadiness on feet  History of falling  Muscle weakness (generalized)  Other disturbances of skin sensation  Other lack of coordination  PHYSICAL THERAPY DISCHARGE SUMMARY  Visits from Start of Care: 26 Current functional level related to goals / functional outcomes: See above for more details    Remaining deficits: See above for more details    Education / Equipment: See above for more details   Plan: Patient agrees to discharge.  Patient goals were partially met. Patient is being discharged due to lack  of progress.  ?????   Recent plateau in progress             Problem List There are no active problems to display for this patient.   3:02 PM,01/31/19 Sherol Dade PT, DPT [place] at [place] [address] [place], [place], [address] Phone: [phone]   Fax:  [phone]  Name: Lorraina Spring MRN: [id number] Date of Birth: 05-Mar-1941

## 2019-01-31 NOTE — Patient Instructions (Signed)
Access Code: [email or web address]  URL: [email or web address]  Date: 01/31/2019  Prepared by: Sherol Dade   Exercises  Standing Anti-Rotation Press with Anchored Resistance - 15 reps - 2 sets - 2x daily - 4x weekly  Sit to Stand with Arms Crossed - 5-10 reps - 2x daily - 4x weekly  Side Stepping with Resistance at Feet - 5 reps - 4 sets - 1x daily - 4x weekly  Standing Hip Hiking - 10 reps - 2x daily - 4x weekly  Long Sitting Ankle Plantar Flexion with Resistance - 20 reps - 2 sets - 1x daily - 4x weekly  Standing Single Leg Stance with Unilateral Counter Support - 10 reps - 3 sets - 1x daily - 4x weekly    [facility] [address], [place], [place] [address] Phone # [phone] Fax [phone]

## 2019-12-13 ENCOUNTER — Other Ambulatory Visit: Payer: Self-pay | Admitting: Family Medicine

## 2019-12-13 DIAGNOSIS — Z1231 Encounter for screening mammogram for malignant neoplasm of breast: Secondary | ICD-10-CM

## 2019-12-28 ENCOUNTER — Ambulatory Visit: Payer: Medicare Other

## 2020-01-05 ENCOUNTER — Ambulatory Visit: Payer: Medicare Other

## 2020-01-18 ENCOUNTER — Ambulatory Visit: Payer: Medicare Other

## 2020-01-18 ENCOUNTER — Other Ambulatory Visit: Payer: Self-pay

## 2020-02-12 ENCOUNTER — Encounter: Payer: Self-pay | Admitting: Allergy

## 2020-02-12 ENCOUNTER — Ambulatory Visit (INDEPENDENT_AMBULATORY_CARE_PROVIDER_SITE_OTHER): Payer: Medicare Other | Admitting: Allergy

## 2020-02-12 ENCOUNTER — Other Ambulatory Visit: Payer: Self-pay

## 2020-02-12 VITALS — BP 124/82 | HR 79 | Temp 98.5°F | Resp 16 | Ht 63.0 in | Wt 150.0 lb

## 2020-02-12 DIAGNOSIS — J3089 Other allergic rhinitis: Secondary | ICD-10-CM

## 2020-02-12 DIAGNOSIS — T781XXD Other adverse food reactions, not elsewhere classified, subsequent encounter: Secondary | ICD-10-CM | POA: Diagnosis not present

## 2020-02-12 DIAGNOSIS — Z91038 Other insect allergy status: Secondary | ICD-10-CM

## 2020-02-12 DIAGNOSIS — T781XXA Other adverse food reactions, not elsewhere classified, initial encounter: Secondary | ICD-10-CM | POA: Insufficient documentation

## 2020-02-12 DIAGNOSIS — L230 Allergic contact dermatitis due to metals: Secondary | ICD-10-CM

## 2020-02-12 MED ORDER — EPINEPHRINE 0.3 MG/0.3ML IJ SOAJ: 0.3000 mg | INTRAMUSCULAR | 1 refills | Status: DC | PRN

## 2020-02-12 NOTE — Assessment & Plan Note (Signed)
Questionable allergy to seafood in the past. No recent work up.  Continue to avoid foods that bother you.  Consider re-testing.

## 2020-02-12 NOTE — Patient Instructions (Addendum)
.   Patches placed today. . Please avoid strenuous physical activities and do not get the patches on the back wet. No showering until final patch reading done. Faythe Ghee to take antihistamines for itching but avoid placing any creams on the back where the patches are. . We will remove the patches on Wednesday and will do our initial read. . Then you will come back on Friday for a final read.  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  I have prescribed epinephrine injectable and demonstrated proper use. For mild symptoms you can take over the counter antihistamines such as Benadryl and monitor symptoms closely. If symptoms worsen or if you have severe symptoms including breathing issues, throat closure, significant swelling, whole body hives, severe diarrhea and vomiting, lightheadedness then inject epinephrine and seek immediate medical care afterwards.  Food allergy:  Continue to avoid foods that bother you.  Consider re-testing.  Environmental allergies:  Consider testing in future.  May use over the counter antihistamines such as Zyrtec (cetirizine), Claritin (loratadine), Allegra (fexofenadine), or Xyzal (levocetirizine) daily as needed.

## 2020-02-12 NOTE — Assessment & Plan Note (Signed)
Rhinoconjunctivitis symptoms and takes Allegra with good benefit.  Symptoms flared since off allergy medications.  Consider testing in future.  May use over the counter antihistamines such as Zyrtec (cetirizine), Claritin (loratadine), Allegra (fexofenadine), or Xyzal (levocetirizine) daily as needed.

## 2020-02-12 NOTE — Assessment & Plan Note (Signed)
History of swelling and hives in the past after hymenoptera sting.  Used to have epinephrine device but currently does not have one.  Consider re-testing   Avoid stings.  I have prescribed epinephrine injectable and demonstrated proper use. For mild symptoms you can take over the counter antihistamines such as Benadryl and monitor symptoms closely. If symptoms worsen or if you have severe symptoms including breathing issues, throat closure, significant swelling, whole body hives, severe diarrhea and vomiting, lightheadedness then inject epinephrine and seek immediate medical care afterwards.

## 2020-02-12 NOTE — Assessment & Plan Note (Signed)
Pruritic rash after wearing jewelry that is not made of silver or gold in the past.  Questionable reaction to hardware placed in her foot in the past as well - this was removed.  Planning on having right knee replacement. . Metal patch panel placed today. . Please avoid strenuous physical activities and do not get the patches on the back wet. No showering until final patch reading done. Jacqueline Nash to take antihistamines for itching but avoid placing any creams on the back where the patches are. . We will remove the patches on Wednesday and will do our initial read. . Then you will come back on Friday for a final read. . Will make additional recommendations and send the results to her orthopedic surgeon after the final read on Friday.

## 2020-02-12 NOTE — Progress Notes (Signed)
New Patient Note  RE: Jacqueline Nash MRN: [id number] DOB: 24-Mar-1941 Date of Office Visit: 02/12/2020  Referring provider: Sydnee Cabal, MD Primary care provider: Marlou Porch Given, MD  Chief Complaint: Patch Testing (metals)  History of Present Illness: I had the pleasure of seeing Jacqueline Nash for initial evaluation at the [facility] on 02/12/2020. She is a 79 y.o. female, who is referred here by Dr. Theda Sers (orthopedics) for the evaluation of nickel allergy.  Patient is planning on having a right knee replacement.   She noticed breaking out in rash and itching when not wearing silver or gold jewelry. She also used to break out from her watch when she was working.   Patient had previous toe fusion with some type of hardware with no issues. But then overtime the hardware apparently broke and caused the fusion to break as well.  There was a concern if her nickel allergy may have contributed to this issue. The hardware since then was removed.  Assessment and Plan: Jacqueline Nash is a 79 y.o. 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  Questionable reaction to hardware placed in her foot in the past as well - this was removed.  Planning on having right knee replacement. . Metal patch panel placed today. . Please avoid strenuous physical activities and do not get the patches on the back wet. No showering until final patch reading done. Jacqueline Nash to take antihistamines for itching but avoid placing any creams on the back where the patches are. . We will remove the patches on Wednesday and will do our initial read. . Then you will come back on Friday for a final read. . Will make additional recommendations and send the results to her orthopedic surgeon after the final read on Friday.  Hymenoptera allergy History of swelling and hives in the past after hymenoptera sting.  Used to  have epinephrine device but currently does not have one.  Consider re-testing   Avoid stings.  I have prescribed epinephrine injectable and demonstrated proper use. For mild symptoms you can take over the counter antihistamines such as Benadryl and monitor symptoms closely. If symptoms worsen or if you have severe symptoms including breathing issues, throat closure, significant swelling, whole body hives, severe diarrhea and vomiting, lightheadedness then inject epinephrine and seek immediate medical care afterwards.  Other allergic rhinitis Rhinoconjunctivitis symptoms and takes Allegra with good benefit.  Symptoms flared since off allergy medications.  Consider testing in future.  May use over the counter antihistamines such as Zyrtec (cetirizine), Claritin (loratadine), Allegra (fexofenadine), or Xyzal (levocetirizine) daily as needed.  Adverse food reaction Questionable allergy to seafood in the past. No recent work up.  Continue to avoid foods that bother you.  Consider re-testing.  Return in about 2 days (around 02/14/2020) for Patch reading.  Meds ordered this encounter  Medications  . EPINEPHrine 0.3 mg/0.3 mL IJ SOAJ injection    Sig: Inject 0.3 mg into the muscle as needed for anaphylaxis. As needed for life-threatening allergic reactions    Dispense:  2 each    Refill:  1    Please dispense mylan or teva generic brand   Other allergy screening: Asthma: no Rhino conjunctivitis: yes  Rhino conjunctivitis symptoms and takes allegra with good benefit.  Food allergy: questionable allergy to seafood in the past.  Medication allergy: yes Hymenoptera allergy: yes Swelling and hives in  the past. Used to have epinephrine with her in the past but does not have a current one.  Urticaria: no Eczema: yes as a child.  History of recurrent infections suggestive of immunodeficency: no  Past Medical History: Patient Active Problem List   Diagnosis Date Noted  . 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 Other allergic rhinitis 02/12/2020  . Adverse food reaction 02/12/2020   Past Medical History:  Diagnosis Date  . Cancer ([place])   . Depression   . Hyperlipidemia   . Hypothyroid   . Insomnia   . Migraine   . Osteoporosis   . Peripheral neuropathy   . Scoliosis   . Thyroid cancer ([place]) 1970  . White coat hypertension    Past Surgical History: Past Surgical History:  Procedure Laterality Date  . CHOLECYSTECTOMY    . TONSILLECTOMY     Medication List:  Current Outpatient Medications  Medication Sig Dispense Refill  . cyclobenzaprine (FLEXERIL) 10 MG tablet Take 10 mg by mouth 3 (three) times daily as needed for muscle spasms.    . fexofenadine (ALLEGRA) 180 MG tablet Take 180 mg by mouth daily.    Marland Kitchen levothyroxine (SYNTHROID, LEVOTHROID) 75 MCG tablet Take 75 mcg by mouth daily. Take one tablet every morning on an empty stomach.    Marland Kitchen LORazepam (ATIVAN) 1 MG tablet Take 1 mg by mouth as needed for anxiety. Take 1/2 to 1 tablet as needed.    . meloxicam (MOBIC) 7.5 MG tablet Take 7.5 mg by mouth daily.    . Multiple Vitamins-Minerals (EYE VITAMINS) TABS Take by mouth once.    . pravastatin (PRAVACHOL) 20 MG tablet Take 20 mg by mouth daily.    . pregabalin (LYRICA) 150 MG capsule Take 150 mg by mouth 2 (two) times daily.    . Probiotic Product (PRO-BIOTIC BLEND) CAPS Take by mouth.    . rosuvastatin (CRESTOR) 10 MG tablet Take 10 mg by mouth daily.    . sertraline (ZOLOFT) 100 MG tablet Take 100 mg by mouth daily.    . SUMAtriptan (IMITREX) 50 MG tablet Take 50 mg by mouth every 2 (two) hours as needed for migraine or headache. May repeat in 2 hours if headache persists or recurs.    Marland Kitchen EPINEPHrine 0.3 mg/0.3 mL IJ SOAJ injection Inject 0.3 mg into the muscle as needed for anaphylaxis. As needed for life-threatening allergic reactions 2 each 1   No current facility-administered medications for this visit.    Allergies: Allergies  Allergen Reactions  . Sulfa Antibiotics Hives  . Demerol [Meperidine] Hives   Social History: Social History   Socioeconomic History  . Marital status: Divorced    Spouse name: Not on file  . Number of children: Not on file  . Years of education: Not on file  . Highest education level: Not on file  Occupational History  . Not on file  Tobacco Use  . Smoking status: Former Research scientist (life sciences)  . Smokeless tobacco: Former Network engineer and Sexual Activity  . Alcohol use: Yes  . Drug use: No  . Sexual activity: Not on file  Other Topics Concern  . Not on file  Social History Narrative  . Not on file   Social Determinants of Health   Financial Resource Strain:   . Difficulty of Paying Living Expenses: Not on file  Food Insecurity:   . Worried About Charity fundraiser in the Last Year: Not on file  .  Ran Out of Food in the Last Year: Not on file  Transportation Needs:   . Lack of Transportation (Medical): Not on file  . Lack of Transportation (Non-Medical): Not on file  Physical Activity:   . Days of Exercise per Week: Not on file  . Minutes of Exercise per Session: Not on file  Stress:   . Feeling of Stress : Not on file  Social Connections:   . Frequency of Communication with Friends and Family: Not on file  . Frequency of Social Gatherings with Friends and Family: Not on file  . Attends Religious Services: Not on file  . Active Member of Clubs or Organizations: Not on file  . Attends Archivist Meetings: Not on file  . Marital Status: Not on file   Lives in a house. Smoking: denies, quit in her 13s Occupation: retired.  Environmental History: Water Damage/mildew in the house: no Carpet in the family room: no Carpet in the bedroom: no Heating: heat pump Cooling: heat pump Pet: no  Family History: Family History  Problem Relation Age of Onset  . Asthma Daughter   . Asthma Brother   . Allergic rhinitis Neg Hx   . Angioedema  Neg Hx   . Atopy Neg Hx   . Eczema Neg Hx   . Immunodeficiency Neg Hx   . Urticaria Neg Hx    Review of Systems  Constitutional: Negative for appetite change, chills, fever and unexpected weight change.  HENT: Negative for congestion and rhinorrhea.   Eyes: Negative for itching.  Respiratory: Negative for cough, chest tightness, shortness of breath and wheezing.   Cardiovascular: Negative for chest pain.  Gastrointestinal: Negative for abdominal pain.  Genitourinary: Negative for difficulty urinating.  Skin: Negative for rash.  Neurological: Negative for headaches.   Objective: BP 124/82   Pulse 79   Temp 98.5 F (36.9 C) (Temporal)   Resp 16   Ht 5\' 3"  (1.6 m)   Wt 150 lb (68 kg)   SpO2 98%   BMI 26.57 kg/m  Body mass index is 26.57 kg/m. Physical Exam Vitals and nursing note reviewed.  Constitutional:      Appearance: Normal appearance. She is well-developed.  HENT:     Head: Normocephalic and atraumatic.     Right Ear: External ear normal.     Left Ear: External ear normal.     Nose: Nose normal.     Mouth/Throat:     Mouth: Mucous membranes are moist.     Pharynx: Oropharynx is clear.  Eyes:     Conjunctiva/sclera: Conjunctivae normal.  Cardiovascular:     Rate and Rhythm: Normal rate and regular rhythm.     Heart sounds: Normal heart sounds. No murmur heard.  No friction rub. No gallop.   Pulmonary:     Effort: Pulmonary effort is normal.     Breath sounds: Normal breath sounds. No wheezing, rhonchi or rales.  Abdominal:     Palpations: Abdomen is soft.  Musculoskeletal:     Cervical back: Neck supple.  Skin:    General: Skin is warm.     Findings: No rash.  Neurological:     Mental Status: She is alert and oriented to person, place, and time.  Psychiatric:        Behavior: Behavior normal.    The plan was reviewed with the patient/family, and all questions/concerned were addressed.  It was my pleasure to see Jacqueline Nash today and participate in her  care. Please feel free  to contact me with any questions or concerns.  Sincerely,  Rexene Alberts, DO Allergy & Immunology  [facility] office: [place] office: [phone]

## 2020-02-13 ENCOUNTER — Other Ambulatory Visit: Payer: Self-pay | Admitting: *Deleted

## 2020-02-13 MED ORDER — EPINEPHRINE 0.3 MG/0.3ML IJ SOAJ: 0.3000 mg | INTRAMUSCULAR | 1 refills | Status: AC | PRN

## 2020-02-13 NOTE — Progress Notes (Signed)
   Follow Up Note  RE: Jacqueline Nash MRN: [id number] DOB: 1940-11-25 Date of Office Visit: 02/14/2020   Referring provider: Marlou Porch Given, MD Primary care provider: Marlou Porch Given, MD  History of Present Illness: I had the pleasure of seeing Jacqueline Nash for a follow up visit at the [facility] [place] on 02/14/2020. She is a 79 y.o. female, who is being followed for possible contact dermatitis. Today she is here for initial patch test interpretation, given suspected history of contact dermatitis.   Diagnostics:  Metal patch test 48 hour reading:   Metals Patch - 02/14/20 1400    Location Back    Number of Test 11    Reading Interval Day 3    Aluminum Hydroxide 10% 0    Chromium chloride 1% 0    Cobalt chloride hexahydrate 1% 0    Molybdenum chloride 0.5% 0    Nickel sulfate hexahydrate 5% 0    Potassium dichromate 0.25% 0    Copper sulfate pentahydrate 2% 0    Tantal 1% 0    Titanium 0.1% 0    Manganese chloride 0.5% 0    Vanadium Pentoxide 10% 0          Assessment and Plan: Jacqueline Nash is a 79 y.o. 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  Questionable reaction to hardware placed in her foot in the past as well - this was removed.  Planning on having right knee replacement. Interim history - Initial read 48 hour after placement all negative to metals.   Return in about 2 days (around 02/16/2020) for Patch reading.  It was my pleasure to see Jacqueline Nash today and participate in her care. Please feel free to contact me with any questions or concerns.  Sincerely,  Rexene Alberts, DO Allergy & Immunology  [facility] office: [phone] [facility] office: [place] office: [phone]

## 2020-02-14 ENCOUNTER — Encounter: Payer: Self-pay | Admitting: Allergy

## 2020-02-14 ENCOUNTER — Ambulatory Visit: Payer: Medicare Other | Admitting: Allergy

## 2020-02-14 ENCOUNTER — Other Ambulatory Visit: Payer: Self-pay

## 2020-02-14 DIAGNOSIS — L230 Allergic contact dermatitis due to metals: Secondary | ICD-10-CM

## 2020-02-14 NOTE — Assessment & Plan Note (Addendum)
Past history - Pruritic rash after wearing jewelry that is not made of silver or gold in the past.  Questionable reaction to hardware placed in her foot in the past as well - this was removed.  Planning on having right knee replacement. Interim history - Initial read 48 hour after placement all negative to metals.

## 2020-02-15 ENCOUNTER — Telehealth: Payer: Self-pay | Admitting: *Deleted

## 2020-02-15 ENCOUNTER — Other Ambulatory Visit: Payer: Self-pay | Admitting: *Deleted

## 2020-02-15 MED ORDER — EPINEPHRINE 0.3 MG/0.3ML IJ SOAJ: 0.3000 mg | Freq: Once | INTRAMUSCULAR | 1 refills | Status: AC

## 2020-02-15 NOTE — Telephone Encounter (Signed)
PA has been submitted through CoverMyMeds for EpiPen 0.30mg  and is currently pending approval or denial.

## 2020-02-15 NOTE — Telephone Encounter (Signed)
PA was denied stating that EpiPen 2 pack is preferred by patient's insurance. Sent in new prescription.

## 2020-02-15 NOTE — Telephone Encounter (Signed)
Patient Rx info is [phone] Erlinda Hong, [id number], PCN-[id number], [id number].

## 2020-02-16 ENCOUNTER — Ambulatory Visit: Payer: Medicare Other | Admitting: Allergy

## 2020-02-16 ENCOUNTER — Other Ambulatory Visit: Payer: Self-pay

## 2020-02-16 ENCOUNTER — Encounter: Payer: Self-pay | Admitting: Allergy

## 2020-02-16 DIAGNOSIS — L230 Allergic contact dermatitis due to metals: Secondary | ICD-10-CM

## 2020-02-16 NOTE — Progress Notes (Signed)
   Follow Up Note  RE: Jacqueline Nash MRN: [id number] DOB: [birth year] Date of Office Visit: 02/16/2020  Referring provider: Marlou Porch Given, MD Primary care provider: Marlou Porch Given, MD  History of Present Illness: I had the pleasure of seeing Jacqueline Nash for a follow up visit at the [facility] [place] on 02/16/2020. She is a 79 y.o. female, who is being followed for possible metal contact dermatitis. Today she is here for final patch test interpretation, given suspected history of contact dermatitis.   Diagnostics:  Metal patch test 96 hour reading: All negative.   Metals Patch - 02/16/20 0831    Location Back    Number of Test 11    Reading Interval Day 3;Day 5    Aluminum Hydroxide 10% 0    Chromium chloride 1% 0    Cobalt chloride hexahydrate 1% 0    Molybdenum chloride 0.5% 0    Nickel sulfate hexahydrate 5% 0    Potassium dichromate 0.25% 0    Copper sulfate pentahydrate 2% 0    Tantal 1% 0    Titanium 0.1% 0    Manganese chloride 0.5% 0    Vanadium Pentoxide 10% 0            Assessment and Plan: Jacqueline Nash is a 79 y.o. 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  Questionable reaction to hardware placed in her foot in the past as well - this was removed.  Planning on having right knee replacement. Interim history - 48 hour and 96 hour reading were all negative to metals.   Will send results to Dr. Theda Sers (surgeon).   Return if symptoms worsen or fail to improve.  It was my pleasure to see Jacqueline Nash today and participate in her care. Please feel free to contact me with any questions or concerns.  Sincerely,  Rexene Alberts, DO Allergy & Immunology  [facility] office: [phone] [facility] office: [place] office: [phone]

## 2020-02-16 NOTE — Patient Instructions (Addendum)
I will send the results to your surgeon. Metal patch testing all negative today.   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  I have prescribed epinephrine injectable and demonstrated proper use. For mild symptoms you can take over the counter antihistamines such as Benadryl and monitor symptoms closely. If symptoms worsen or if you have severe symptoms including breathing issues, throat closure, significant swelling, whole body hives, severe diarrhea and vomiting, lightheadedness then inject epinephrine and seek immediate medical care afterwards.  Food allergy:  Continue to avoid foods that bother you.  Consider re-testing.  Environmental allergies:  Consider testing in future.  May use over the counter antihistamines such as Zyrtec (cetirizine), Claritin (loratadine), Allegra (fexofenadine), or Xyzal (levocetirizine) daily as needed.

## 2020-02-16 NOTE — Assessment & Plan Note (Addendum)
Past history - Pruritic rash after wearing jewelry that is not made of silver or gold in the past.  Questionable reaction to hardware placed in her foot in the past as well - this was removed.  Planning on having right knee replacement. Interim history - 48 hour and 96 hour reading were all negative to metals.   Will send results to Dr. Theda Sers (surgeon).

## 2021-06-02 NOTE — Progress Notes (Signed)
Surgery orders requested via Epic inbox. °

## 2021-06-05 ENCOUNTER — Ambulatory Visit: Payer: Self-pay | Admitting: Physician Assistant

## 2021-06-05 DIAGNOSIS — G8929 Other chronic pain: Secondary | ICD-10-CM

## 2021-06-05 DIAGNOSIS — M25552 Pain in left hip: Secondary | ICD-10-CM

## 2021-06-05 NOTE — H&P (View-Only) (Signed)
TOTAL HIP ADMISSION H&P  Patient is admitted for left total hip arthroplasty.  Subjective:  Chief Complaint: left hip pain  HPI: Jacqueline Nash, 81 y.o. female, has a history of pain and functional disability in the left hip(s) due to arthritis and patient has failed non-surgical conservative treatments for greater than 12 weeks to include NSAID's and/or analgesics, use of assistive devices, and activity modification.  Onset of symptoms was gradual starting >10 years ago with gradually worsening course since that time.The patient noted no past surgery on the left hip(s).  Patient currently rates pain in the left hip at 10 out of 10 with activity. Patient has night pain, worsening of pain with activity and weight bearing, pain that interfers with activities of daily living, and pain with passive range of motion. Patient has evidence of subchondral cysts, periarticular osteophytes, joint subluxation, and joint space narrowing by imaging studies. This condition presents safety issues increasing the risk of falls.  There is no current active infection.  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      cholecalciferol (VITAMIN D3) 25 MCG (1000 UNIT) tablet Take 1,000 Units by mouth daily.      dicyclomine (BENTYL) 10 MG capsule Take 10 mg by mouth with  breakfast, with lunch, and with evening meal.      EPINEPHrine 0.3 mg/0.3 mL IJ SOAJ injection Inject 0.3 mg into the muscle as needed for anaphylaxis. As needed for life-threatening allergic reactions 2 each 1    levothyroxine (SYNTHROID, LEVOTHROID) 75 MCG tablet Take 75 mcg by mouth daily before breakfast.      meloxicam (MOBIC) 15 MG tablet Take 15 mg by mouth daily.      mirtazapine (REMERON) 7.5 MG tablet Take 7.5 mg by mouth at bedtime.      montelukast (SINGULAIR) 10 MG tablet Take 10 mg by mouth at bedtime.      omeprazole (PRILOSEC) 10 MG capsule Take 10 mg by mouth daily.      polyethylene glycol (MIRALAX / GLYCOLAX) 17 g packet Take 17 g by mouth daily as needed for moderate constipation.      pregabalin (LYRICA) 150 MG capsule Take 150 mg by mouth 2 (two) times daily.      rosuvastatin (CRESTOR) 20 MG tablet Take 20 mg by mouth at bedtime.      sertraline (ZOLOFT) 100 MG tablet Take 100 mg by mouth daily.      SUMAtriptan (IMITREX) 50 MG tablet Take 50 mg by mouth every 2 (two) hours as needed for migraine or headache. May repeat in 2 hours if headache persists or recurs.      traMADol (ULTRAM) 50 MG tablet Take 50 mg by mouth at bedtime as needed for  moderate pain.      TRIAMCINOLONE ACETONIDE EX Apply 1 application topically daily as needed (eczema).      No current facility-administered medications for this visit.   Allergies  Allergen Reactions   Sulfa Antibiotics Hives   Demerol [Meperidine] Hives   Garlic Diarrhea and Swelling   Gluten Meal Diarrhea and Swelling    Bloating    Onion Diarrhea and Swelling    Social History   Tobacco Use   Smoking status: Former   Smokeless tobacco: Former  Substance Use Topics   Alcohol use: Yes    Family History  Problem Relation Age of Onset   Asthma Daughter    Asthma Brother    Allergic rhinitis Neg Hx    Angioedema Neg Hx    Atopy Neg Hx    Eczema Neg Hx    Immunodeficiency Neg Hx    Urticaria Neg Hx      Review of  Systems  Musculoskeletal:  Positive for arthralgias.  All other systems reviewed and are negative.  Objective:  Physical Exam Constitutional:      General: She is not in acute distress.    Appearance: Normal appearance.  HENT:     Head: Normocephalic and atraumatic.  Eyes:     Extraocular Movements: Extraocular movements intact.     Pupils: Pupils are equal, round, and reactive to light.  Cardiovascular:     Rate and Rhythm: Normal rate and regular rhythm.     Pulses: Normal pulses.     Heart sounds: Normal heart sounds.  Pulmonary:     Effort: Pulmonary effort is normal. No respiratory distress.     Breath sounds: Normal breath sounds.  Abdominal:     General: Abdomen is flat. Bowel sounds are normal. There is no distension.     Palpations: Abdomen is soft.     Tenderness: There is no abdominal tenderness.  Musculoskeletal:     Cervical back: Normal range of motion and neck supple.     Left hip: Tenderness and bony tenderness present. Decreased range of motion. Decreased strength.     Right knee: Swelling and bony tenderness present. No effusion or erythema. Tenderness present. MCL laxity present.  Lymphadenopathy:     Cervical: No cervical adenopathy.  Skin:    General: Skin is warm and dry.     Findings: No erythema or rash.  Neurological:     General: No focal deficit present.     Mental Status: She is alert and oriented to person, place, and time.  Psychiatric:        Mood and Affect: Mood normal.        Behavior: Behavior normal.    Vital signs in last 24 hours: @VSRANGES @  Labs:   Estimated body mass index is 26.57 kg/m as calculated from the following:   Height as of 02/12/20: 5\' 3"  (1.6 m).   Weight as of 02/12/20: 68 kg.   Imaging Review Plain radiographs demonstrate severe degenerative joint disease of the left hip(s). The bone quality appears to be good for age and reported activity level.      Assessment/Plan:  End stage arthritis, left  hip(s)  The patient history, physical examination, clinical judgement of the provider and imaging studies are consistent with end stage degenerative joint disease of the left hip(s) and total hip arthroplasty is deemed medically necessary. The treatment options including medical management, injection therapy, arthroscopy and arthroplasty were discussed at length. The risks and benefits of total hip arthroplasty  were presented and reviewed. The risks due to aseptic loosening, infection, stiffness, dislocation/subluxation,  thromboembolic complications and other imponderables were discussed.  The patient acknowledged the explanation, agreed to proceed with the plan and consent was signed. Patient is being admitted for inpatient treatment for surgery, pain control, PT, OT, prophylactic antibiotics, VTE prophylaxis, progressive ambulation and ADL's and discharge planning.The patient is planning to be discharged  home with outpt PT.  Also discussed her advanced Right knee OA, planning on intraop cortisone injection.   Anticipated LOS equal to or greater than 2 midnights due to - Age 68 and older with one or more of the following:  - Obesity  - Expected need for hospital services (PT, OT, Nursing) required for safe  discharge  - Anticipated need for postoperative skilled nursing care or inpatient rehab  - Active co-morbidities: None OR   - Unanticipated findings during/Post Surgery: None  - Patient is a high risk of re-admission due to: None

## 2021-06-05 NOTE — H&P (Signed)
TOTAL HIP ADMISSION H&P  Patient is admitted for left total hip arthroplasty.  Subjective:  Chief Complaint: left hip pain  HPI: Jacqueline Nash, 81 y.o. female, has a history of pain and functional disability in the left hip(s) due to arthritis and patient has failed non-surgical conservative treatments for greater than 12 weeks to include NSAID's and/or analgesics, use of assistive devices, and activity modification.  Onset of symptoms was gradual starting >10 years ago with gradually worsening course since that time.The patient noted no past surgery on the left hip(s).  Patient currently rates pain in the left hip at 10 out of 10 with activity. Patient has night pain, worsening of pain with activity and weight bearing, pain that interfers with activities of daily living, and pain with passive range of motion. Patient has evidence of subchondral cysts, periarticular osteophytes, joint subluxation, and joint space narrowing by imaging studies. This condition presents safety issues increasing the risk of falls.  There is no current active infection.  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      cholecalciferol (VITAMIN D3) 25 MCG (1000 UNIT) tablet Take 1,000 Units by mouth daily.      dicyclomine (BENTYL) 10 MG capsule Take 10 mg by mouth with  breakfast, with lunch, and with evening meal.      EPINEPHrine 0.3 mg/0.3 mL IJ SOAJ injection Inject 0.3 mg into the muscle as needed for anaphylaxis. As needed for life-threatening allergic reactions 2 each 1    levothyroxine (SYNTHROID, LEVOTHROID) 75 MCG tablet Take 75 mcg by mouth daily before breakfast.      meloxicam (MOBIC) 15 MG tablet Take 15 mg by mouth daily.      mirtazapine (REMERON) 7.5 MG tablet Take 7.5 mg by mouth at bedtime.      montelukast (SINGULAIR) 10 MG tablet Take 10 mg by mouth at bedtime.      omeprazole (PRILOSEC) 10 MG capsule Take 10 mg by mouth daily.      polyethylene glycol (MIRALAX / GLYCOLAX) 17 g packet Take 17 g by mouth daily as needed for moderate constipation.      pregabalin (LYRICA) 150 MG capsule Take 150 mg by mouth 2 (two) times daily.      rosuvastatin (CRESTOR) 20 MG tablet Take 20 mg by mouth at bedtime.      sertraline (ZOLOFT) 100 MG tablet Take 100 mg by mouth daily.      SUMAtriptan (IMITREX) 50 MG tablet Take 50 mg by mouth every 2 (two) hours as needed for migraine or headache. May repeat in 2 hours if headache persists or recurs.      traMADol (ULTRAM) 50 MG tablet Take 50 mg by mouth at bedtime as needed for  moderate pain.      TRIAMCINOLONE ACETONIDE EX Apply 1 application topically daily as needed (eczema).      No current facility-administered medications for this visit.   Allergies  Allergen Reactions   Sulfa Antibiotics Hives   Demerol [Meperidine] Hives   Garlic Diarrhea and Swelling   Gluten Meal Diarrhea and Swelling    Bloating    Onion Diarrhea and Swelling    Social History   Tobacco Use   Smoking status: Former   Smokeless tobacco: Former  Substance Use Topics   Alcohol use: Yes    Family History  Problem Relation Age of Onset   Asthma Daughter    Asthma Brother    Allergic rhinitis Neg Hx    Angioedema Neg Hx    Atopy Neg Hx    Eczema Neg Hx    Immunodeficiency Neg Hx    Urticaria Neg Hx      Review of  Systems  Musculoskeletal:  Positive for arthralgias.  All other systems reviewed and are negative.  Objective:  Physical Exam Constitutional:      General: She is not in acute distress.    Appearance: Normal appearance.  HENT:     Head: Normocephalic and atraumatic.  Eyes:     Extraocular Movements: Extraocular movements intact.     Pupils: Pupils are equal, round, and reactive to light.  Cardiovascular:     Rate and Rhythm: Normal rate and regular rhythm.     Pulses: Normal pulses.     Heart sounds: Normal heart sounds.  Pulmonary:     Effort: Pulmonary effort is normal. No respiratory distress.     Breath sounds: Normal breath sounds.  Abdominal:     General: Abdomen is flat. Bowel sounds are normal. There is no distension.     Palpations: Abdomen is soft.     Tenderness: There is no abdominal tenderness.  Musculoskeletal:     Cervical back: Normal range of motion and neck supple.     Left hip: Tenderness and bony tenderness present. Decreased range of motion. Decreased strength.     Right knee: Swelling and bony tenderness present. No effusion or erythema. Tenderness present. MCL laxity present.  Lymphadenopathy:     Cervical: No cervical adenopathy.  Skin:    General: Skin is warm and dry.     Findings: No erythema or rash.  Neurological:     General: No focal deficit present.     Mental Status: She is alert and oriented to person, place, and time.  Psychiatric:        Mood and Affect: Mood normal.        Behavior: Behavior normal.    Vital signs in last 24 hours: @VSRANGES @  Labs:   Estimated body mass index is 26.57 kg/m as calculated from the following:   Height as of 02/12/20: 5\' 3"  (1.6 m).   Weight as of 02/12/20: 68 kg.   Imaging Review Plain radiographs demonstrate severe degenerative joint disease of the left hip(s). The bone quality appears to be good for age and reported activity level.      Assessment/Plan:  End stage arthritis, left  hip(s)  The patient history, physical examination, clinical judgement of the provider and imaging studies are consistent with end stage degenerative joint disease of the left hip(s) and total hip arthroplasty is deemed medically necessary. The treatment options including medical management, injection therapy, arthroscopy and arthroplasty were discussed at length. The risks and benefits of total hip arthroplasty  were presented and reviewed. The risks due to aseptic loosening, infection, stiffness, dislocation/subluxation,  thromboembolic complications and other imponderables were discussed.  The patient acknowledged the explanation, agreed to proceed with the plan and consent was signed. Patient is being admitted for inpatient treatment for surgery, pain control, PT, OT, prophylactic antibiotics, VTE prophylaxis, progressive ambulation and ADL's and discharge planning.The patient is planning to be discharged  home with outpt PT.  Also discussed her advanced Right knee OA, planning on intraop cortisone injection.   Anticipated LOS equal to or greater than 2 midnights due to - Age 73 and older with one or more of the following:  - Obesity  - Expected need for hospital services (PT, OT, Nursing) required for safe  discharge  - Anticipated need for postoperative skilled nursing care or inpatient rehab  - Active co-morbidities: None OR   - Unanticipated findings during/Post Surgery: None  - Patient is a high risk of re-admission due to: None

## 2021-06-09 NOTE — Patient Instructions (Addendum)
DUE TO COVID-19 ONLY ONE VISITOR IS ALLOWED TO COME WITH YOU AND STAY IN THE WAITING ROOM ONLY DURING PRE OP AND PROCEDURE.   **NO VISITORS ARE ALLOWED IN THE SHORT STAY AREA OR RECOVERY ROOM!!**  IF YOU WILL BE ADMITTED INTO THE HOSPITAL YOU ARE ALLOWED ONLY TWO SUPPORT PEOPLE DURING VISITATION HOURS ONLY (7 AM -8PM)   The support person(s) must pass our screening, gel in and out, and wear a mask at all times, including in the patients room. Patients must also wear a mask when staff or their support person are in the room. Visitors GUEST BADGE MUST BE WORN VISIBLY  One adult visitor may remain with you overnight and MUST be in the room by 8 P.M.  No visitors under the age of 20. Any visitor under the age of 33 must be accompanied by an adult.    COVID SWAB TESTING MUST BE COMPLETED ON:  06/19/21 @ 12:00 PM   Site: [facility] [place] [place] Enter: Main Entrance have a seat in the waiting area to the right of main entrance (DO NOT [place]!!!!!) Dial: [phone] to alert staff you have arrived  You are not required to quarantine, however you are required to wear a well-fitted mask when you are out and around people not in your household.  Hand Hygiene often Do NOT share personal items Notify your provider if you are in close contact with someone who has COVID or you develop fever 100.4 or greater, new onset of sneezing, cough, sore throat, shortness of breath or body aches.       Your procedure is scheduled on: 06/23/21   Report to [facility] Main Entrance    Report to admitting at 8:00 AM   Call this number if you have problems the morning of surgery [phone]   Do not eat food :After Midnight.   After Midnight you may have the following liquids until 7:45 AM DAY OF SURGERY  Water Black Coffee (sugar ok, NO MILK/CREAM OR CREAMERS)  Tea (sugar ok, NO MILK/CREAM OR CREAMERS) regular and decaf                             Plain  Jell-O (NO RED)                                           Fruit ices (not with fruit pulp, NO RED)                                     Popsicles (NO RED)                                                                  Juice: apple, WHITE grape, WHITE cranberry Sports drinks like Gatorade (NO RED) Clear broth(vegetable,chicken,beef)  The day of surgery:  Drink ONE (1) Pre-Surgery Clear Ensure at 7:45 AM the morning of surgery. Drink in one sitting. Do not sip.  This drink was given to you during your  hospital  pre-op appointment visit. Nothing else to drink after completing the  Pre-Surgery Clear Ensure.          If you have questions, please contact your surgeons office.   FOLLOW BOWEL PREP AND ANY ADDITIONAL PRE OP INSTRUCTIONS YOU RECEIVED FROM YOUR SURGEON'S OFFICE!!!     Oral Hygiene is also important to reduce your risk of infection.                                    Remember - BRUSH YOUR TEETH THE MORNING OF SURGERY WITH YOUR REGULAR TOOTHPASTE   Stop all vitamins and supplements 7 days before surgery.   Take these medicines the morning of surgery with A SIP OF WATER: Levothyroxine, Omeprazole, Lyrica, Sertraline                               You may not have any metal on your body including hair pins, jewelry, and body piercing             Do not wear make-up, lotions, powders, perfumes, or deodorant  Do not wear nail polish including gel and S&S, artificial/acrylic nails, or any other type of covering on natural nails including finger and toenails. If you have artificial nails, gel coating, etc. that needs to be removed by a nail salon please have this removed prior to surgery or surgery may need to be canceled/ delayed if the surgeon/ anesthesia feels like they are unable to be safely monitored.   Do not shave  48 hours prior to surgery.    Do not bring valuables to the hospital. [place].   Contacts, dentures or  bridgework may not be worn into surgery.   Bring small overnight bag day of surgery.              Please read over the following fact sheets you were given: IF YOU HAVE QUESTIONS ABOUT YOUR PRE-OP INSTRUCTIONS PLEASE CALL [facility] - Preparing for Surgery Before surgery, you can play an important role.  Because skin is not sterile, your skin needs to be as free of germs as possible.  You can reduce the number of germs on your skin by washing with CHG (chlorahexidine gluconate) soap before surgery.  CHG is an antiseptic cleaner which kills germs and bonds with the skin to continue killing germs even after washing. Please DO NOT use if you have an allergy to CHG or antibacterial soaps.  If your skin becomes reddened/irritated stop using the CHG and inform your nurse when you arrive at Short Stay. Do not shave (including legs and underarms) for at least 48 hours prior to the first CHG shower.  You may shave your face/neck.  Please follow these instructions carefully:  1.  Shower with CHG Soap the night before surgery and the  morning of surgery.  2.  If you choose to wash your hair, wash your hair first as usual with your normal  shampoo.  3.  After you shampoo, rinse your hair and body thoroughly to remove the shampoo.  4.  Use CHG as you would any other liquid soap.  You can apply chg directly to the skin and wash.  Gently with a scrungie or clean washcloth.  5.  Apply the CHG Soap to your body ONLY FROM THE NECK DOWN.   Do   not use on face/ open                           Wound or open sores. Avoid contact with eyes, ears mouth and   genitals (private parts).                       Wash face,  Genitals (private parts) with your normal soap.             6.  Wash thoroughly, paying special attention to the area where your    surgery  will be performed.  7.  Thoroughly rinse your body with warm water from the neck down.  8.  DO NOT shower/wash  with your normal soap after using and rinsing off the CHG Soap.                9.  Pat yourself dry with a clean towel.            10.  Wear clean pajamas.            11.  Place clean sheets on your bed the night of your first shower and do not  sleep with pets. Day of Surgery : Do not apply any lotions/deodorants the morning of surgery.  Please wear clean clothes to the hospital/surgery center.  FAILURE TO FOLLOW THESE INSTRUCTIONS MAY RESULT IN THE CANCELLATION OF YOUR SURGERY  PATIENT SIGNATURE_________________________________  NURSE SIGNATURE__________________________________  ________________________________________________________________________   Adam Phenix  An incentive spirometer is a tool that can help keep your lungs clear and active. This tool measures how well you are filling your lungs with each breath. Taking long deep breaths may help reverse or decrease the chance of developing breathing (pulmonary) problems (especially infection) following: A long period of time when you are unable to move or be active. BEFORE THE PROCEDURE  If the spirometer includes an indicator to show your best effort, your nurse or respiratory therapist will set it to a desired goal. If possible, sit up straight or lean slightly forward. Try not to slouch. Hold the incentive spirometer in an upright position. INSTRUCTIONS FOR USE  Sit on the edge of your bed if possible, or sit up as far as you can in bed or on a chair. Hold the incentive spirometer in an upright position. Breathe out normally. Place the mouthpiece in your mouth and seal your lips tightly around it. Breathe in slowly and as deeply as possible, raising the piston or the ball toward the top of the column. Hold your breath for 3-5 seconds or for as long as possible. Allow the piston or ball to fall to the bottom of the column. Remove the mouthpiece from your mouth and breathe out normally. Rest for a few seconds and  repeat Steps 1 through 7 at least 10 times every 1-2 hours when you are awake. Take your time and take a few normal breaths between deep breaths. The spirometer may include an indicator to show your best effort. Use the indicator as a goal to work toward during each repetition. After each set of 10 deep breaths, practice coughing to be sure  your lungs are clear. If you have an incision (the cut made at the time of surgery), support your incision when coughing by placing a pillow or rolled up towels firmly against it. Once you are able to get out of bed, walk around indoors and cough well. You may stop using the incentive spirometer when instructed by your caregiver.  RISKS AND COMPLICATIONS Take your time so you do not get dizzy or light-headed. If you are in pain, you may need to take or ask for pain medication before doing incentive spirometry. It is harder to take a deep breath if you are having pain. AFTER USE Rest and breathe slowly and easily. It can be helpful to keep track of a log of your progress. Your caregiver can provide you with a simple table to help with this. If you are using the spirometer at home, follow these instructions: [place] IF:  You are having difficultly using the spirometer. You have trouble using the spirometer as often as instructed. Your pain medication is not giving enough relief while using the spirometer. You develop fever of 100.5 F (38.1 C) or higher. SEEK IMMEDIATE MEDICAL CARE IF:  You cough up bloody sputum that had not been present before. You develop fever of 102 F (38.9 C) or greater. You develop worsening pain at or near the incision site. MAKE SURE YOU:  Understand these instructions. Will watch your condition. Will get help right away if you are not doing well or get worse. Document Released: 08/10/2006 Document Revised: 06/22/2011 Document Reviewed: 10/11/2006 ExitCare Patient Information 2014 ExitCare,  [place].   ________________________________________________________________________  WHAT IS A BLOOD TRANSFUSION? Blood Transfusion Information  A transfusion is the replacement of blood or some of its parts. Blood is made up of multiple cells which provide different functions. Red blood cells carry oxygen and are used for blood loss replacement. White blood cells fight against infection. Platelets control bleeding. Plasma helps clot blood. Other blood products are available for specialized needs, such as hemophilia or other clotting disorders. BEFORE THE TRANSFUSION  Who gives blood for transfusions?  Healthy volunteers who are fully evaluated to make sure their blood is safe. This is blood bank blood. Transfusion therapy is the safest it has ever been in the practice of medicine. Before blood is taken from a donor, a complete history is taken to make sure that person has no history of diseases nor engages in risky social behavior (examples are intravenous drug use or sexual activity with multiple partners). The donor's travel history is screened to minimize risk of transmitting infections, such as malaria. The donated blood is tested for signs of infectious diseases, such as HIV and hepatitis. The blood is then tested to be sure it is compatible with you in order to minimize the chance of a transfusion reaction. If you or a relative donates blood, this is often done in anticipation of surgery and is not appropriate for emergency situations. It takes many days to process the donated blood. RISKS AND COMPLICATIONS Although transfusion therapy is very safe and saves many lives, the main dangers of transfusion include:  Getting an infectious disease. Developing a transfusion reaction. This is an allergic reaction to something in the blood you were given. Every precaution is taken to prevent this. The decision to have a blood transfusion has been considered carefully by your caregiver before blood is  given. Blood is not given unless the benefits outweigh the risks. AFTER THE TRANSFUSION Right after receiving a blood  transfusion, you will usually feel much better and more energetic. This is especially true if your red blood cells have gotten low (anemic). The transfusion raises the level of the red blood cells which carry oxygen, and this usually causes an energy increase. The nurse administering the transfusion will monitor you carefully for complications. HOME CARE INSTRUCTIONS  No special instructions are needed after a transfusion. You may find your energy is better. Speak with your caregiver about any limitations on activity for underlying diseases you may have. SEEK MEDICAL CARE IF:  Your condition is not improving after your transfusion. You develop redness or irritation at the intravenous (IV) site. SEEK IMMEDIATE MEDICAL CARE IF:  Any of the following symptoms occur over the next 12 hours: Shaking chills. You have a temperature by mouth above 102 F (38.9 C), not controlled by medicine. Chest, back, or muscle pain. People around you feel you are not acting correctly or are confused. Shortness of breath or difficulty breathing. Dizziness and fainting. You get a rash or develop hives. You have a decrease in urine output. Your urine turns a dark color or changes to pink, red, or brown. Any of the following symptoms occur over the next 10 days: You have a temperature by mouth above 102 F (38.9 C), not controlled by medicine. Shortness of breath. Weakness after normal activity. The white part of the eye turns yellow (jaundice). You have a decrease in the amount of urine or are urinating less often. Your urine turns a dark color or changes to pink, red, or brown. Document Released: 03/27/2000 Document Revised: 06/22/2011 Document Reviewed: 11/14/2007 [facility] Patient Information 2014 [place], [place].  _______________________________________________________________________

## 2021-06-09 NOTE — Progress Notes (Addendum)
COVID swab appointment: 06/19/21 @ 1200  COVID Vaccine Completed: yes x2 Date COVID Vaccine completed:01/10/20, 12/02/20  Has received booster: COVID vaccine manufacturer: Pfizer      Date of COVID positive in last 90 days: no  PCP - Marlou Porch, MD Cardiologist - n/a  Chest x-ray - n/a EKG - n/a Stress Test - yes late 1990s per pt ECHO - yes late 1990s per pt Cardiac Cath - n/a Pacemaker/ICD device last checked: n/a Spinal Cord Stimulator: n/a  Bowel Prep - no  Sleep Study - n/a CPAP -   Fasting Blood Sugar - n/a Checks Blood Sugar _____ times a day  Blood Thinner Instructions: n/a Aspirin Instructions: Last Dose:  Activity level: Can go up a flight of stairs and perform activities of daily living without stopping and without symptoms of chest pain or shortness of breath.     Anesthesia review:   Patient denies shortness of breath, fever, cough and chest pain at PAT appointment   Patient verbalized understanding of instructions that were given to them at the PAT appointment. Patient was also instructed that they will need to review over the PAT instructions again at home before surgery.

## 2021-06-10 ENCOUNTER — Encounter (HOSPITAL_COMMUNITY): Payer: Self-pay

## 2021-06-10 ENCOUNTER — Encounter (HOSPITAL_COMMUNITY)
Admission: RE | Admit: 2021-06-10 | Discharge: 2021-06-10 | Disposition: A | Discharge status: Home / Self Care | Payer: Medicare Other | Source: Ambulatory Visit | Attending: Orthopedic Surgery | Admitting: Orthopedic Surgery

## 2021-06-10 ENCOUNTER — Other Ambulatory Visit: Payer: Self-pay

## 2021-06-10 ENCOUNTER — Ambulatory Visit: Payer: Self-pay | Admitting: Physician Assistant

## 2021-06-10 VITALS — BP 132/84 | HR 69 | Temp 98.0°F | Ht 62.0 in | Wt 143.0 lb

## 2021-06-10 DIAGNOSIS — M25552 Pain in left hip: Secondary | ICD-10-CM | POA: Insufficient documentation

## 2021-06-10 DIAGNOSIS — Z20822 Contact with and (suspected) exposure to covid-19: Secondary | ICD-10-CM | POA: Insufficient documentation

## 2021-06-10 DIAGNOSIS — G8929 Other chronic pain: Secondary | ICD-10-CM | POA: Diagnosis not present

## 2021-06-10 DIAGNOSIS — Z01812 Encounter for preprocedural laboratory examination: Secondary | ICD-10-CM | POA: Insufficient documentation

## 2021-06-10 DIAGNOSIS — Z01818 Encounter for other preprocedural examination: Secondary | ICD-10-CM

## 2021-06-10 LAB — COMPREHENSIVE METABOLIC PANEL
ALT: 29 U/L (ref 0–44)
AST: 31 U/L (ref 15–41)
Albumin: 4.3 g/dL (ref 3.5–5.0)
Alkaline Phosphatase: 78 U/L (ref 38–126)
Anion gap: 7 (ref 5–15)
BUN: 28 mg/dL — ABNORMAL HIGH (ref 8–23)
CO2: 25 mmol/L (ref 22–32)
Calcium: 10 mg/dL (ref 8.9–10.3)
Chloride: 108 mmol/L (ref 98–111)
Creatinine, Ser: 0.79 mg/dL (ref 0.44–1.00)
GFR, Estimated: >60 mL/min (ref >60)
Glucose, Bld: 95 mg/dL (ref 70–99)
Potassium: 4.6 mmol/L (ref 3.5–5.1)
Sodium: 140 mmol/L (ref 135–145)
Total Bilirubin: 0.4 mg/dL (ref 0.3–1.2)
Total Protein: 6.7 g/dL (ref 6.5–8.1)

## 2021-06-10 LAB — CBC WITH DIFFERENTIAL/PLATELET
Abs Immature Granulocytes: 0.02 10*3/uL (ref 0.00–0.07)
Basophils Absolute: 0 10*3/uL (ref 0.0–0.1)
Basophils Relative: 1 %
Eosinophils Absolute: 0.2 10*3/uL (ref 0.0–0.5)
Eosinophils Relative: 4 %
HCT: 43.9 % (ref 36.0–46.0)
Hemoglobin: 14.2 g/dL (ref 12.0–15.0)
Immature Granulocytes: 0 %
Lymphocytes Relative: 23 %
Lymphs Abs: 1.3 10*3/uL (ref 0.7–4.0)
MCH: 30.7 pg (ref 26.0–34.0)
MCHC: 32.3 g/dL (ref 30.0–36.0)
MCV: 95 fL (ref 80.0–100.0)
Monocytes Absolute: 0.4 10*3/uL (ref 0.1–1.0)
Monocytes Relative: 8 %
Neutro Abs: 3.6 10*3/uL (ref 1.7–7.7)
Neutrophils Relative %: 64 %
Platelets: 219 10*3/uL (ref 150–400)
RBC: 4.62 MIL/uL (ref 3.87–5.11)
RDW: 13.2 % (ref 11.5–15.5)
WBC: 5.7 10*3/uL (ref 4.0–10.5)
nRBC: 0 % (ref 0.0–0.2)

## 2021-06-10 LAB — TYPE AND SCREEN
ABO/RH(D): O POS
Antibody Screen: NEGATIVE

## 2021-06-10 LAB — SURGICAL PCR SCREEN
MRSA, PCR: NEGATIVE
Staphylococcus aureus: NEGATIVE

## 2021-06-19 ENCOUNTER — Encounter (HOSPITAL_COMMUNITY)
Admission: RE | Admit: 2021-06-19 | Discharge: 2021-06-19 | Disposition: A | Discharge status: Home / Self Care | Payer: Medicare Other | Source: Ambulatory Visit | Attending: Orthopedic Surgery | Admitting: Orthopedic Surgery

## 2021-06-19 ENCOUNTER — Other Ambulatory Visit: Payer: Self-pay

## 2021-06-19 DIAGNOSIS — Z20822 Contact with and (suspected) exposure to covid-19: Secondary | ICD-10-CM | POA: Diagnosis not present

## 2021-06-19 DIAGNOSIS — Z01812 Encounter for preprocedural laboratory examination: Secondary | ICD-10-CM | POA: Diagnosis present

## 2021-06-19 LAB — SARS CORONAVIRUS 2 (TAT 6-24 HRS): SARS Coronavirus 2: NEGATIVE

## 2021-06-20 NOTE — Progress Notes (Signed)
Pt aware of surgical time change for Monday 06/23/2021. Pt to arrive at [facility] admitting at 11:15 am. No food after midnight; clear liquids from midnight till 11 am day of surgery consuming entire pre surgery drink by 11 am then nothing by mouth.  ?

## 2021-06-23 ENCOUNTER — Encounter (HOSPITAL_COMMUNITY): Payer: Self-pay | Admitting: Orthopedic Surgery

## 2021-06-23 ENCOUNTER — Inpatient Hospital Stay (HOSPITAL_COMMUNITY)
Admission: RE | Admit: 2021-06-23 | Discharge: 2021-06-25 | DRG: 470 | Disposition: A | Discharge status: Home / Self Care | Payer: Medicare Other | Attending: Orthopedic Surgery | Admitting: Orthopedic Surgery

## 2021-06-23 ENCOUNTER — Ambulatory Visit (HOSPITAL_BASED_OUTPATIENT_CLINIC_OR_DEPARTMENT_OTHER): Payer: Medicare Other | Admitting: Anesthesiology

## 2021-06-23 ENCOUNTER — Ambulatory Visit (HOSPITAL_COMMUNITY): Payer: Medicare Other | Admitting: Anesthesiology

## 2021-06-23 ENCOUNTER — Encounter (HOSPITAL_COMMUNITY)
Admission: RE | Disposition: A | Discharge status: Home / Self Care | Payer: Self-pay | Source: Home / Self Care | Attending: Orthopedic Surgery

## 2021-06-23 ENCOUNTER — Other Ambulatory Visit: Payer: Self-pay

## 2021-06-23 ENCOUNTER — Ambulatory Visit (HOSPITAL_COMMUNITY): Payer: Medicare Other

## 2021-06-23 DIAGNOSIS — Z419 Encounter for procedure for purposes other than remedying health state, unspecified: Secondary | ICD-10-CM

## 2021-06-23 DIAGNOSIS — M1711 Unilateral primary osteoarthritis, right knee: Secondary | ICD-10-CM

## 2021-06-23 DIAGNOSIS — M1611 Unilateral primary osteoarthritis, right hip: Secondary | ICD-10-CM

## 2021-06-23 DIAGNOSIS — M419 Scoliosis, unspecified: Secondary | ICD-10-CM | POA: Diagnosis present

## 2021-06-23 DIAGNOSIS — Z01818 Encounter for other preprocedural examination: Secondary | ICD-10-CM

## 2021-06-23 DIAGNOSIS — Z79899 Other long term (current) drug therapy: Secondary | ICD-10-CM

## 2021-06-23 DIAGNOSIS — Z791 Long term (current) use of non-steroidal anti-inflammatories (NSAID): Secondary | ICD-10-CM

## 2021-06-23 DIAGNOSIS — Z20822 Contact with and (suspected) exposure to covid-19: Secondary | ICD-10-CM | POA: Diagnosis present

## 2021-06-23 DIAGNOSIS — Z79890 Hormone replacement therapy (postmenopausal): Secondary | ICD-10-CM

## 2021-06-23 DIAGNOSIS — Z882 Allergy status to sulfonamides status: Secondary | ICD-10-CM

## 2021-06-23 DIAGNOSIS — M810 Age-related osteoporosis without current pathological fracture: Secondary | ICD-10-CM | POA: Diagnosis present

## 2021-06-23 DIAGNOSIS — I10 Essential (primary) hypertension: Secondary | ICD-10-CM | POA: Diagnosis present

## 2021-06-23 DIAGNOSIS — J309 Allergic rhinitis, unspecified: Secondary | ICD-10-CM | POA: Diagnosis present

## 2021-06-23 DIAGNOSIS — Z91018 Allergy to other foods: Secondary | ICD-10-CM

## 2021-06-23 DIAGNOSIS — Z9889 Other specified postprocedural states: Secondary | ICD-10-CM

## 2021-06-23 DIAGNOSIS — Z87891 Personal history of nicotine dependence: Secondary | ICD-10-CM

## 2021-06-23 DIAGNOSIS — F32A Depression, unspecified: Secondary | ICD-10-CM | POA: Diagnosis present

## 2021-06-23 DIAGNOSIS — E039 Hypothyroidism, unspecified: Secondary | ICD-10-CM | POA: Diagnosis present

## 2021-06-23 DIAGNOSIS — Z85850 Personal history of malignant neoplasm of thyroid: Secondary | ICD-10-CM

## 2021-06-23 DIAGNOSIS — Z885 Allergy status to narcotic agent status: Secondary | ICD-10-CM

## 2021-06-23 DIAGNOSIS — G43909 Migraine, unspecified, not intractable, without status migrainosus: Secondary | ICD-10-CM | POA: Diagnosis present

## 2021-06-23 DIAGNOSIS — M1612 Unilateral primary osteoarthritis, left hip: Principal | ICD-10-CM | POA: Diagnosis present

## 2021-06-23 DIAGNOSIS — E785 Hyperlipidemia, unspecified: Secondary | ICD-10-CM | POA: Diagnosis present

## 2021-06-23 LAB — ABO/RH: ABO/RH(D): O POS

## 2021-06-23 SURGERY — ARTHROPLASTY, HIP, TOTAL,POSTERIOR APPROACH
Anesthesia: Spinal | Site: Knee | Laterality: Right

## 2021-06-23 MED ORDER — PHENOL 1.4 % MT LIQD: 1.0000 | OROMUCOSAL | Status: DC | PRN

## 2021-06-23 MED ORDER — EPHEDRINE SULFATE (PRESSORS) 50 MG/ML IJ SOLN
INTRAMUSCULAR | Status: DC | PRN
  Administered 2021-06-23 (×2): 10 mg via INTRAVENOUS

## 2021-06-23 MED ORDER — 0.9 % SODIUM CHLORIDE (POUR BTL) OPTIME
TOPICAL | Status: DC | PRN
  Administered 2021-06-23: 1000 mL

## 2021-06-23 MED ORDER — CHLORHEXIDINE GLUCONATE 0.12 % MT SOLN
15.0000 mL | Freq: Once | OROMUCOSAL | Status: AC
  Administered 2021-06-23: 15 mL via OROMUCOSAL

## 2021-06-23 MED ORDER — VITAMIN D 25 MCG (1000 UNIT) PO TABS
1000.0000 [IU] | ORAL_TABLET | Freq: Every day | ORAL | Status: DC
  Administered 2021-06-24 – 2021-06-25 (×2): 1000 [IU] via ORAL
  Filled 2021-06-23 (×2): qty 1

## 2021-06-23 MED ORDER — SODIUM CHLORIDE (PF) 0.9 % IJ SOLN
INTRAMUSCULAR | Status: AC
  Filled 2021-06-23: qty 10

## 2021-06-23 MED ORDER — PANTOPRAZOLE SODIUM 40 MG PO TBEC
40.0000 mg | DELAYED_RELEASE_TABLET | Freq: Every day | ORAL | Status: DC
  Administered 2021-06-24 – 2021-06-25 (×2): 40 mg via ORAL
  Filled 2021-06-23 (×3): qty 1

## 2021-06-23 MED ORDER — ROSUVASTATIN CALCIUM 20 MG PO TABS
20.0000 mg | ORAL_TABLET | Freq: Every day | ORAL | Status: DC
  Administered 2021-06-23 – 2021-06-24 (×2): 20 mg via ORAL
  Filled 2021-06-23 (×2): qty 1

## 2021-06-23 MED ORDER — MENTHOL 3 MG MT LOZG: 1.0000 | LOZENGE | OROMUCOSAL | Status: DC | PRN

## 2021-06-23 MED ORDER — DOCUSATE SODIUM 100 MG PO CAPS
100.0000 mg | ORAL_CAPSULE | Freq: Two times a day (BID) | ORAL | Status: DC
  Administered 2021-06-23 – 2021-06-25 (×4): 100 mg via ORAL
  Filled 2021-06-23 (×4): qty 1

## 2021-06-23 MED ORDER — DICYCLOMINE HCL 10 MG PO CAPS
10.0000 mg | ORAL_CAPSULE | Freq: Three times a day (TID) | ORAL | Status: DC
  Administered 2021-06-24 – 2021-06-25 (×4): 10 mg via ORAL
  Filled 2021-06-23 (×7): qty 1

## 2021-06-23 MED ORDER — CEFAZOLIN SODIUM-DEXTROSE 2-4 GM/100ML-% IV SOLN
2.0000 g | INTRAVENOUS | Status: AC
  Administered 2021-06-23: 2 g via INTRAVENOUS
  Filled 2021-06-23: qty 100

## 2021-06-23 MED ORDER — ORAL CARE MOUTH RINSE: 15.0000 mL | Freq: Once | OROMUCOSAL | Status: AC

## 2021-06-23 MED ORDER — SODIUM CHLORIDE 0.9 % IR SOLN
Status: DC | PRN
  Administered 2021-06-23: 1000 mL

## 2021-06-23 MED ORDER — BUPIVACAINE LIPOSOME 1.3 % IJ SUSP: 10.0000 mL | Freq: Once | INTRAMUSCULAR | Status: AC

## 2021-06-23 MED ORDER — ONDANSETRON HCL 4 MG/2ML IJ SOLN
INTRAMUSCULAR | Status: DC | PRN
Start: 2021-06-23 — End: 2021-06-23
  Administered 2021-06-23: 4 mg via INTRAVENOUS

## 2021-06-23 MED ORDER — PHENYLEPHRINE 40 MCG/ML (10ML) SYRINGE FOR IV PUSH (FOR BLOOD PRESSURE SUPPORT)
PREFILLED_SYRINGE | INTRAVENOUS | Status: AC
  Filled 2021-06-23: qty 20

## 2021-06-23 MED ORDER — CEFAZOLIN SODIUM-DEXTROSE 2-4 GM/100ML-% IV SOLN
2.0000 g | Freq: Four times a day (QID) | INTRAVENOUS | Status: AC
  Administered 2021-06-23 – 2021-06-24 (×2): 2 g via INTRAVENOUS
  Filled 2021-06-23 (×2): qty 100

## 2021-06-23 MED ORDER — ACETAMINOPHEN 325 MG PO TABS: 325.0000 mg | ORAL_TABLET | Freq: Four times a day (QID) | ORAL | Status: DC | PRN

## 2021-06-23 MED ORDER — BUPIVACAINE HCL (PF) 0.5 % IJ SOLN
INTRAMUSCULAR | Status: AC
  Filled 2021-06-23: qty 30

## 2021-06-23 MED ORDER — ASPIRIN 81 MG PO CHEW
81.0000 mg | CHEWABLE_TABLET | Freq: Two times a day (BID) | ORAL | Status: DC
  Administered 2021-06-23 – 2021-06-25 (×4): 81 mg via ORAL
  Filled 2021-06-23 (×4): qty 1

## 2021-06-23 MED ORDER — BUPIVACAINE LIPOSOME 1.3 % IJ SUSP
INTRAMUSCULAR | Status: AC
  Filled 2021-06-23: qty 20

## 2021-06-23 MED ORDER — TRANEXAMIC ACID-NACL 1000-0.7 MG/100ML-% IV SOLN
1000.0000 mg | INTRAVENOUS | Status: AC
  Administered 2021-06-23: 1000 mg via INTRAVENOUS
  Filled 2021-06-23: qty 100

## 2021-06-23 MED ORDER — OXYCODONE HCL 5 MG PO TABS
5.0000 mg | ORAL_TABLET | ORAL | Status: DC | PRN
  Administered 2021-06-24 (×2): 10 mg via ORAL
  Administered 2021-06-24 – 2021-06-25 (×2): 5 mg via ORAL
  Administered 2021-06-25: 10 mg via ORAL
  Filled 2021-06-23: qty 1
  Filled 2021-06-23 (×3): qty 2
  Filled 2021-06-23: qty 1

## 2021-06-23 MED ORDER — PROPOFOL 500 MG/50ML IV EMUL
INTRAVENOUS | Status: DC | PRN
  Administered 2021-06-23: 75 ug/kg/min via INTRAVENOUS

## 2021-06-23 MED ORDER — FENTANYL CITRATE (PF) 100 MCG/2ML IJ SOLN
INTRAMUSCULAR | Status: DC | PRN
Start: 2021-06-23 — End: 2021-06-23
  Administered 2021-06-23: 100 ug via INTRAVENOUS

## 2021-06-23 MED ORDER — ASCORBIC ACID 500 MG PO TABS
1000.0000 mg | ORAL_TABLET | Freq: Every day | ORAL | Status: DC
  Administered 2021-06-24 – 2021-06-25 (×2): 1000 mg via ORAL
  Filled 2021-06-23 (×2): qty 2

## 2021-06-23 MED ORDER — TRANEXAMIC ACID 1000 MG/10ML IV SOLN
INTRAVENOUS | Status: DC | PRN
  Administered 2021-06-23: 2000 mg via TOPICAL

## 2021-06-23 MED ORDER — LEVOTHYROXINE SODIUM 75 MCG PO TABS
75.0000 ug | ORAL_TABLET | Freq: Every day | ORAL | Status: DC
  Administered 2021-06-24 – 2021-06-25 (×2): 75 ug via ORAL
  Filled 2021-06-23 (×2): qty 1

## 2021-06-23 MED ORDER — PHENYLEPHRINE 40 MCG/ML (10ML) SYRINGE FOR IV PUSH (FOR BLOOD PRESSURE SUPPORT)
PREFILLED_SYRINGE | INTRAVENOUS | Status: AC
  Filled 2021-06-23: qty 10

## 2021-06-23 MED ORDER — PHENYLEPHRINE HCL (PRESSORS) 10 MG/ML IV SOLN
INTRAVENOUS | Status: DC | PRN
  Administered 2021-06-23: 120 ug via INTRAVENOUS
  Administered 2021-06-23 (×3): 80 ug via INTRAVENOUS
  Administered 2021-06-23: 120 ug via INTRAVENOUS

## 2021-06-23 MED ORDER — TRANEXAMIC ACID 1000 MG/10ML IV SOLN
2000.0000 mg | INTRAVENOUS | Status: AC
  Filled 2021-06-23: qty 20

## 2021-06-23 MED ORDER — LACTATED RINGERS IV SOLN: INTRAVENOUS | Status: DC

## 2021-06-23 MED ORDER — SENNA 8.6 MG PO TABS
1.0000 | ORAL_TABLET | Freq: Two times a day (BID) | ORAL | Status: DC
  Administered 2021-06-23 – 2021-06-25 (×4): 8.6 mg via ORAL
  Filled 2021-06-23 (×4): qty 1

## 2021-06-23 MED ORDER — MIRTAZAPINE 15 MG PO TABS
7.5000 mg | ORAL_TABLET | Freq: Every day | ORAL | Status: DC
  Administered 2021-06-23 – 2021-06-24 (×2): 7.5 mg via ORAL
  Filled 2021-06-23 (×2): qty 1

## 2021-06-23 MED ORDER — DEXAMETHASONE SODIUM PHOSPHATE 10 MG/ML IJ SOLN
8.0000 mg | Freq: Once | INTRAMUSCULAR | Status: AC
  Administered 2021-06-23: 8 mg via INTRAVENOUS

## 2021-06-23 MED ORDER — ONDANSETRON HCL 4 MG/2ML IJ SOLN: 4.0000 mg | Freq: Four times a day (QID) | INTRAMUSCULAR | Status: DC | PRN

## 2021-06-23 MED ORDER — ONDANSETRON HCL 4 MG PO TABS: 4.0000 mg | ORAL_TABLET | Freq: Four times a day (QID) | ORAL | Status: DC | PRN

## 2021-06-23 MED ORDER — PROPOFOL 10 MG/ML IV BOLUS
INTRAVENOUS | Status: AC
  Filled 2021-06-23: qty 20

## 2021-06-23 MED ORDER — POVIDONE-IODINE 10 % EX SWAB
2.0000 "application " | Freq: Once | CUTANEOUS | Status: AC
  Administered 2021-06-23: 2 via TOPICAL

## 2021-06-23 MED ORDER — FENTANYL CITRATE (PF) 100 MCG/2ML IJ SOLN
INTRAMUSCULAR | Status: AC
  Filled 2021-06-23: qty 2

## 2021-06-23 MED ORDER — SODIUM CHLORIDE 0.9 % IV SOLN
INTRAVENOUS | Status: DC
Start: 2021-06-23 — End: 2021-06-23

## 2021-06-23 MED ORDER — DIPHENHYDRAMINE HCL 12.5 MG/5ML PO ELIX: 12.5000 mg | ORAL_SOLUTION | ORAL | Status: DC | PRN

## 2021-06-23 MED ORDER — SODIUM CHLORIDE (PF) 0.9 % IJ SOLN
INTRAMUSCULAR | Status: AC
  Filled 2021-06-23: qty 50

## 2021-06-23 MED ORDER — EPHEDRINE 5 MG/ML INJ
INTRAVENOUS | Status: AC
  Filled 2021-06-23: qty 5

## 2021-06-23 MED ORDER — KETOROLAC TROMETHAMINE 15 MG/ML IJ SOLN
7.5000 mg | Freq: Four times a day (QID) | INTRAMUSCULAR | Status: AC
  Administered 2021-06-23 – 2021-06-24 (×4): 7.5 mg via INTRAVENOUS
  Filled 2021-06-23 (×4): qty 1

## 2021-06-23 MED ORDER — SODIUM CHLORIDE (PF) 0.9 % IJ SOLN
INTRAMUSCULAR | Status: DC | PRN
  Administered 2021-06-23: 60 mL

## 2021-06-23 MED ORDER — BUPIVACAINE IN DEXTROSE 0.75-8.25 % IT SOLN
INTRATHECAL | Status: DC | PRN
  Administered 2021-06-23: 1.8 mL via INTRATHECAL

## 2021-06-23 MED ORDER — BUPIVACAINE LIPOSOME 1.3 % IJ SUSP
INTRAMUSCULAR | Status: DC | PRN
Start: 2021-06-23 — End: 2021-06-23
  Administered 2021-06-23: 20 mL

## 2021-06-23 MED ORDER — SERTRALINE HCL 100 MG PO TABS
100.0000 mg | ORAL_TABLET | Freq: Every day | ORAL | Status: DC
  Administered 2021-06-24 – 2021-06-25 (×2): 100 mg via ORAL
  Filled 2021-06-23 (×2): qty 1

## 2021-06-23 MED ORDER — ZOLPIDEM TARTRATE 5 MG PO TABS: 5.0000 mg | ORAL_TABLET | Freq: Every evening | ORAL | Status: DC | PRN

## 2021-06-23 MED ORDER — PREGABALIN 75 MG PO CAPS
150.0000 mg | ORAL_CAPSULE | Freq: Two times a day (BID) | ORAL | Status: DC
  Administered 2021-06-23 – 2021-06-25 (×4): 150 mg via ORAL
  Filled 2021-06-23 (×4): qty 2

## 2021-06-23 MED ORDER — TRIAMCINOLONE ACETONIDE 40 MG/ML IJ SUSP
INTRAMUSCULAR | Status: AC
  Filled 2021-06-23: qty 1

## 2021-06-23 MED ORDER — MONTELUKAST SODIUM 10 MG PO TABS
10.0000 mg | ORAL_TABLET | Freq: Every day | ORAL | Status: DC
  Administered 2021-06-23 – 2021-06-24 (×2): 10 mg via ORAL
  Filled 2021-06-23 (×2): qty 1

## 2021-06-23 MED ORDER — ACETAMINOPHEN 500 MG PO TABS
1000.0000 mg | ORAL_TABLET | Freq: Once | ORAL | Status: AC
  Administered 2021-06-23: 1000 mg via ORAL
  Filled 2021-06-23: qty 2

## 2021-06-23 MED ORDER — POLYETHYLENE GLYCOL 3350 17 G PO PACK: 17.0000 g | PACK | Freq: Every day | ORAL | Status: DC | PRN

## 2021-06-23 MED ORDER — WATER FOR IRRIGATION, STERILE IR SOLN
Status: DC | PRN
  Administered 2021-06-23: 2000 mL

## 2021-06-23 MED ORDER — HYDROMORPHONE HCL 1 MG/ML IJ SOLN
0.5000 mg | INTRAMUSCULAR | Status: DC | PRN
  Administered 2021-06-23: 1 mg via INTRAVENOUS
  Administered 2021-06-24: 0.5 mg via INTRAVENOUS
  Filled 2021-06-23 (×2): qty 1

## 2021-06-23 SURGICAL SUPPLY — 64 items
BAG COUNTER SPONGE SURGICOUNT (BAG) IMPLANT
BAG DECANTER FOR FLEXI CONT (MISCELLANEOUS) ×3 IMPLANT
BAG ZIPLOCK 12X15 (MISCELLANEOUS) ×3 IMPLANT
BLADE SAW SAG 25X90X1.19 (BLADE) IMPLANT
CHLORAPREP W/TINT 26 (MISCELLANEOUS) ×6 IMPLANT
COVER SURGICAL LIGHT HANDLE (MISCELLANEOUS) ×3 IMPLANT
DERMABOND ADVANCED (GAUZE/BANDAGES/DRESSINGS) ×1
DERMABOND ADVANCED .7 DNX12 (GAUZE/BANDAGES/DRESSINGS) ×2 IMPLANT
DRAPE 3/4 80X56 (DRAPES) ×6 IMPLANT
DRAPE HIP W/POCKET STRL (MISCELLANEOUS) ×3 IMPLANT
DRAPE INCISE IOBAN 66X45 STRL (DRAPES) ×3 IMPLANT
DRAPE INCISE IOBAN 85X60 (DRAPES) ×3 IMPLANT
DRAPE POUCH INSTRU U-SHP 10X18 (DRAPES) ×3 IMPLANT
DRAPE SURG 17X11 SM STRL (DRAPES) ×3 IMPLANT
DRAPE U-SHAPE 47X51 STRL (DRAPES) ×3 IMPLANT
DRESSING AQUACEL AG SP 3.5X10 (GAUZE/BANDAGES/DRESSINGS) ×2 IMPLANT
DRSG AQUACEL AG ADV 3.5X10 (GAUZE/BANDAGES/DRESSINGS) ×1 IMPLANT
DRSG AQUACEL AG SP 3.5X10 (GAUZE/BANDAGES/DRESSINGS) ×3
ELECT BLADE TIP CTD 4 INCH (ELECTRODE) ×3 IMPLANT
ELECT REM PT RETURN 15FT ADLT (MISCELLANEOUS) ×3 IMPLANT
GLOVE SRG 8 PF TXTR STRL LF DI (GLOVE) ×2 IMPLANT
GLOVE SURG ENC TEXT LTX SZ8 (GLOVE) ×6 IMPLANT
GLOVE SURG UNDER POLY LF SZ8 (GLOVE) ×1
GOWN STRL REUS W/TWL XL LVL3 (GOWN DISPOSABLE) ×3 IMPLANT
HANDPIECE INTERPULSE COAX TIP (DISPOSABLE)
HEAD BIOLOX HIP 28/+4 (Joint) IMPLANT
HIP BIOLOX HD 28/+4 (Joint) ×3 IMPLANT
HOLDER FOLEY CATH W/STRAP (MISCELLANEOUS) ×3 IMPLANT
HOOD PEEL AWAY FLYTE STAYCOOL (MISCELLANEOUS) ×9 IMPLANT
KIT BASIN OR (CUSTOM PROCEDURE TRAY) ×3 IMPLANT
KIT TURNOVER KIT A (KITS) ×1 IMPLANT
LINER 42MM E (Orthopedic Implant) ×1 IMPLANT
LINER ADM MDM INS 28/48 42E (Liner) ×1 IMPLANT
MANIFOLD NEPTUNE II (INSTRUMENTS) ×3 IMPLANT
MARKER SKIN DUAL TIP RULER LAB (MISCELLANEOUS) ×3 IMPLANT
NEEDLE HYPO 22GX1.5 SAFETY (NEEDLE) ×1 IMPLANT
NS IRRIG 1000ML POUR BTL (IV SOLUTION) ×3 IMPLANT
PACK TOTAL JOINT (CUSTOM PROCEDURE TRAY) ×3 IMPLANT
PROTECTOR NERVE ULNAR (MISCELLANEOUS) ×3 IMPLANT
RETRIEVER SUT HEWSON (MISCELLANEOUS) ×3 IMPLANT
SCREW HEX LP 6.5X25 (Screw) ×1 IMPLANT
SCREW HEX LP 6.5X30 (Screw) ×1 IMPLANT
SEALER BIPOLAR AQUA 6.0 (INSTRUMENTS) ×3 IMPLANT
SET HNDPC FAN SPRY TIP SCT (DISPOSABLE) IMPLANT
SHELL CLUSTERHOLE ACETABULAR 5 (Shell) ×1 IMPLANT
SPIKE FLUID TRANSFER (MISCELLANEOUS) ×9 IMPLANT
SPONGE T-LAP 18X18 ~~LOC~~+RFID (SPONGE) ×9 IMPLANT
STEM HIP 127 DEG (Stem) ×1 IMPLANT
SUCTION FRAZIER HANDLE 12FR (TUBING) ×1
SUCTION TUBE FRAZIER 12FR DISP (TUBING) ×2 IMPLANT
SUT BONE WAX W31G (SUTURE) ×3 IMPLANT
SUT ETHIBOND #5 BRAIDED 30INL (SUTURE) ×3 IMPLANT
SUT MNCRL AB 3-0 PS2 18 (SUTURE) ×3 IMPLANT
SUT STRATAFIX 0 PDS 27 VIOLET (SUTURE) ×6
SUT STRATAFIX PDO 1 14 VIOLET (SUTURE) ×1
SUT STRATFX PDO 1 14 VIOLET (SUTURE) ×2
SUT VIC AB 2-0 CT2 27 (SUTURE) ×6 IMPLANT
SUTURE STRATFX 0 PDS 27 VIOLET (SUTURE) ×2 IMPLANT
SUTURE STRATFX PDO 1 14 VIOLET (SUTURE) ×2 IMPLANT
SYR 20ML LL LF (SYRINGE) ×6 IMPLANT
TOWEL OR 17X26 10 PK STRL BLUE (TOWEL DISPOSABLE) ×3 IMPLANT
TRAY FOLEY MTR SLVR 16FR STAT (SET/KITS/TRAYS/PACK) ×3 IMPLANT
TUBE SUCTION HIGH CAP CLEAR NV (SUCTIONS) ×1 IMPLANT
WATER STERILE IRR 1000ML POUR (IV SOLUTION) ×6 IMPLANT

## 2021-06-23 NOTE — Interval H&P Note (Signed)
The patient has been re-examined, and the chart reviewed, and there have been no interval changes to the documented history and physical.   ? ?Plan for L THA and R knee corticosteroid injection today. 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 ? ?The operative side was examined and the patient was confirmed to have. Sens DPN, SPN, TN intact (TN, Sural and saphenous distributions relatively diminished due to baseline neuropathy bilaterally), Motor EHL, ext, flex 5/5, and DP 2+, PT 2+, No significant edema. ? ? ?The risks, benefits, and alternatives have been discussed at length with patient, and the patient is willing to proceed.  Left hip and right knee marked. Consent has been signed. ? ?

## 2021-06-23 NOTE — Anesthesia Postprocedure Evaluation (Signed)
Anesthesia Post Note ? ?Patient: Jacqueline Nash ? ?Procedure(s) Performed: TOTAL HIP ARTHROPLASTY (Left: Hip) ?KNEE INJECTION (Right: Knee) ? ?  ? ?Patient location during evaluation: PACU ?Anesthesia Type: Spinal ?Level of consciousness: awake and alert, patient cooperative and oriented ?Pain management: pain level controlled ?Vital Signs Assessment: post-procedure vital signs reviewed and stable ?Respiratory status: spontaneous breathing, nonlabored ventilation and respiratory function stable ?Cardiovascular status: blood pressure returned to baseline and stable ?Postop Assessment: no apparent nausea or vomiting, spinal receding and patient able to bend at knees ?Anesthetic complications: no ? ? ?No notable events documented. ? ?Last Vitals:  ?Vitals:  ? 06/23/21 1715 06/23/21 1730  ?BP: 140/75 135/71  ?Pulse: (!) 57 (!) 57  ?Resp: 13 15  ?Temp: (!) 36.3 ?C (!) 36.3 ?C  ?SpO2: 100% 100%  ?  ?Last Pain:  ?Vitals:  ? 06/23/21 1715  ?TempSrc:   ?PainSc: Asleep  ? ? ?  ?  ?  ?  ?  ?  ? ?Rasha Ibe,E. Bianka Liberati ? ? ? ? ?

## 2021-06-23 NOTE — Anesthesia Preprocedure Evaluation (Addendum)
Anesthesia Evaluation  ?Patient identified by MRN, date of birth, ID band ?Patient awake ? ? ? ?Reviewed: ?Allergy & Precautions, NPO status , Patient's Chart, lab work & pertinent test results ? ?Airway ?Mallampati: I ? ?TM Distance: >3 FB ?Neck ROM: Full ? ? ? Dental ?no notable dental hx. ? ?  ?Pulmonary ?former smoker,  ?  ?Pulmonary exam normal ?breath sounds clear to auscultation ? ? ? ? ? ? Cardiovascular ?hypertension, Normal cardiovascular exam ?Rhythm:Regular Rate:Normal ? ? ?  ?Neuro/Psych ? Headaches, PSYCHIATRIC DISORDERS Depression  Neuromuscular disease   ? GI/Hepatic ?GERD  Medicated and Controlled,  ?Endo/Other  ?Hypothyroidism  ? Renal/GU ?negative Renal ROS  ? ?  ?Musculoskeletal ?Scoliosis  ? Abdominal ?  ?Peds ? Hematology ?negative hematology ROS ?(+)   ?Anesthesia Other Findings ?OSTEOARTHRITIS  LEFT HIP ?RIGHT KNEE OSTEOARTHRITIS ? Reproductive/Obstetrics ? ?  ? ? ? ? ? ? ? ? ? ? ? ? ? ?  ?  ? ? ? ? ? ? ?Anesthesia Physical ?Anesthesia Plan ? ?ASA: 2 ? ?Anesthesia Plan: Spinal  ? ?Post-op Pain Management:   ? ?Induction: Intravenous ? ?PONV Risk Score and Plan: 2 and Ondansetron, Dexamethasone and Treatment may vary due to age or medical condition ? ?Airway Management Planned: Simple Face Mask ? ?Additional Equipment:  ? ?Intra-op Plan:  ? ?Post-operative Plan:  ? ?Informed Consent: I have reviewed the patients History and Physical, chart, labs and discussed the procedure including the risks, benefits and alternatives for the proposed anesthesia with the patient or authorized representative who has indicated his/her understanding and acceptance.  ? ? ? ?Dental advisory given ? ?Plan Discussed with: CRNA ? ?Anesthesia Plan Comments:   ? ? ? ? ? ?Anesthesia Quick Evaluation ? ?

## 2021-06-23 NOTE — Discharge Instructions (Signed)
INSTRUCTIONS AFTER JOINT REPLACEMENT  ° °Remove items at home which could result in a fall. This includes throw rugs or furniture in walking pathways °ICE to the affected joint every three hours while awake for 30 minutes at a time, for at least the first 3-5 days, and then as needed for pain and swelling.  Continue to use ice for pain and swelling. You may notice swelling that will progress down to the foot and ankle.  This is normal after surgery.  Elevate your leg when you are not up walking on it.   °Continue to use the breathing machine you got in the hospital (incentive spirometer) which will help keep your temperature down.  It is common for your temperature to cycle up and down following surgery, especially at night when you are not up moving around and exerting yourself.  The breathing machine keeps your lungs expanded and your temperature down. ° ° °DIET:  As you were doing prior to hospitalization, we recommend a well-balanced diet. ° °DRESSING / WOUND CARE / SHOWERING ° °Keep the surgical dressing until follow up.  The dressing is water proof, so you can shower without any extra covering.  IF THE DRESSING FALLS OFF or the wound gets wet inside, change the dressing with sterile gauze.  Please use good hand washing techniques before changing the dressing.  Do not use any lotions or creams on the incision until instructed by your surgeon.   ° °ACTIVITY ° °Increase activity slowly as tolerated, but follow the weight bearing instructions below.   °No driving for 6 weeks or until further direction given by your physician.  You cannot drive while taking narcotics.  °No lifting or carrying greater than 10 lbs. until further directed by your surgeon. °Avoid periods of inactivity such as sitting longer than an hour when not asleep. This helps prevent blood clots.  °You may return to work once you are authorized by your doctor.  ° ° ° °WEIGHT BEARING  ° °Weight bearing as tolerated with assist device (walker, cane,  etc) as directed, use it as long as suggested by your surgeon or therapist, typically at least 4-6 weeks. ° ° °EXERCISES ° °Results after joint replacement surgery are often greatly improved when you follow the exercise, range of motion and muscle strengthening exercises prescribed by your doctor. Safety measures are also important to protect the joint from further injury. Any time any of these exercises cause you to have increased pain or swelling, decrease what you are doing until you are comfortable again and then slowly increase them. If you have problems or questions, call your caregiver or physical therapist for advice.  ° °Rehabilitation is important following a joint replacement. After just a few days of immobilization, the muscles of the leg can become weakened and shrink (atrophy).  These exercises are designed to build up the tone and strength of the thigh and leg muscles and to improve motion. Often times heat used for twenty to thirty minutes before working out will loosen up your tissues and help with improving the range of motion but do not use heat for the first two weeks following surgery (sometimes heat can increase post-operative swelling).  ° °These exercises can be done on a training (exercise) mat, on the floor, on a table or on a bed. Use whatever works the best and is most comfortable for you.    Use music or television while you are exercising so that the exercises are a pleasant break in your   day. This will make your life better with the exercises acting as a break in your routine that you can look forward to.   Perform all exercises about fifteen times, three times per day or as directed.  You should exercise both the operative leg and the other leg as well.  Exercises include:   Quad Sets - Tighten up the muscle on the front of the thigh (Quad) and hold for 5-10 seconds.   Straight Leg Raises - With your knee straight (if you were given a brace, keep it on), lift the leg to 60  degrees, hold for 3 seconds, and slowly lower the leg.  Perform this exercise against resistance later as your leg gets stronger.  Leg Slides: Lying on your back, slowly slide your foot toward your buttocks, bending your knee up off the floor (only go as far as is comfortable). Then slowly slide your foot back down until your leg is flat on the floor again.  Angel Wings: Lying on your back spread your legs to the side as far apart as you can without causing discomfort.  Hamstring Strength:  Lying on your back, push your heel against the floor with your leg straight by tightening up the muscles of your buttocks.  Repeat, but this time bend your knee to a comfortable angle, and push your heel against the floor.  You may put a pillow under the heel to make it more comfortable if necessary.   A rehabilitation program following joint replacement surgery can speed recovery and prevent re-injury in the future due to weakened muscles. Contact your doctor or a physical therapist for more information on knee rehabilitation.    CONSTIPATION  Constipation is defined medically as fewer than three stools per week and severe constipation as less than one stool per week.  Even if you have a regular bowel pattern at home, your normal regimen is likely to be disrupted due to multiple reasons following surgery.  Combination of anesthesia, postoperative narcotics, change in appetite and fluid intake all can affect your bowels.   YOU MUST use at least one of the following options; they are listed in order of increasing strength to get the job done.  They are all available over the counter, and you may need to use some, POSSIBLY even all of these options:    Drink plenty of fluids (prune juice may be helpful) and high fiber foods Colace 100 mg by mouth twice a day  Senokot for constipation as directed and as needed Dulcolax (bisacodyl), take with full glass of water  Miralax (polyethylene glycol) once or twice a day as  needed.  If you have tried all these things and are unable to have a bowel movement in the first 3-4 days after surgery call either your surgeon or your primary doctor.    If you experience loose stools or diarrhea, hold the medications until you stool forms back up.  If your symptoms do not get better within 1 week or if they get worse, check with your doctor.  If you experience "the worst abdominal pain ever" or develop nausea or vomiting, please contact the office immediately for further recommendations for treatment.   ITCHING:  If you experience itching with your medications, try taking only a single pain pill, or even half a pain pill at a time.  You can also use Benadryl over the counter for itching or also to help with sleep.   TED HOSE STOCKINGS:  Use stockings on both  legs until for at least 2 weeks or as directed by physician office. They may be removed at night for sleeping.  MEDICATIONS:  See your medication summary on the After Visit Summary that nursing will review with you.  You may have some home medications which will be placed on hold until you complete the course of blood thinner medication.  It is important for you to complete the blood thinner medication as prescribed.   Blood clot prevention (DVT Prophylaxis): After surgery you are at an increased risk for a blood clot. you were prescribed a blood thinner, Aspirin '81mg'$ , to be taken twice daily for a total of 4 weeks from surgery to help reduce your risk of getting a blood clot. This will help prevent a blood clot. Signs of a pulmonary embolus (blood clot in the lungs) include sudden short of breath, feeling lightheaded or dizzy, chest pain with a deep breath, rapid pulse rapid breathing. Signs of a blood clot in your arms or legs include new unexplained swelling and cramping, warm, red or darkened skin around the painful area. Please call the office or 911 right away if these signs or symptoms develop.  PRECAUTIONS:  If you  experience chest pain or shortness of breath - call 911 immediately for transfer to the hospital emergency department.   If you develop a fever greater that 101 F, purulent drainage from wound, increased redness or drainage from wound, foul odor from the wound/dressing, or calf pain - CONTACT YOUR SURGEON.                                                   FOLLOW-UP APPOINTMENTS:  If you do not already have a post-op appointment, please call the office for an appointment to be seen by your surgeon.  Guidelines for how soon to be seen are listed in your After Visit Summary, but are typically between 2-3 weeks after surgery.  OTHER INSTRUCTIONS:   POST-OPERATIVE OPIOID TAPER INSTRUCTIONS: It is important to wean off of your opioid medication as soon as possible. If you do not need pain medication after your surgery it is ok to stop day one. Opioids include: Codeine, Hydrocodone(Norco, Vicodin), Oxycodone(Percocet, oxycontin) and hydromorphone amongst others.  Long term and even short term use of opiods can cause: Increased pain response Dependence Constipation Depression Respiratory depression And more.  Withdrawal symptoms can include Flu like symptoms Nausea, vomiting And more Techniques to manage these symptoms Hydrate well Eat regular healthy meals Stay active Use relaxation techniques(deep breathing, meditating, yoga) Do Not substitute Alcohol to help with tapering If you have been on opioids for less than two weeks and do not have pain than it is ok to stop all together.  Plan to wean off of opioids This plan should start within one week post op of your joint replacement. Maintain the same interval or time between taking each dose and first decrease the dose.  Cut the total daily intake of opioids by one tablet each day Next start to increase the time between doses. The last dose that should be eliminated is the evening dose.   MAKE SURE YOU:  Understand these instructions.   Get help right away if you are not doing well or get worse.    Thank you for letting us be a part of your medical care team.  It  is a privilege we respect greatly.  We hope these instructions will help you stay on track for a fast and full recovery!

## 2021-06-23 NOTE — Anesthesia Procedure Notes (Addendum)
Spinal ? ?Patient location during procedure: OR ?Start time: 06/23/2021 1:40 PM ?End time: 06/23/2021 1:45 PM ?Reason for block: surgical anesthesia ?Staffing ?Performed: anesthesiologist  ?Anesthesiologist: Murvin Natal, MD ?Preanesthetic Checklist ?Completed: patient identified, IV checked, risks and benefits discussed, surgical consent, monitors and equipment checked, pre-op evaluation and timeout performed ?Spinal Block ?Patient position: sitting ?Prep: DuraPrep ?Patient monitoring: cardiac monitor, continuous pulse ox and blood pressure ?Approach: left paramedian ?Location: L4-5 ?Injection technique: single-shot ?Needle ?Needle type: Pencan  ?Needle gauge: 24 G ?Needle length: 9 cm ?Assessment ?Sensory level: T10 ?Events: CSF return ?Additional Notes ?Functioning IV was confirmed and monitors were applied. Sterile prep and drape, including hand hygiene and sterile gloves were used. The patient was positioned and the spine was prepped. The skin was anesthetized with lidocaine.  Free flow of clear CSF was obtained prior to injecting local anesthetic into the CSF.  The spinal needle aspirated freely following injection.  The needle was carefully withdrawn.  The patient tolerated the procedure well.  ? ? ? ?

## 2021-06-23 NOTE — Op Note (Signed)
06/23/2021 ? ?1:26 PM ? ?PATIENT:  Jacqueline Nash  ? ?MRN: [id number] ? ?PRE-OPERATIVE DIAGNOSIS: End-stage left hip osteoarthritis, severe right knee osteoarthritis ? ?POST-OPERATIVE DIAGNOSIS:  same ? ?PROCEDURE:  Procedure(s): ?LEFT TOTAL HIP ARTHROPLASTY ?RIGHT KNEE  STEROID INJECTION ? ?PREOPERATIVE INDICATIONS:   ? ?Jacqueline Nash is an 81 y.o. female who has a diagnosis of End-stage left hip osteoarthritis and elected for surgical management after failing conservative treatment.  The risks benefits and alternatives were discussed with the patient including but not limited to the risks of nonoperative treatment, versus surgical intervention including infection, bleeding, nerve injury, periprosthetic fracture, the need for revision surgery, dislocation, leg length discrepancy, blood clots, cardiopulmonary complications, morbidity, mortality, among others, and they were willing to proceed.   ? ? ?OPERATIVE REPORT  ?   ?SURGEON:  Charlies Constable, MD ?   ?ASSISTANT:  Izola Price, RNFA, (Present throughout the entire procedure,  necessary for completion of procedure in a timely manner, assisting with retraction, instrumentation, and closure)  ?   ?ANESTHESIA:  Spinal ? ?ESTIMATED BLOOD LOSS: 500cc ?   ?COMPLICATIONS:  None.  ?   ?UNIQUE ASPECTS OF THE CASE: Significant superior lateral acetabular wear, able to mobilize and have acceptable coverage superiorly with a primary Acetabular component ? ?COMPONENTS:   ?[employer]ker Trident 2 52 mm cup, Accolade 2 size 5 127 degree angle, dual mobility liner, 28+4 inner ball, 28/48 polyethylene outer ball ?Implant Name Type Inv. Item Serial No. Manufacturer Lot No. LRB No. Used Action  ?SHELL CLUSTERHOLE ACETABULAR 5 - [id number] Shell SHELL CLUSTERHOLE ACETABULAR 5  [employer] [id number] Left 1 Implanted  ?SCREW HEX LP 6.5X25 - [id number] Screw SCREW HEX LP 6.5X25  [employer] [id number] Left 1 Implanted  ?SCREW HEX LP 6.5X30 - [id number] Screw SCREW HEX LP  6.5X30  [employer] V4PH Left 1 Implanted  ?LINER 42MM E - [id number] Orthopedic Implant LINER 42MM E  [employer] [id number] Left 1 Implanted  ?STEM HIP 127 DEG - [id number] Stem STEM HIP 127 DEG  [employer] [id number] Left 1 Implanted  ?HIP BIOLOX HD 28/+4 - [id number] Joint HIP BIOLOX HD 28/+4  [employer] [id number] Left 1 Implanted  ?LINER ADM MDM INS 28/48 42E - [id number] Liner LINER ADM MDM INS 28/48 42E  [employer] [id number] Left 1 Implanted  ? ? ?   ?PROCEDURE IN DETAIL:  ? ?The patient was met in the holding area and  identified.  The appropriate hip was identified and marked at the operative site.  The patient was then transported to the OR  and  placed under anesthesia.  ? ?Using a supralateral approach the right knee was prepped, and injection was performed into the right knee joint with $RemoveBe'40mg'MzirwnrgS$  kenalog and 4cc 1/2 percent marcaine. This was well tolerated by the patient.  ? ? At that point, the patient was  placed in the lateral decubitus position with the operative side up and  secured to the operating room table  and all bony prominences padded. A subaxillary role was also placed. ?   ?The operative lower extremity was prepped from the iliac crest to the distal leg.  Sterile draping was performed.  Time out was performed prior to incision.   ?   ?A routine posterolateral approach was utilized via sharp dissection  carried down to the subcutaneous tissue.  Gross bleeders were Bovie coagulated.  The iliotibial band was identified and incised along the length of the skin incision through the glute max fascia.  Charnley  retractor was placed with care to protect the sciatic nerve posteriorly.  With the hip internally rotated, the piriformis tendon was identified and released from the femoral insertion and tagged with a #5 Ethibond.  A capsulotomy was then performed off the femoral insertion and also tagged with a #5 Ethibond.   ? ?The femoral neck was exposed, and I resected  the femoral neck based on preoperative templating relative to the lesser trochanter. ?   ?I then exposed the deep acetabulum, cleared out any tissue including the ligamentum teres.  After adequate visualization, I excised the labrum.  I then started reaming with a 46 mm reamer, first medializing to the floor of the cotyloid fossa, and then in the position of the cup aiming towards the greater sciatic notch, matching the version of the transverse acetabular ligament and tucked under the anterior wall. I reamed up to 52 mm reamer with good bony bed preparation and a 52 mm cup was chosen.  The real cup was then impacted into place.  Appropriate version and inclination was confirmed clinically matching their bony anatomy, and also with the use of the jig.  I placed 2 screws in the posterior superior quadrant to augment fixation. ? ?Given the patient's severe scoliosis and spinal stiffness elected to use a dual mobility liner. A MDM liner was placed and impacted. It was confirmed to be appropriately seated and the acetabular retractors were removed. ?   ?I then prepared the proximal femur using the box cutter, Charnley awl, and then sequentially broached starting with 0 up to a size 5. ? ?A trial broach, neck, and head was utilized, and I reduced the hip and it was found to have excellent stability.  Initially with a +0 inner ball had impingement posteriorly as well as dislocation at 90 degrees flexion and approximately 60 degrees internal rotation.  Elected to use a +4 inner ball.  With this trial, there was no impingement with full extension and 90 degrees external rotation.  The hip was stable at the position of sleep and with 90 degrees flexion and greater than 80 degrees of internal rotation.  Leg lengths were also clinically assessed in the lateral position and felt to be equal. ? ?A final femoral prosthesis size 5 was selected. I then impacted the real femoral prosthesis into place.I again trialed and selected a +  4 ball. and I impacted the real head ball into place. The hip was then reduced and taken through a range of motion. There was no impingement with full extension and 90 degrees external rotation.  The hip was stable at the position of sleep and with 90 degrees flexion and 80 degrees of internal rotation. Leg lengths were  again assessed and felt to be restored. ? ?The posterior capsule was then closed with #5 Ethibond.  The piriformis was repaired through the base of the abductor tendon using a [place] suture passer. ? ?I then irrigated the hip copiously again with pulse lavage. Periarticular injection was then performed with Exparel.  Intraop flat plate xray was obtained and components were confirmed to be in good position without fracture. We repaired the fascia #1 barbed suture, followed by 0 barbed suture for the subcutaneous fat.  Skin was closed with 2-0 Vicryl and 3-0 Monocryl.  Dermabond and Aquacel dressing were applied. The patient was then awakened and returned to PACU in stable and satisfactory condition.  Leg lengths in the supine position were assessed and felt to be clinically equal. There were no  complications. ? ?Post op recs: ?WB: WBAT LLE, posterior hip precautions x6 weeks ?Abx: ancef x23 hours post op ?Imaging: PACU pelvis Xray ?Dressing: Aquacell, keep intact until follow up ?DVT prophylaxis: Aspirin 81BID starting POD1 ?Follow up: 2 weeks after surgery for a wound check with Dr. Zachery Dakins at [facility].  ?Address: [address] [place], [place], [place] [address]  ?Office Phone: [phone] ? ? ?Charlies Constable, MD ?Orthopedic Surgeon ? ? ? ?  ?

## 2021-06-23 NOTE — Plan of Care (Signed)
Plan of care reviewed and discussed with the patient. 

## 2021-06-23 NOTE — Interval H&P Note (Signed)
The patient has been re-examined, and the chart reviewed, and there have been no interval changes to the documented history and physical.   ? ?Plan for Left THA today. ? ?The operative side was examined and the patient was confirmed to have. Sens DPN, SPN, TN intact, Motor EHL, ext, flex 5/5, and DP 2+, PT 2+, No significant edema. ? ? ?The risks, benefits, and alternatives have been discussed at length with patient, and the patient is willing to proceed.  Left leg marked. Consent has been signed. ?

## 2021-06-23 NOTE — Transfer of Care (Signed)
Immediate Anesthesia Transfer of Care Note ? ?Patient: Jacqueline Nash ? ?Procedure(s) Performed: TOTAL HIP ARTHROPLASTY (Left: Hip) ?KNEE INJECTION (Right: Knee) ? ?Patient Location: PACU ? ?Anesthesia Type:Spinal ? ?Level of Consciousness: awake, alert  and oriented ? ?Airway & Oxygen Therapy: Patient Spontanous Breathing and Patient connected to face mask oxygen ? ?Post-op Assessment: Report given to RN and Post -op Vital signs reviewed and stable ? ?Post vital signs: Reviewed and stable ? ?Last Vitals:  ?Vitals Value Taken Time  ?BP 92/79 06/23/21 1615  ?Temp    ?Pulse 62 06/23/21 1616  ?Resp 16 06/23/21 1616  ?SpO2 100 % 06/23/21 1616  ?Vitals shown include unvalidated device data. ? ?Last Pain:  ?Vitals:  ? 06/23/21 1157  ?TempSrc: Oral  ?PainSc: 0-No pain  ?   ? ?  ? ?Complications: No notable events documented. ?

## 2021-06-24 ENCOUNTER — Encounter (HOSPITAL_COMMUNITY): Payer: Self-pay | Admitting: Orthopedic Surgery

## 2021-06-24 DIAGNOSIS — M25552 Pain in left hip: Secondary | ICD-10-CM | POA: Diagnosis present

## 2021-06-24 DIAGNOSIS — M810 Age-related osteoporosis without current pathological fracture: Secondary | ICD-10-CM | POA: Diagnosis present

## 2021-06-24 DIAGNOSIS — Z885 Allergy status to narcotic agent status: Secondary | ICD-10-CM | POA: Diagnosis not present

## 2021-06-24 DIAGNOSIS — M1612 Unilateral primary osteoarthritis, left hip: Secondary | ICD-10-CM | POA: Diagnosis present

## 2021-06-24 DIAGNOSIS — E785 Hyperlipidemia, unspecified: Secondary | ICD-10-CM | POA: Diagnosis present

## 2021-06-24 DIAGNOSIS — Z91018 Allergy to other foods: Secondary | ICD-10-CM | POA: Diagnosis not present

## 2021-06-24 DIAGNOSIS — Z20822 Contact with and (suspected) exposure to covid-19: Secondary | ICD-10-CM | POA: Diagnosis present

## 2021-06-24 DIAGNOSIS — Z791 Long term (current) use of non-steroidal anti-inflammatories (NSAID): Secondary | ICD-10-CM | POA: Diagnosis not present

## 2021-06-24 DIAGNOSIS — Z87891 Personal history of nicotine dependence: Secondary | ICD-10-CM | POA: Diagnosis not present

## 2021-06-24 DIAGNOSIS — Z882 Allergy status to sulfonamides status: Secondary | ICD-10-CM | POA: Diagnosis not present

## 2021-06-24 DIAGNOSIS — Z79899 Other long term (current) drug therapy: Secondary | ICD-10-CM | POA: Diagnosis not present

## 2021-06-24 DIAGNOSIS — I10 Essential (primary) hypertension: Secondary | ICD-10-CM | POA: Diagnosis present

## 2021-06-24 DIAGNOSIS — Z79890 Hormone replacement therapy (postmenopausal): Secondary | ICD-10-CM | POA: Diagnosis not present

## 2021-06-24 DIAGNOSIS — F32A Depression, unspecified: Secondary | ICD-10-CM | POA: Diagnosis present

## 2021-06-24 DIAGNOSIS — M1711 Unilateral primary osteoarthritis, right knee: Secondary | ICD-10-CM | POA: Diagnosis present

## 2021-06-24 DIAGNOSIS — J309 Allergic rhinitis, unspecified: Secondary | ICD-10-CM | POA: Diagnosis present

## 2021-06-24 DIAGNOSIS — E039 Hypothyroidism, unspecified: Secondary | ICD-10-CM | POA: Diagnosis present

## 2021-06-24 DIAGNOSIS — M419 Scoliosis, unspecified: Secondary | ICD-10-CM | POA: Diagnosis present

## 2021-06-24 DIAGNOSIS — Z85850 Personal history of malignant neoplasm of thyroid: Secondary | ICD-10-CM | POA: Diagnosis not present

## 2021-06-24 DIAGNOSIS — G43909 Migraine, unspecified, not intractable, without status migrainosus: Secondary | ICD-10-CM | POA: Diagnosis present

## 2021-06-24 LAB — BASIC METABOLIC PANEL
Anion gap: 9 (ref 5–15)
BUN: 21 mg/dL (ref 8–23)
CO2: 25 mmol/L (ref 22–32)
Calcium: 9.1 mg/dL (ref 8.9–10.3)
Chloride: 106 mmol/L (ref 98–111)
Creatinine, Ser: 0.91 mg/dL (ref 0.44–1.00)
GFR, Estimated: >60 mL/min (ref >60)
Glucose, Bld: 194 mg/dL — ABNORMAL HIGH (ref 70–99)
Potassium: 4.1 mmol/L (ref 3.5–5.1)
Sodium: 140 mmol/L (ref 135–145)

## 2021-06-24 LAB — CBC
HCT: 33.9 % — ABNORMAL LOW (ref 36.0–46.0)
Hemoglobin: 11.1 g/dL — ABNORMAL LOW (ref 12.0–15.0)
MCH: 30.7 pg (ref 26.0–34.0)
MCHC: 32.7 g/dL (ref 30.0–36.0)
MCV: 93.6 fL (ref 80.0–100.0)
Platelets: 213 10*3/uL (ref 150–400)
RBC: 3.62 MIL/uL — ABNORMAL LOW (ref 3.87–5.11)
RDW: 13.3 % (ref 11.5–15.5)
WBC: 8 10*3/uL (ref 4.0–10.5)
nRBC: 0 % (ref 0.0–0.2)

## 2021-06-24 MED ORDER — ACETAMINOPHEN 500 MG PO TABS
1000.0000 mg | ORAL_TABLET | Freq: Three times a day (TID) | ORAL | Status: AC
Start: 2021-06-24 — End: 2021-06-25
  Administered 2021-06-24 – 2021-06-25 (×4): 1000 mg via ORAL
  Filled 2021-06-24 (×4): qty 2

## 2021-06-24 NOTE — Evaluation (Signed)
Physical Therapy Evaluation ?Patient Details ?Name: Jacqueline Nash ?MRN: [id number] ?DOB: 07-28-1940 ?Today's Date: 06/24/2021 ? ?History of Present Illness ? 81 yo female s/p L THA-posterior, R knee cortisone injection 06/23/21. Hx of scoliosis, peripheral neuropathy, ostopenia  ?Clinical Impression ? On eval, pt required Min A for mobility. She walked ~10 feet around the room this session. Distance was limited by pain and fatigue. Pain rated 8/10 with activity. Pt is at risk for falls when mobilizing. She tends to maintain a flexed trunk and knee posture with vaulting on L when ambulating. Will continue to follow and progress activity as tolerated. Placed an order for OT consult for ADL education.    ?   ? ?Recommendations for follow up therapy are one component of a multi-disciplinary discharge planning process, led by the attending physician.  Recommendations may be updated based on patient status, additional functional criteria and insurance authorization. ? ?Follow Up Recommendations Follow physician's recommendations for discharge plan and follow up therapies ? ?  ?Assistance Recommended at Discharge Frequent or constant Supervision/Assistance  ?Patient can return home with the following ? A little help with walking and/or transfers;A little help with bathing/dressing/bathroom;Assistance with cooking/housework;Assist for transportation;Help with stairs or ramp for entrance ? ?  ?Equipment Recommendations None recommended by PT  ?Recommendations for Other Services ?    ?  ?Functional Status Assessment Patient has had a recent decline in their functional status and demonstrates the ability to make significant improvements in function in a reasonable and predictable amount of time.  ? ?  ?Precautions / Restrictions Precautions ?Precautions: Posterior Hip ?Precaution Booklet Issued: Yes (comment) ?Restrictions ?Weight Bearing Restrictions: No ?Other Position/Activity Restrictions: WBAT  ? ?  ? ?Mobility ? Bed  Mobility ?Overal bed mobility: Needs Assistance ?Bed Mobility: Supine to Sit ?  ?  ?Supine to sit: Min assist, HOB elevated ?  ?  ?General bed mobility comments: Assist for L LE. Cues for safety, adherence to hip precautions. Increased time. ?  ? ?Transfers ?Overall transfer level: Needs assistance ?Equipment used: Rolling walker (2 wheels) ?Transfers: Sit to/from Stand ?Sit to Stand: From elevated surface, Mod assist ?  ?  ?  ?  ?  ?General transfer comment: Assist to rise, stabilize, hand/LE placement. Cues for safety, technique, hand/LE placement, adherence to precautions. ?  ? ?Ambulation/Gait ?Ambulation/Gait assistance: Min assist ?Gait Distance (Feet): 10 Feet ?Assistive device: Rolling walker (2 wheels) ?Gait Pattern/deviations: Step-to pattern, Trunk flexed, Knee flexed in stance - right, Knee flexed in stance - left, Wide base of support ?  ?  ?  ?General Gait Details: Cues for posture (trunk and knees), L LE placement (narrow vs wide ), sequence, proper use of RW. Pt is vaulting over L LE. Assist to stabilize/support pt throughout distance. Fatigues fairly easily. Fall risk. ? ?Stairs ?  ?  ?  ?  ?  ? ?Wheelchair Mobility ?  ? ?Modified Rankin (Stroke Patients Only) ?  ? ?  ? ?Balance Overall balance assessment: Needs assistance ?  ?  ?  ?  ?Standing balance support: Bilateral upper extremity supported, Reliant on assistive device for balance, During functional activity ?Standing balance-Leahy Scale: Poor ?  ?  ?  ?  ?  ?  ?  ?  ?  ?  ?  ?  ?   ? ? ? ?Pertinent Vitals/Pain Pain Assessment ?Pain Assessment: 0-10 ?Pain Score: 8  ?Pain Location: L hip, R knee ?Pain Descriptors / Indicators: Discomfort, Sore, Aching ?Pain Intervention(s): Limited activity within  patient's tolerance, Monitored during session, Repositioned, Ice applied  ? ? ?Home Living Family/patient expects to be discharged to:: Private residence ?Living Arrangements: Alone ?Available Help at Discharge: Family ?Type of Home: House ?Home  Access: Stairs to enter ?Entrance Stairs-Rails: Right ?Entrance Stairs-Number of Steps: 2 ?  ?Home Layout: One level ?Home Equipment: Conservation officer, nature (2 wheels);Rollator (4 wheels);Cane - single point ?   ?  ?Prior Function Prior Level of Function : Independent/Modified Independent ?  ?  ?  ?  ?  ?  ?Mobility Comments: using walker for amblation ?  ?  ? ? ?Hand Dominance  ?   ? ?  ?Extremity/Trunk Assessment  ? Upper Extremity Assessment ?Upper Extremity Assessment: Defer to OT evaluation ?  ? ?Lower Extremity Assessment ?Lower Extremity Assessment: Generalized weakness ?  ? ?Cervical / Trunk Assessment ?Cervical / Trunk Assessment: Kyphotic (scoliosis)  ?Communication  ? Communication: No difficulties  ?Cognition Arousal/Alertness: Awake/alert ?Behavior During Therapy: [facility] for tasks assessed/performed ?Overall Cognitive Status: Within Functional Limits for tasks assessed ?  ?  ?  ?  ?  ?  ?  ?  ?  ?  ?  ?  ?  ?  ?  ?  ?  ?  ?  ? ?  ?General Comments   ? ?  ?Exercises    ? ?Assessment/Plan  ?  ?PT Assessment Patient needs continued PT services  ?PT Problem List Decreased strength;Decreased mobility;Decreased range of motion;Decreased activity tolerance;Decreased balance;Decreased knowledge of use of DME;Pain ? ?   ?  ?PT Treatment Interventions DME instruction;Therapeutic exercise;Gait training;Balance training;Stair training;Functional mobility training;Therapeutic activities;Patient/family education   ? ?PT Goals (Current goals can be found in the Care Plan section)  ?Acute Rehab PT Goals ?Patient Stated Goal: less pain. regain independence ?PT Goal Formulation: With patient ?Time For Goal Achievement: 07/08/21 ?Potential to Achieve Goals: Good ? ?  ?Frequency 7X/week ?  ? ? ?Co-evaluation   ?  ?  ?  ?  ? ? ?  ?AM-PAC PT "6 Clicks" Mobility  ?Outcome Measure Help needed turning from your back to your side while in a flat bed without using bedrails?: A Little ?Help needed moving from lying on your back to sitting on  the side of a flat bed without using bedrails?: A Little ?Help needed moving to and from a bed to a chair (including a wheelchair)?: A Little ?Help needed standing up from a chair using your arms (e.g., wheelchair or bedside chair)?: A Little ?Help needed to walk in hospital room?: A Lot ?Help needed climbing 3-5 steps with a railing? : A Lot ?6 Click Score: 16 ? ?  ?End of Session Equipment Utilized During Treatment: Gait belt ?Activity Tolerance: Patient tolerated treatment well;Patient limited by fatigue ?Patient left: in chair;with call bell/phone within reach ?  ?PT Visit Diagnosis: Other abnormalities of gait and mobility (R26.89);Pain ?Pain - Right/Left: Left ?Pain - part of body: Hip ?  ? ?Time: 3710-6269 ?PT Time Calculation (min) (ACUTE ONLY): 25 min ? ? ?Charges:   PT Evaluation ?$PT Eval Low Complexity: 1 Low ?PT Treatments ?$Gait Training: 8-22 mins ?  ?   ? ? ? ? ?Doreatha Massed, PT ?Acute Rehabilitation  ?Office: [phone] ?Pager: [phone] ? ?  ? ?

## 2021-06-24 NOTE — TOC Transition Note (Signed)
Transition of Care (TOC) - CM/SW Discharge Note ? ? ?Patient Details  ?Name: Jacqueline Nash ?MRN: [id number] ?Date of Birth: 03/15/1941 ? ?Transition of Care (TOC) CM/SW Contact:  ?HOYLE, LUCY, LCSW ?Phone Number: ?06/24/2021, 9:27 AM ? ? ?Clinical Narrative:    ?Met with pt who confirms she has all needed DME at home.  Notes OPPT set up at [facility].  No TOC needs. ? ? ?Final next level of care: OP Rehab ?Barriers to Discharge: No Barriers Identified ? ? ?Patient Goals and CMS Choice ?Patient states their goals for this hospitalization and ongoing recovery are:: return home ?  ?  ? ?Discharge Placement ?  ?           ?  ?  ?  ?  ? ?Discharge Plan and Services ?  ?  ?           ?DME Arranged: N/A ?DME Agency: NA ?  ?  ?  ?  ?  ?  ?  ?  ? ?Social Determinants of Health (SDOH) Interventions ?  ? ? ?Readmission Risk Interventions ?No flowsheet data found. ? ? ? ? ?

## 2021-06-24 NOTE — Progress Notes (Signed)
Physical Therapy Treatment ?Patient Details ?Name: Jacqueline Nash ?MRN: [id number] ?DOB: 08-24-1940 ?Today's Date: 06/24/2021 ? ? ?History of Present Illness 81 yo female s/p L THA-posterior, R knee cortisone injection 06/23/21. Hx of scoliosis, peripheral neuropathy, ostopenia ? ?  ?PT Comments  ? ? Progressing with mobility. Pain rated 6/10 this afternoon. Had pt wear shoe on R foot while ambulating this afternoon. Will continue gait and stair training on tomorrow.    ?Recommendations for follow up therapy are one component of a multi-disciplinary discharge planning process, led by the attending physician.  Recommendations may be updated based on patient status, additional functional criteria and insurance authorization. ? ?Follow Up Recommendations ? Follow physician's recommendations for discharge plan and follow up therapies ?  ?  ?Assistance Recommended at Discharge Frequent or constant Supervision/Assistance  ?Patient can return home with the following A little help with walking and/or transfers;A little help with bathing/dressing/bathroom;Assistance with cooking/housework;Assist for transportation;Help with stairs or ramp for entrance ?  ?Equipment Recommendations ? None recommended by PT  ?  ?Recommendations for Other Services   ? ? ?  ?Precautions / Restrictions Precautions ?Precautions: Posterior Hip ?Precaution Booklet Issued: Yes (comment) ?Restrictions ?Weight Bearing Restrictions: No ?Other Position/Activity Restrictions: WBAT  ?  ? ?Mobility ? Bed Mobility ?Overal bed mobility: Needs Assistance ?Bed Mobility: Sit to Supine ?  ?  ? ?Sit to supine: Min assist ?  ?General bed mobility comments: Assist for L LE. Cues for safety, adherence to hip precautions. Increased time. ?  ? ?Transfers ?Overall transfer level: Needs assistance ?Equipment used: Rolling walker (2 wheels) ?Transfers: Sit to/from Stand ?Sit to Stand: Min assist ?  ?  ?  ?  ?  ?General transfer comment: Assist to rise, stabilize,  hand/LE placement. Cues for safety, technique, hand/LE placement, adherence to precautions. ?  ? ?Ambulation/Gait ?Ambulation/Gait assistance: Min assist ?Gait Distance (Feet): 50 Feet (50'x1, 15'x1) ?Assistive device: Rolling walker (2 wheels) ?Gait Pattern/deviations: Step-to pattern, Trunk flexed, Knee flexed in stance - right, Knee flexed in stance - left, Wide base of support ?  ?  ?  ?General Gait Details: Cues for posture (trunk and knees), L LE placement (narrow vs wide ), sequence, proper use of RW. Assist to stabilize/support pt throughout distance. Fatigues fairly easily. Fall risk. Had pt wear shoe on R foot this afternoon. ? ? ?Stairs ?  ?  ?  ?  ?  ? ? ?Wheelchair Mobility ?  ? ?Modified Rankin (Stroke Patients Only) ?  ? ? ?  ?Balance Overall balance assessment: Needs assistance ?  ?  ?  ?  ?Standing balance support: Bilateral upper extremity supported, Reliant on assistive device for balance, During functional activity ?Standing balance-Leahy Scale: Poor ?  ?  ?  ?  ?  ?  ?  ?  ?  ?  ?  ?  ?  ? ?  ?Cognition Arousal/Alertness: Awake/alert ?Behavior During Therapy: [facility] for tasks assessed/performed ?Overall Cognitive Status: Within Functional Limits for tasks assessed ?  ?  ?  ?  ?  ?  ?  ?  ?  ?  ?  ?  ?  ?  ?  ?  ?  ?  ?  ? ?  ?Exercises   ? ?  ?General Comments   ?  ?  ? ?Pertinent Vitals/Pain Pain Assessment ?Pain Assessment: 0-10 ?Pain Score: 6  ?Pain Location: L hip ?Pain Descriptors / Indicators: Discomfort, Sore, Aching ?Pain Intervention(s): Monitored during session, Limited activity within patient's  tolerance, Repositioned  ? ? ?Home Living Family/patient expects to be discharged to:: Private residence ?Living Arrangements: Alone ?Available Help at Discharge: Family ?Type of Home: House ?Home Access: Stairs to enter ?Entrance Stairs-Rails: Right ?Entrance Stairs-Number of Steps: 2 ?  ?Home Layout: One level ?Home Equipment: Conservation officer, nature (2 wheels);Rollator (4 wheels);Cane - single point ?    ?  ?Prior Function    ?  ?  ?   ? ?PT Goals (current goals can now be found in the care plan section) Acute Rehab PT Goals ?Patient Stated Goal: less pain. regain independence ?PT Goal Formulation: With patient ?Time For Goal Achievement: 07/08/21 ?Potential to Achieve Goals: Good ?Progress towards PT goals: Progressing toward goals ? ?  ?Frequency ? ? ? 7X/week ? ? ? ?  ?PT Plan Current plan remains appropriate  ? ? ?Co-evaluation   ?  ?  ?  ?  ? ?  ?AM-PAC PT "6 Clicks" Mobility   ?Outcome Measure ? Help needed turning from your back to your side while in a flat bed without using bedrails?: A Little ?Help needed moving from lying on your back to sitting on the side of a flat bed without using bedrails?: A Little ?Help needed moving to and from a bed to a chair (including a wheelchair)?: A Little ?Help needed standing up from a chair using your arms (e.g., wheelchair or bedside chair)?: A Little ?Help needed to walk in hospital room?: A Little ?Help needed climbing 3-5 steps with a railing? : A Lot ?6 Click Score: 17 ? ?  ?End of Session Equipment Utilized During Treatment: Gait belt ?Activity Tolerance: Patient tolerated treatment well;Patient limited by fatigue ?Patient left: in bed;with call bell/phone within reach;with bed alarm set ?  ?PT Visit Diagnosis: Other abnormalities of gait and mobility (R26.89);Pain ?Pain - Right/Left: Left ?Pain - part of body: Hip ?  ? ? ?Time: 4696-2952 ?PT Time Calculation (min) (ACUTE ONLY): 29 min ? ?Charges:  $Gait Training: 23-37 mins          ?          ? ? ? ? ?Doreatha Massed, PT ?Acute Rehabilitation  ?Office: [phone] ?Pager: [phone] ? ?  ? ?

## 2021-06-24 NOTE — Progress Notes (Signed)
? ? ? ?  Subjective: ? ?Patient reports pain as well controlled. Denies distal n/t. Hasn't worked with PT yet. No issues or concerns. ? ?Objective:  ? ?VITALS:   ?Vitals:  ? 06/23/21 1745 06/23/21 1951 06/24/21 0123 06/24/21 0552  ?BP: (!) 143/77 109/66 (!) 94/49 (!) 109/58  ?Pulse: (!) 57 75 71 77  ?Resp: '16 16 16 16  '$ ?Temp: 97.6 ?F (36.4 ?C) (!) 97.5 ?F (36.4 ?C) 97.8 ?F (36.6 ?C) 97.7 ?F (36.5 ?C)  ?TempSrc: Oral Oral Oral Oral  ?SpO2: 100% 99% 97% 92%  ?Weight: 65 kg     ?Height: '5\' 2"'$  (1.575 m)     ? ? ?Sensation intact distally ?Intact pulses distally ?Dorsiflexion/Plantar flexion intact ?Incision: dressing C/D/I ?Compartment soft ? ? ?Lab Results  ?Component Value Date  ? WBC 8.0 06/24/2021  ? HGB 11.1 (L) 06/24/2021  ? HCT 33.9 (L) 06/24/2021  ? MCV 93.6 06/24/2021  ? PLT 213 06/24/2021  ? ?BMET ?   ?Component Value Date/Time  ? NA 140 06/24/2021 0255  ? K 4.1 06/24/2021 0255  ? CL 106 06/24/2021 0255  ? CO2 25 06/24/2021 0255  ? GLUCOSE 194 (H) 06/24/2021 0255  ? BUN 21 06/24/2021 0255  ? CREATININE 0.91 06/24/2021 0255  ? CALCIUM 9.1 06/24/2021 0255  ? GFRNONAA >60 06/24/2021 0255  ? ? ? ? ?Xray: xrays with components in good position no adverse features, no fx or dislocation ? ?Assessment/Plan: ?1 Day Post-Op  ? ?Principal Problem: ?  Osteoarthritis of left hip ? ?L THA 3/14 ? ?  ?Post op recs: ?WB: WBAT LLE, posterior hip precautions x6 weeks ?Abx: ancef x23 hours post op ?Imaging: PACU pelvis Xray ?Dressing: Aquacell, keep intact until follow up ?DVT prophylaxis: Aspirin 81BID starting POD1 ?Follow up: 2 weeks after surgery for a wound check with Dr. Zachery Dakins at [facility].  ?Address: [address] [place], [place], [place] [address]  ?Office Phone: [phone] ?  ? ? ? ?Tricia Pledger A Terreon Ekholm ?06/24/2021, 7:29 AM ? ? ?Charlies Constable, MD ? ?Contact information:   ?Weekdays 7am-5pm epic message Dr. Zachery Dakins, or call office for patient follow up: [phone] ?After hours and holidays  please check [email or web address] for group call information for [facility] ? ?  ?

## 2021-06-25 LAB — CBC
HCT: 33.6 % — ABNORMAL LOW (ref 36.0–46.0)
Hemoglobin: 10.4 g/dL — ABNORMAL LOW (ref 12.0–15.0)
MCH: 30.7 pg (ref 26.0–34.0)
MCHC: 31 g/dL (ref 30.0–36.0)
MCV: 99.1 fL (ref 80.0–100.0)
Platelets: 197 10*3/uL (ref 150–400)
RBC: 3.39 MIL/uL — ABNORMAL LOW (ref 3.87–5.11)
RDW: 13.8 % (ref 11.5–15.5)
WBC: 10.5 10*3/uL (ref 4.0–10.5)
nRBC: 0 % (ref 0.0–0.2)

## 2021-06-25 MED ORDER — OXYCODONE HCL 5 MG PO TABS: 5.0000 mg | ORAL_TABLET | ORAL | 0 refills | Status: AC | PRN

## 2021-06-25 MED ORDER — ONDANSETRON HCL 4 MG PO TABS: 4.0000 mg | ORAL_TABLET | Freq: Three times a day (TID) | ORAL | 0 refills | Status: AC | PRN

## 2021-06-25 MED ORDER — ACETAMINOPHEN 500 MG PO TABS: 1000.0000 mg | ORAL_TABLET | Freq: Three times a day (TID) | ORAL | 0 refills | Status: AC | PRN

## 2021-06-25 MED ORDER — ASPIRIN EC 81 MG PO TBEC: 81.0000 mg | DELAYED_RELEASE_TABLET | Freq: Two times a day (BID) | ORAL | 0 refills | Status: AC

## 2021-06-25 NOTE — Progress Notes (Signed)
Physical Therapy Treatment ?Patient Details ?Name: Jacqueline Nash ?MRN: [id number] ?DOB: 1941-03-07 ?Today's Date: 06/25/2021 ? ? ?History of Present Illness 81 yo female s/p L THA-posterior, R knee cortisone injection 06/23/21. Hx of scoliosis, peripheral neuropathy, ostopenia ? ?  ?PT Comments  ? ? Progressing well with mobility. Plan is for d/c home later today after 2nd session to practice stair negotiation.    ?Recommendations for follow up therapy are one component of a multi-disciplinary discharge planning process, led by the attending physician.  Recommendations may be updated based on patient status, additional functional criteria and insurance authorization. ? ?Follow Up Recommendations ? Follow physician's recommendations for discharge plan and follow up therapies ?  ?  ?Assistance Recommended at Discharge Frequent or constant Supervision/Assistance  ?Patient can return home with the following A little help with walking and/or transfers;A little help with bathing/dressing/bathroom;Assistance with cooking/housework;Assist for transportation;Help with stairs or ramp for entrance ?  ?Equipment Recommendations ? None recommended by PT  ?  ?Recommendations for Other Services   ? ? ?  ?Precautions / Restrictions Precautions ?Precautions: Posterior Hip ?Precaution Booklet Issued: Yes (comment) ?Restrictions ?Weight Bearing Restrictions: No ?Other Position/Activity Restrictions: WBAT  ?  ? ?Mobility ? Bed Mobility ?  ?  ?  ?  ?  ?  ?  ?General bed mobility comments: oob in recliner ?  ? ?Transfers ?Overall transfer level: Needs assistance ?Equipment used: Rolling walker (2 wheels) ?Transfers: Sit to/from Stand ?Sit to Stand: Min guard ?  ?  ?  ?  ?  ?General transfer comment: Min guard for safety. Cues for safety, technique, hand/LE placement, adherence to precautions ?  ? ?Ambulation/Gait ?  ?Gait Distance (Feet): 50 Feet ?Assistive device: Rolling walker (2 wheels) ?Gait Pattern/deviations: Step-to pattern,  Trunk flexed, Knee flexed in stance - right, Knee flexed in stance - left, Wide base of support ?  ?  ?  ?General Gait Details: Cues for posture (trunk and knees), sequence, proper use of RW. Min guard for safety. Pt tolerated activity well. Pt slowly transitioning to step thru pattern. ? ? ?Stairs ?  ?  ?  ?  ?  ? ? ?Wheelchair Mobility ?  ? ?Modified Rankin (Stroke Patients Only) ?  ? ? ?  ?Balance Overall balance assessment: Needs assistance ?  ?  ?  ?  ?Standing balance support: Bilateral upper extremity supported, Reliant on assistive device for balance, During functional activity ?Standing balance-Leahy Scale: Fair ?  ?  ?  ?  ?  ?  ?  ?  ?  ?  ?  ?  ?  ? ?  ?Cognition Arousal/Alertness: Awake/alert ?Behavior During Therapy: [facility] for tasks assessed/performed ?Overall Cognitive Status: Within Functional Limits for tasks assessed ?  ?  ?  ?  ?  ?  ?  ?  ?  ?  ?  ?  ?  ?  ?  ?  ?  ?  ?  ? ?  ?Exercises Total Joint Exercises ?Ankle Circles/Pumps: AROM, Both, 10 reps ?Quad Sets: AROM, Both, 10 reps ?Short Arc Quad: AROM, Left, 10 reps ?Heel Slides: AROM, Left, 10 reps ?Hip ABduction/ADduction: AROM, Left, 10 reps ? ?  ?General Comments   ?  ?  ? ?Pertinent Vitals/Pain Pain Assessment ?Pain Assessment: 0-10 ?Pain Score: 4  ?Pain Location: L hip ?Pain Descriptors / Indicators: Discomfort, Sore, Aching ?Pain Intervention(s): Monitored during session, Repositioned, Ice applied  ? ? ?Home Living Family/patient expects to be discharged to:: Private residence ?Living  Arrangements: Alone ?Available Help at Discharge: Family ?Type of Home: House ?Home Access: Stairs to enter ?Entrance Stairs-Rails: Right ?Entrance Stairs-Number of Steps: 2 ?  ?Home Layout: One level ?Home Equipment: Conservation officer, nature (2 wheels);Rollator (4 wheels);Cane - single point ?   ?  ?Prior Function    ?  ?  ?   ? ?PT Goals (current goals can now be found in the care plan section) Progress towards PT goals: Progressing toward goals ? ?  ?Frequency ? ? ?  7X/week ? ? ? ?  ?PT Plan Current plan remains appropriate  ? ? ?Co-evaluation   ?  ?  ?  ?  ? ?  ?AM-PAC PT "6 Clicks" Mobility   ?Outcome Measure ? Help needed turning from your back to your side while in a flat bed without using bedrails?: A Little ?Help needed moving from lying on your back to sitting on the side of a flat bed without using bedrails?: A Little ?Help needed moving to and from a bed to a chair (including a wheelchair)?: A Little ?Help needed standing up from a chair using your arms (e.g., wheelchair or bedside chair)?: A Little ?Help needed to walk in hospital room?: A Little ?Help needed climbing 3-5 steps with a railing? : A Little ?6 Click Score: 18 ? ?  ?End of Session Equipment Utilized During Treatment: Gait belt ?Activity Tolerance: Patient tolerated treatment well ?Patient left: in chair;with call bell/phone within reach ?  ?PT Visit Diagnosis: Other abnormalities of gait and mobility (R26.89);Pain ?Pain - Right/Left: Left ?Pain - part of body: Hip ?  ? ? ?Time: 9509-3267 ?PT Time Calculation (min) (ACUTE ONLY): 28 min ? ?Charges:  $Gait Training: 8-22 mins ?$Therapeutic Exercise: 8-22 mins          ?          ? ? ? ? ? ?Doreatha Massed, PT ?Acute Rehabilitation  ?Office: [phone] ?Pager: [phone] ? ?  ? ?

## 2021-06-25 NOTE — Discharge Summary (Signed)
Physician Discharge Summary  ?Patient ID: ?Jacqueline Nash ?MRN: [id number] ?DOB/AGE: [birth year] 81 y.o. ? ?Admit date: 06/23/2021 ?Discharge date: 06/25/2021 ? ?Admission Diagnoses:  ?Osteoarthritis of left hip ? ?Discharge Diagnoses:  ?Principal Problem: ?  Osteoarthritis of left hip ? ? ?Past Medical History:  ?Diagnosis Date  ? [facility])   ? Depression   ? Hyperlipidemia   ? Hypothyroid   ? Insomnia   ? Migraine   ? Osteoporosis   ? Peripheral neuropathy   ? Scoliosis   ? Thyroid cancer ([place]) 1970  ? White coat hypertension   ? ? ?Surgeries: Procedure(s): ?TOTAL HIP ARTHROPLASTY ?KNEE INJECTION on 06/23/2021 ?  ?Consultants (if any):  ? ?Discharged Condition: Improved ? ?Hospital Course: Jacqueline Nash is an 81 y.o. female who was admitted 06/23/2021 with a diagnosis of Osteoarthritis of left hip and went to the operating room on 06/23/2021 and underwent the above named procedures.   ? ?She was given perioperative antibiotics:  ?Anti-infectives (From admission, onward)  ? ? Start     Dose/Rate Route Frequency Ordered Stop  ? 06/23/21 2000  ceFAZolin (ANCEF) IVPB 2g/100 mL premix       ? 2 g ?200 mL/hr over 30 Minutes Intravenous Every 6 hours 06/23/21 1740 06/24/21 0214  ? 06/23/21 1145  ceFAZolin (ANCEF) IVPB 2g/100 mL premix       ? 2 g ?200 mL/hr over 30 Minutes Intravenous On call to O.R. 06/23/21 1132 06/23/21 1402  ? ?  ?. ? ?She was given sequential compression devices, early ambulation, and aspirin for DVT prophylaxis. ? ?She benefited maximally from the hospital stay and there were no complications.   ? ?Recent vital signs:  ?Vitals:  ? 06/25/21 0500 06/25/21 1351  ?BP: 131/72 122/63  ?Pulse: 64 70  ?Resp: 12 14  ?Temp: 97.7 ?F (36.5 ?C) 98.1 ?F (36.7 ?C)  ?SpO2: 95% 95%  ? ? ?Recent laboratory studies:  ?Lab Results  ?Component Value Date  ? HGB 10.4 (L) 06/25/2021  ? HGB 11.1 (L) 06/24/2021  ? HGB 14.2 06/10/2021  ? ?Lab Results  ?Component Value Date  ? WBC 10.5 06/25/2021  ? PLT 197  06/25/2021  ? ?No results found for: INR ?Lab Results  ?Component Value Date  ? NA 140 06/24/2021  ? K 4.1 06/24/2021  ? CL 106 06/24/2021  ? CO2 25 06/24/2021  ? BUN 21 06/24/2021  ? CREATININE 0.91 06/24/2021  ? GLUCOSE 194 (H) 06/24/2021  ? ? ?Discharge Medications:   ?Allergies as of 06/25/2021   ? ?   Reactions  ? Sulfa Antibiotics Hives  ? Demerol [meperidine] Hives  ? Garlic Diarrhea, Swelling  ? Gluten Meal Diarrhea, Swelling  ? Bloating   ? Onion Diarrhea, Swelling  ? ?  ? ?  ?Medication List  ?  ? ?STOP taking these medications   ? ?traMADol 50 MG tablet ?Commonly known as: ULTRAM ?  ? ?  ? ?TAKE these medications   ? ?acetaminophen 500 MG tablet ?Commonly known as: TYLENOL ?Take 2 tablets (1,000 mg total) by mouth every 8 (eight) hours as needed. ?  ?aspirin EC 81 MG tablet ?Take 1 tablet (81 mg total) by mouth 2 (two) times daily for 28 days. Swallow whole. ?Start taking on: June 26, 2021 ?  ?cholecalciferol 25 MCG (1000 UNIT) tablet ?Commonly known as: VITAMIN D3 ?Take 1,000 Units by mouth daily. ?  ?dicyclomine 10 MG capsule ?Commonly known as: BENTYL ?Take 10 mg by mouth with breakfast, with lunch,  and with evening meal. ?  ?EPINEPHrine 0.3 mg/0.3 mL Soaj injection ?Commonly known as: EPI-PEN ?Inject 0.3 mg into the muscle as needed for anaphylaxis. As needed for life-threatening allergic reactions ?  ?levothyroxine 75 MCG tablet ?Commonly known as: SYNTHROID ?Take 75 mcg by mouth daily before breakfast. ?  ?meloxicam 15 MG tablet ?Commonly known as: MOBIC ?Take 15 mg by mouth daily. ?  ?mirtazapine 7.5 MG tablet ?Commonly known as: REMERON ?Take 7.5 mg by mouth at bedtime. ?  ?montelukast 10 MG tablet ?Commonly known as: SINGULAIR ?Take 10 mg by mouth at bedtime. ?  ?omeprazole 10 MG capsule ?Commonly known as: PRILOSEC ?Take 10 mg by mouth daily. ?  ?ondansetron 4 MG tablet ?Commonly known as: Zofran ?Take 1 tablet (4 mg total) by mouth every 8 (eight) hours as needed for up to 14 days for nausea or  vomiting. ?  ?oxyCODONE 5 MG immediate release tablet ?Commonly known as: Roxicodone ?Take 1 tablet (5 mg total) by mouth every 4 (four) hours as needed for up to 7 days for severe pain or moderate pain. ?  ?polyethylene glycol 17 g packet ?Commonly known as: MIRALAX / GLYCOLAX ?Take 17 g by mouth daily as needed for moderate constipation. ?  ?pregabalin 150 MG capsule ?Commonly known as: LYRICA ?Take 150 mg by mouth 2 (two) times daily. ?  ?rosuvastatin 20 MG tablet ?Commonly known as: CRESTOR ?Take 20 mg by mouth at bedtime. ?  ?sertraline 100 MG tablet ?Commonly known as: ZOLOFT ?Take 100 mg by mouth daily. ?  ?SUMAtriptan 50 MG tablet ?Commonly known as: IMITREX ?Take 50 mg by mouth every 2 (two) hours as needed for migraine or headache. May repeat in 2 hours if headache persists or recurs. ?  ?TRIAMCINOLONE ACETONIDE EX ?Apply 1 application topically daily as needed (eczema). ?  ?vitamin C 1000 MG tablet ?Take 1,000 mg by mouth daily. ?  ? ?  ? ? ?Diagnostic Studies: DG HIP PORT UNILAT WITH PELVIS 1V LEFT ? ?Result Date: 06/23/2021 ?CLINICAL DATA:  Intraoperative portable view 4 left hip arthroplasty EXAM: DG HIP (WITH OR WITHOUT PELVIS) 1V PORT LEFT COMPARISON:  None. FINDINGS: Status post left hip arthroplasty with intact hardware. Subcutaneous emphysema as expected. IMPRESSION: Single AP view of the left hip for left hip arthroplasty with intact hardware. No acute complications. Electronically Signed   By: Keane Police D.O.   On: 06/23/2021 15:44  ? ?DG HIP UNILAT W OR W/O PELVIS 2-3 VIEWS LEFT ? ?Result Date: 06/23/2021 ?CLINICAL DATA:  Status post left hip replacement EXAM: DG HIP (WITH OR WITHOUT PELVIS) 2-3V LEFT COMPARISON:  None. FINDINGS: Left total hip prosthesis is well seated without periprosthetic fracture or lucency. Subcutaneous emphysema is consistent with immediate postop status. Cross-table lateral view is limited. IMPRESSION: Uncomplicated left total hip prosthesis with immediate postop  changes. Electronically Signed   By: Miachel Roux M.D.   On: 06/23/2021 17:09   ? ?Disposition: Discharge disposition: 01-Home or Self Care ? ? ? ? ? ? ?Discharge Instructions   ? ? Call MD / Call 911   Complete by: As directed ?  ? If you experience chest pain or shortness of breath, CALL 911 and be transported to the hospital emergency room.  If you develope a fever above 101 F, pus (white drainage) or increased drainage or redness at the wound, or calf pain, call your surgeon's office.  ? Constipation Prevention   Complete by: As directed ?  ? Drink plenty of fluids.  Prune juice may be helpful.  You may use a stool softener, such as Colace (over the counter) 100 mg twice a day.  Use MiraLax (over the counter) for constipation as needed.  ? Diet - low sodium heart healthy   Complete by: As directed ?  ? Follow the hip precautions as taught in Physical Therapy   Complete by: As directed ?  ? Increase activity slowly as tolerated   Complete by: As directed ?  ? Post-operative opioid taper instructions:   Complete by: As directed ?  ? POST-OPERATIVE OPIOID TAPER INSTRUCTIONS: ?It is important to wean off of your opioid medication as soon as possible. If you do not need pain medication after your surgery it is ok to stop day one. ?Opioids include: ?Codeine, Hydrocodone(Norco, Vicodin), Oxycodone(Percocet, oxycontin) and hydromorphone amongst others.  ?Long term and even short term use of opiods can cause: ?Increased pain response ?Dependence ?Constipation ?Depression ?Respiratory depression ?And more.  ?Withdrawal symptoms can include ?Flu like symptoms ?Nausea, vomiting ?And more ?Techniques to manage these symptoms ?Hydrate well ?Eat regular healthy meals ?Stay active ?Use relaxation techniques(deep breathing, meditating, yoga) ?Do Not substitute Alcohol to help with tapering ?If you have been on opioids for less than two weeks and do not have pain than it is ok to stop all together.  ?Plan to wean off of  opioids ?This plan should start within one week post op of your joint replacement. ?Maintain the same interval or time between taking each dose and first decrease the dose.  ?Cut the total daily intake of opioids by one tab

## 2021-06-25 NOTE — Progress Notes (Signed)
? ? ? ?  Subjective: ? ?Patient doing very well. Worked well with PT yesterday ambulating 16f. Pain has been well controlled. Denies distal n/t.  ? ?Objective:  ? ?VITALS:   ?Vitals:  ? 06/24/21 1000 06/24/21 1341 06/24/21 2238 06/25/21 0500  ?BP: (!) 106/59 101/62 121/64 131/72  ?Pulse: 74 82 78 64  ?Resp: '16 18 18 12  '$ ?Temp: 98 ?F (36.7 ?C) 98.1 ?F (36.7 ?C) 98.1 ?F (36.7 ?C) 97.7 ?F (36.5 ?C)  ?TempSrc: Oral  Oral   ?SpO2: 94% 97% 94% 95%  ?Weight:      ?Height:      ? ? ?Sensation intact distally ?Intact pulses distally ?Dorsiflexion/Plantar flexion intact ?Incision: dressing C/D/I ?Compartment soft ? ? ?Lab Results  ?Component Value Date  ? WBC 10.5 06/25/2021  ? HGB 10.4 (L) 06/25/2021  ? HCT 33.6 (L) 06/25/2021  ? MCV 99.1 06/25/2021  ? PLT 197 06/25/2021  ? ?BMET ?   ?Component Value Date/Time  ? NA 140 06/24/2021 0255  ? K 4.1 06/24/2021 0255  ? CL 106 06/24/2021 0255  ? CO2 25 06/24/2021 0255  ? GLUCOSE 194 (H) 06/24/2021 0255  ? BUN 21 06/24/2021 0255  ? CREATININE 0.91 06/24/2021 0255  ? CALCIUM 9.1 06/24/2021 0255  ? GFRNONAA >60 06/24/2021 0255  ? ? ? ? ?Xray: xrays with components in good position no adverse features, no fx or dislocation ? ?Assessment/Plan: ?2 Days Post-Op  ? ?Principal Problem: ?  Osteoarthritis of left hip ? ?L THA 3/14 ? ?  ?Post op recs: ?WB: WBAT LLE, posterior hip precautions x6 weeks ?Abx: ancef x23 hours post op ?Imaging: PACU pelvis Xray ?Dressing: Aquacell, keep intact until follow up ?DVT prophylaxis: Aspirin 81BID starting POD1 ?Follow up: 2 weeks after surgery [facility]  ?Address[place] [address] [place] [address] ?Office Phone: [phone]  ? ? ? ?Hutch Rhett A Harshil Cavallaro ?06/25/2021, 7:36 AM ? ? ?DCharlies Constable MD ? ?Contact information:   ?Weekdays 7am-5pm epic message Dr. MZachery Dakins or call office for patient follow up: [phone] ?After hours and holidays please check [email or web address] for  group call information for [facility] ? ?  ?

## 2021-06-25 NOTE — Progress Notes (Signed)
Physical Therapy Treatment ?Patient Details ?Name: Jacqueline Nash ?MRN: [id number] ?DOB: 1940/11/21 ?Today's Date: 06/25/2021 ? ? ?History of Present Illness 81 yo female s/p L THA-posterior, R knee cortisone injection 06/23/21. Hx of scoliosis, peripheral neuropathy, ostopenia ? ?  ?PT Comments  ? ? 2nd session to continue gait and stair training. Verbally reviewed car transfer technique. All education completed.    ?Recommendations for follow up therapy are one component of a multi-disciplinary discharge planning process, led by the attending physician.  Recommendations may be updated based on patient status, additional functional criteria and insurance authorization. ? ?Follow Up Recommendations ? Follow physician's recommendations for discharge plan and follow up therapies ?  ?  ?Assistance Recommended at Discharge Frequent or constant Supervision/Assistance  ?Patient can return home with the following A little help with walking and/or transfers;A little help with bathing/dressing/bathroom;Assistance with cooking/housework;Assist for transportation;Help with stairs or ramp for entrance ?  ?Equipment Recommendations ? None recommended by PT  ?  ?Recommendations for Other Services   ? ? ?  ?Precautions / Restrictions Precautions ?Precautions: Posterior Hip ?Precaution Booklet Issued: Yes (comment) ?Restrictions ?Weight Bearing Restrictions: No ?Other Position/Activity Restrictions: WBAT  ?  ? ?Mobility ? Bed Mobility ?  ?  ?  ?  ?  ?  ?  ?General bed mobility comments: oob in recliner ?  ? ?Transfers ?Overall transfer level: Needs assistance ?Equipment used: Rolling walker (2 wheels) ?Transfers: Sit to/from Stand ?Sit to Stand: Min guard ?  ?  ?  ?  ?  ?General transfer comment: Min guard for safety. Cues for safety, technique, hand/LE placement, adherence to precautions ?  ? ?Ambulation/Gait ?Ambulation/Gait assistance: Min guard ?Gait Distance (Feet): 75 Feet ?Assistive device: Rolling walker (2 wheels) ?Gait  Pattern/deviations: Step-to pattern, Trunk flexed, Knee flexed in stance - right, Knee flexed in stance - left, Wide base of support ?  ?  ?  ?General Gait Details: Cues for posture (trunk and knees), sequence. Min guard for safety. Pt tolerated activity well. Pt slowly transitioning to step thru pattern. ? ? ?Stairs ?Stairs: Yes ?Stairs assistance: Min assist ?Stair Management: Forwards, One rail Right, Step to pattern, With cane ?Number of Stairs: 2 ?General stair comments: up and overt portable stairs x 1. cues for safety, technique, sequence. Pt used 1 cane, 1 rail. ? ? ?Wheelchair Mobility ?  ? ?Modified Rankin (Stroke Patients Only) ?  ? ? ?  ?Balance Overall balance assessment: Needs assistance ?  ?  ?  ?  ?Standing balance support: Bilateral upper extremity supported, Reliant on assistive device for balance, During functional activity ?Standing balance-Leahy Scale: Fair ?  ?  ?  ?  ?  ?  ?  ?  ?  ?  ?  ?  ?  ? ?  ?Cognition Arousal/Alertness: Awake/alert ?Behavior During Therapy: [facility] for tasks assessed/performed ?Overall Cognitive Status: Within Functional Limits for tasks assessed ?  ?  ?  ?  ?  ?  ?  ?  ?  ?  ?  ?  ?  ?  ?  ?  ?  ?  ?  ? ?  ?Exercises Total Joint Exercises ?Ankle Circles/Pumps: AROM, Both, 10 reps ?Quad Sets: AROM, Both, 10 reps ?Short Arc Quad: AROM, Left, 10 reps ?Heel Slides: AROM, Left, 10 reps ?Hip ABduction/ADduction: AROM, Left, 10 reps ? ?  ?General Comments   ?  ?  ? ?Pertinent Vitals/Pain Pain Assessment ?Pain Assessment: 0-10 ?Pain Score: 6  ?Pain Location: L hip/thigh ?Pain  Descriptors / Indicators: Sore, Discomfort, Aching ?Pain Intervention(s): Limited activity within patient's tolerance, Monitored during session, Ice applied, Repositioned  ? ? ?Home Living Family/patient expects to be discharged to:: Private residence ?Living Arrangements: Alone ?Available Help at Discharge: Family ?Type of Home: House ?Home Access: Stairs to enter ?Entrance Stairs-Rails: Right ?Entrance  Stairs-Number of Steps: 2 ?  ?Home Layout: One level ?Home Equipment: Conservation officer, nature (2 wheels);Rollator (4 wheels);Cane - single point ?   ?  ?Prior Function    ?  ?  ?   ? ?PT Goals (current goals can now be found in the care plan section) Progress towards PT goals: Progressing toward goals ? ?  ?Frequency ? ? ? 7X/week ? ? ? ?  ?PT Plan Current plan remains appropriate  ? ? ?Co-evaluation   ?  ?  ?  ?  ? ?  ?AM-PAC PT "6 Clicks" Mobility   ?Outcome Measure ? Help needed turning from your back to your side while in a flat bed without using bedrails?: A Little ?Help needed moving from lying on your back to sitting on the side of a flat bed without using bedrails?: A Little ?Help needed moving to and from a bed to a chair (including a wheelchair)?: A Little ?Help needed standing up from a chair using your arms (e.g., wheelchair or bedside chair)?: A Little ?Help needed to walk in hospital room?: A Little ?Help needed climbing 3-5 steps with a railing? : A Little ?6 Click Score: 18 ? ?  ?End of Session Equipment Utilized During Treatment: Gait belt ?Activity Tolerance: Patient tolerated treatment well ?Patient left: in chair;with call bell/phone within reach ?  ?PT Visit Diagnosis: Other abnormalities of gait and mobility (R26.89);Pain ?Pain - Right/Left: Left ?Pain - part of body: Hip ?  ? ? ?Time: 3875-6433 ?PT Time Calculation (min) (ACUTE ONLY): 18 min ? ?Charges:  $Gait Training: 8-22 mins ?$Therapeutic Exercise: 8-22 mins          ?          ? ? ? ? ? ?Doreatha Massed, PT ?Acute Rehabilitation  ?Office: [phone] ?Pager: [phone] ? ?  ? ?

## 2021-06-25 NOTE — Evaluation (Signed)
Occupational Therapy Evaluation ?Patient Details ?Name: Jacqueline Nash ?MRN: [id number] ?DOB: 08-22-1940 ?Today's Date: 06/25/2021 ? ? ?History of Present Illness 81 yo female s/p L THA-posterior, R knee cortisone injection 06/23/21. Hx of scoliosis, peripheral neuropathy, ostopenia  ? ?Clinical Impression ?  ?[name] is an 81 year old woman s/p hip surgery with decreased ROM and strength in LLE, pain, decreased activity tolerance and impaired balance resulting in a sudden decline in functional abilities. Patient limited by posterior hip precautions and needing assistance for LB ADLs. Patient provided with education and instruction on posterior hip precautions and how to perform dressing with ADL kit. Patient demonstrated use of LHR and sock aide to don and doff clothing. Patient ordered kit while therapist in the room. Patient will benefit from  continued skilled OT services while in hospital to improve deficits and learn compensatory strategies as needed in order to return to PLOF.  Patient going home with her daughter. Would recommend BSC at discharge. Will follow acutely.  ?   ? ?Recommendations for follow up therapy are one component of a multi-disciplinary discharge planning process, led by the attending physician.  Recommendations may be updated based on patient status, additional functional criteria and insurance authorization.  ? ?Follow Up Recommendations ? Follow physician's recommendations for discharge plan and follow up therapies  ?  ?Assistance Recommended at Discharge Intermittent Supervision/Assistance  ?Patient can return home with the following A little help with walking and/or transfers;A little help with bathing/dressing/bathroom;Assistance with cooking/housework;Help with stairs or ramp for entrance ? ?  ?Functional Status Assessment ? Patient has had a recent decline in their functional status and demonstrates the ability to make significant improvements in function in a  reasonable and predictable amount of time.  ?Equipment Recommendations ? BSC/3in1  ?  ?Recommendations for Other Services   ? ? ?  ?Precautions / Restrictions Precautions ?Precautions: Posterior Hip ?Precaution Booklet Issued: Yes (comment) ?Restrictions ?Weight Bearing Restrictions: No ?Other Position/Activity Restrictions: WBAT  ? ?  ? ?Mobility Bed Mobility ?  ?  ?  ?  ?  ?  ?  ?  ?  ? ?Transfers ?  ?  ?  ?  ?  ?  ?  ?  ?  ?  ?  ? ?  ?Balance Overall balance assessment: Needs assistance ?Sitting-balance support: No upper extremity supported, Feet supported ?Sitting balance-Leahy Scale: Fair ?  ?  ?Standing balance support: Bilateral upper extremity supported, Reliant on assistive device for balance, During functional activity ?Standing balance-Leahy Scale: Poor ?  ?  ?  ?  ?  ?  ?  ?  ?  ?  ?  ?  ?   ? ?ADL either performed or assessed with clinical judgement  ? ?ADL Overall ADL's : Needs assistance/impaired ?Eating/Feeding: Independent;Sitting ?  ?Grooming: Set up;Sitting ?  ?Upper Body Bathing: Set up;Sitting ?  ?Lower Body Bathing: Moderate assistance;Sit to/from stand ?  ?Upper Body Dressing : Set up;Sitting ?  ?Lower Body Dressing: Sit to/from stand;Maximal assistance ?  ?Toilet Transfer: Min guard;Comfort height toilet;Grab bars;Rolling walker (2 wheels) ?  ?Toileting- Water quality scientist and Hygiene: Min guard;Sit to/from stand ?  ?Tub/ Shower Transfer: Min guard;Shower seat ?  ?Functional mobility during ADLs: Min guard;Rolling walker (2 wheels) ?   ? ? ? ?Vision Baseline Vision/History: 1 Wears glasses ?Patient Visual Report: No change from baseline ?   ?   ?Perception   ?  ?Praxis   ?  ? ?Pertinent Vitals/Pain Pain Assessment ?Pain  Assessment: Faces ?Faces Pain Scale: Hurts a little bit ?Pain Location: L hip ?Pain Descriptors / Indicators: Discomfort, Sore, Aching ?Pain Intervention(s): Premedicated before session  ? ? ? ?Hand Dominance Left ?  ?Extremity/Trunk Assessment Upper Extremity  Assessment ?Upper Extremity Assessment: RUE deficits/detail;LUE deficits/detail ?RUE Deficits / Details: WFL ROM, 5/5 strength ?RUE Sensation: WNL ?RUE Coordination: WNL ?LUE Deficits / Details: WFl ROM, 5/5 strength ?LUE Sensation: WNL ?LUE Coordination: WNL ?  ?Lower Extremity Assessment ?Lower Extremity Assessment: Defer to PT evaluation ?  ?Cervical / Trunk Assessment ?Cervical / Trunk Assessment: Other exceptions ?Cervical / Trunk Exceptions: scoliosis ?  ?Communication Communication ?Communication: No difficulties ?  ?Cognition Arousal/Alertness: Awake/alert ?Behavior During Therapy: [facility] for tasks assessed/performed ?Overall Cognitive Status: Within Functional Limits for tasks assessed ?  ?  ?  ?  ?  ?  ?  ?  ?  ?  ?  ?  ?  ?  ?  ?  ?  ?  ?  ?General Comments    ? ?  ?Exercises   ?  ?Shoulder Instructions    ? ? ?Home Living Family/patient expects to be discharged to:: Private residence ?Living Arrangements: Alone ?Available Help at Discharge: Family ?Type of Home: House ?Home Access: Stairs to enter ?Entrance Stairs-Number of Steps: 2 ?Entrance Stairs-Rails: Right ?Home Layout: One level ?  ?  ?  ?  ?  ?  ?  ?Home Equipment: Conservation officer, nature (2 wheels);Rollator (4 wheels);Cane - single point ?  ?  ?  ? ?  ?Prior Functioning/Environment Prior Level of Function : Independent/Modified Independent ?  ?  ?  ?  ?  ?  ?Mobility Comments: using walker for amblation ?  ?  ? ?  ?  ?OT Problem List: Decreased strength;Decreased range of motion;Decreased activity tolerance;Decreased knowledge of use of DME or AE;Decreased knowledge of precautions;Pain ?  ?   ?OT Treatment/Interventions: Self-care/ADL training;Therapeutic exercise;DME and/or AE instruction;Therapeutic activities;Balance training;Patient/family education  ?  ?OT Goals(Current goals can be found in the care plan section) Acute Rehab OT Goals ?Patient Stated Goal: to return to independence ?OT Goal Formulation: With patient ?Time For Goal Achievement:  07/09/21 ?Potential to Achieve Goals: Good  ?OT Frequency: Min 2X/week ?  ? ?Co-evaluation   ?  ?  ?  ?  ? ?  ?AM-PAC OT "6 Clicks" Daily Activity     ?Outcome Measure Help from another person eating meals?: None ?Help from another person taking care of personal grooming?: A Little ?Help from another person toileting, which includes using toliet, bedpan, or urinal?: A Little ?Help from another person bathing (including washing, rinsing, drying)?: A Lot ?Help from another person to put on and taking off regular upper body clothing?: A Little ?Help from another person to put on and taking off regular lower body clothing?: A Lot ?6 Click Score: 17 ?  ?End of Session Equipment Utilized During Treatment: Rolling walker (2 wheels);Gait belt ?Nurse Communication: Mobility status ? ?Activity Tolerance: Patient tolerated treatment well ?Patient left: in chair;with call bell/phone within reach ? ?OT Visit Diagnosis: Other abnormalities of gait and mobility (R26.89);Pain  ?              ?Time: 1829-9371 ?OT Time Calculation (min): 23 min ?Charges:  OT General Charges ?$OT Visit: 1 Visit ?OT Evaluation ?$OT Eval Low Complexity: 1 Low ?OT Treatments ?$Self Care/Home Management : 8-22 mins ? ?Canna Nickelson, OTR/L ?Acute Care Rehab Services  ?Office [phone] ?Pager: [phone]  ? ?Lucciano Vitali L Eddison Searls ?06/25/2021, 12:17 PM ?

## 2021-06-25 NOTE — Plan of Care (Signed)
Plan of care reviewed and discussed with the patient. 

## 2021-06-25 NOTE — Plan of Care (Signed)
?  Problem: Health Behavior/Discharge Planning: ?Goal: Ability to manage health-related needs will improve ?Outcome: Adequate for Discharge ?  ?Problem: Clinical Measurements: ?Goal: Ability to maintain clinical measurements within normal limits will improve ?Outcome: Adequate for Discharge ?Goal: Will remain free from infection ?Outcome: Adequate for Discharge ?Goal: Diagnostic test results will improve ?Outcome: Adequate for Discharge ?  ?Problem: Activity: ?Goal: Risk for activity intolerance will decrease ?Outcome: Adequate for Discharge ?  ?Problem: Coping: ?Goal: Level of anxiety will decrease ?Outcome: Adequate for Discharge ?  ?Problem: Elimination: ?Goal: Will not experience complications related to bowel motility ?Outcome: Adequate for Discharge ?  ?Problem: Pain Managment: ?Goal: General experience of comfort will improve ?Outcome: Adequate for Discharge ?  ?Problem: Safety: ?Goal: Ability to remain free from injury will improve ?Outcome: Adequate for Discharge ?  ?Problem: Skin Integrity: ?Goal: Risk for impaired skin integrity will decrease ?Outcome: Adequate for Discharge ?  ?Problem: Education: ?Goal: Knowledge of the prescribed therapeutic regimen will improve ?Outcome: Adequate for Discharge ?Goal: Understanding of discharge needs will improve ?Outcome: Adequate for Discharge ?  ?Problem: Activity: ?Goal: Ability to avoid complications of mobility impairment will improve ?Outcome: Adequate for Discharge ?Goal: Ability to tolerate increased activity will improve ?Outcome: Adequate for Discharge ?  ?Problem: Clinical Measurements: ?Goal: Postoperative complications will be avoided or minimized ?Outcome: Adequate for Discharge ?  ?Problem: Pain Management: ?Goal: Pain level will decrease with appropriate interventions ?Outcome: Adequate for Discharge ?  ?Problem: Skin Integrity: ?Goal: Will show signs of wound healing ?Outcome: Adequate for Discharge ?  ?Problem: Acute Rehab PT Goals(only PT should  resolve) ?Goal: Pt Will Go Supine/Side To Sit ?Outcome: Adequate for Discharge ?Goal: Pt Will Go Sit To Supine/Side ?Outcome: Adequate for Discharge ?Goal: Patient Will Transfer Sit To/From Stand ?Outcome: Adequate for Discharge ?Goal: Pt Will Ambulate ?Outcome: Adequate for Discharge ?  ?Problem: Acute Rehab OT Goals (only OT should resolve) ?Goal: Pt. Will Perform Lower Body Dressing ?Outcome: Adequate for Discharge ?Goal: Pt. Will Transfer To Toilet ?Outcome: Adequate for Discharge ?Goal: Pt. Will Perform Toileting-Clothing Manipulation ?Outcome: Adequate for Discharge ?Goal: OT Additional ADL Goal #1 ?Outcome: Adequate for Discharge ?  ?

## 2022-02-11 NOTE — H&P (Signed)
KNEE ARTHROPLASTY ADMISSION H&P  Patient ID: Jacqueline Nash MRN: [id number] DOB/AGE: 1940-10-11 81 y.o.  Chief Complaint: right knee pain.  Planned Procedure Date: 03/02/22 Medical Clearance by   Dr. Marlou Porch   HPI: Jacqueline Nash is a 81 y.o. 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 The patient has a history of pain and functional disability in the right knee due to arthritis and has failed non-surgical conservative treatments for greater than 12 weeks to include NSAID's and/or analgesics, corticosteriod injections, viscosupplementation injections, use of assistive devices, and activity modification.  Onset of symptoms was gradual, starting 3 years ago with gradually worsening course since that time. The patient noted no past surgery on the right knee.  Patient currently rates pain at 6 out of 10 with activity. Patient has worsening of pain with activity and weight bearing and pain that interferes with activities of daily living.  Patient has evidence of joint space narrowing by imaging studies.  There is no active infection.  Past Medical History:  Diagnosis Date   Cancer ([place])    Depression    Hyperlipidemia    Hypothyroid    Insomnia    Migraine    Osteoporosis    Peripheral neuropathy    Scoliosis    Thyroid cancer ([place]) 1970   White coat hypertension    Past Surgical History:  Procedure Laterality Date   CARPAL TUNNEL RELEASE Right    CATARACT EXTRACTION     CHOLECYSTECTOMY     HARDWARE REMOVAL     right toe   INJECTION KNEE Right 06/23/2021   Procedure: KNEE INJECTION;  Surgeon: Willaim Sheng, MD;  Location: [facility];  Service: Orthopedics;  Laterality: Right;   right toe fusion     THYROIDECTOMY     TONSILLECTOMY     TOTAL HIP ARTHROPLASTY Left 06/23/2021   Procedure: TOTAL HIP ARTHROPLASTY;  Surgeon: Willaim Sheng, MD;  Location: [facility];  Service: Orthopedics;  Laterality: Left;   Allergies  Allergen Reactions   Sulfa  Antibiotics Hives   Demerol [Meperidine] Hives   Garlic Diarrhea and Swelling   Gluten Meal Diarrhea and Swelling    Bloating    Onion Diarrhea and Swelling   Prior to Admission medications   Medication Sig Start Date End Date Taking? Authorizing Provider  Ascorbic Acid (VITAMIN C) 1000 MG tablet Take 1,000 mg by mouth daily.    [provider]  cholecalciferol (VITAMIN D3) 25 MCG (1000 UNIT) tablet Take 1,000 Units by mouth daily.    [provider]  dicyclomine (BENTYL) 10 MG capsule Take 10 mg by mouth with breakfast, with lunch, and with evening meal.    [provider]  EPINEPHrine 0.3 mg/0.3 mL IJ SOAJ injection Inject 0.3 mg into the muscle as needed for anaphylaxis. As needed for life-threatening allergic reactions 02/13/20   Garnet Sierras, DO  levothyroxine (SYNTHROID, LEVOTHROID) 75 MCG tablet Take 75 mcg by mouth daily before breakfast.    [provider]  meloxicam (MOBIC) 15 MG tablet Take 15 mg by mouth daily.    [provider]  mirtazapine (REMERON) 7.5 MG tablet Take 7.5 mg by mouth at bedtime.    [provider]  montelukast (SINGULAIR) 10 MG tablet Take 10 mg by mouth at bedtime.    [provider]  omeprazole (PRILOSEC) 10 MG capsule Take 10 mg by mouth daily.    [provider]  polyethylene glycol (MIRALAX / GLYCOLAX) 17 g packet Take  17 g by mouth daily as needed for moderate constipation.    [provider]  pregabalin (LYRICA) 150 MG capsule Take 150 mg by mouth 2 (two) times daily.    [provider]  rosuvastatin (CRESTOR) 20 MG tablet Take 20 mg by mouth at bedtime.    [provider]  sertraline (ZOLOFT) 100 MG tablet Take 100 mg by mouth daily.    [provider]  SUMAtriptan (IMITREX) 50 MG tablet Take 50 mg by mouth every 2 (two) hours as needed for migraine or headache. May repeat in 2 hours if headache persists or recurs.    [provider]   TRIAMCINOLONE ACETONIDE EX Apply 1 application topically daily as needed (eczema).    [provider]   Social History   Socioeconomic History   Marital status: Divorced    Spouse name: Not on file   Number of children: Not on file   Years of education: Not on file   Highest education level: Not on file  Occupational History   Not on file  Tobacco Use   Smoking status: Former   Smokeless tobacco: Former  Scientific laboratory technician Use: Never used  Substance and Sexual Activity   Alcohol use: Yes    Alcohol/week: 3.0 standard drinks of alcohol    Types: 3 Glasses of wine per week   Drug use: No   Sexual activity: Not on file  Other Topics Concern   Not on file  Social History Narrative   Not on file   Social Determinants of Health   Financial Resource Strain: Not on file  Food Insecurity: Not on file  Transportation Needs: Not on file  Physical Activity: Not on file  Stress: Not on file  Social Connections: Not on file   Family History  Problem Relation Age of Onset   Asthma Daughter    Asthma Brother    Allergic rhinitis Neg Hx    Angioedema Neg Hx    Atopy Neg Hx    Eczema Neg Hx    Immunodeficiency Neg Hx    Urticaria Neg Hx     ROS: Currently denies lightheadedness, dizziness, Fever, chills, CP, SOB.   No personal history of DVT, PE, MI, or CVA. No loose teeth or dentures All other systems have been reviewed and were otherwise currently negative with the exception of those mentioned in the HPI and as above.  Objective: Vitals: Ht: 5'2" Wt: 143.6 lbs Temp: 97.6 BP: 155/88 Pulse: 62 O2 95% on room air.   Physical Exam: General: Alert, NAD.  Antalgic Gait  HEENT: EOMI, Good Neck Extension  Pulm: No increased work of breathing.  Clear B/L A/P w/o crackle or wheeze.  CV: RRR, No m/g/r appreciated  GI: soft, ND Neuro: Neuro without gross focal deficit.  Sensation intact distally Skin: No lesions in the area of chief complaint MSK/Surgical Site: right  knee w/o redness or effusion.  Medial and lateral JLT. ROM 0-120.  5/5 strength in extension and flexion.  +EHL/FHL.  NVI.  Stable varus and valgus stress.    Imaging Review Plain radiographs demonstrate severe degenerative joint disease of the right knee.   The overall alignment is valgus. The bone quality appears to be adequate for age and reported activity level.  Preoperative templating of the joint replacement has been completed, documented, and submitted to the Operating Room personnel in order to optimize intra-operative equipment management.  Assessment: OA RIGHT KNEE    Plan: Plan for Procedure(s):  TOTAL KNEE ARTHROPLASTY  The patient history, physical exam, clinical judgement of the provider and imaging are consistent with end stage degenerative joint disease and total joint arthroplasty is deemed medically necessary. The treatment options including medical management, injection therapy, and arthroplasty were discussed at length. The risks and benefits of Procedure(s): TOTAL KNEE ARTHROPLASTY were presented and reviewed.  The risks of nonoperative treatment, versus surgical intervention including but not limited to continued pain, aseptic loosening, stiffness, dislocation/subluxation, infection, bleeding, nerve injury, blood clots, cardiopulmonary complications, morbidity, mortality, among others were discussed. The patient verbalizes understanding and wishes to proceed with the plan.  Patient is being admitted for inpatient treatment for surgery, pain control, PT, prophylactic antibiotics, VTE prophylaxis, progressive ambulation, ADL's and discharge planning.   Dental prophylaxis discussed and recommended for 2 years postoperatively.  The patient does meet the criteria for TXA which will be used perioperatively.   Eliquis 2.5 mg BID will be used postoperatively for DVT prophylaxis in addition to SCDs, and early ambulation The patient is planning to be discharged home with OPPT  in care of daughter   Ventura Bruns, Hershal Coria 02/11/2022 11:09 AM

## 2022-02-16 NOTE — Progress Notes (Addendum)
Anesthesia Review:  PCP: Zachery Dakins - LOV 02/02/22 Telemedicine visit  Clearance dated 12/02/21 on chart  Office visit of 07/31/21 on chart  Chest x-ray : EKG : 02/17/22  Echo : Stress test: Cardiac Cath :  Activity level: can do a flight of stairs without difficutly  Sleep Study/ CPAP : none  Fasting Blood Sugar :      / Checks Blood Sugar -- times a day:   Blood Thinner/ Instructions /Last Dose: ASA / Instructions/ Last Dose :  PT was 20 minutes late for preop appt.   Pt is a retired Engineer, civil (consulting)   Pt reports at preop appt that she had cardiac work up years ago and it was negative.  Could  not remember what MD.  Could find nothing in epic.  Hx of hypertension also not in meds in years per pt.

## 2022-02-16 NOTE — Patient Instructions (Addendum)
SURGICAL WAITING ROOM VISITATION Patients having surgery or a procedure may have no more than 2 support people in the waiting area - these visitors may rotate.   Children under the age of 11 must have an adult with them who is not the patient. If the patient needs to stay at the hospital during part of their recovery, the visitor guidelines for inpatient rooms apply. Pre-op nurse will coordinate an appropriate time for 1 support person to accompany patient in pre-op.  This support person may not rotate.    Please refer to the [facility] website for the visitor guidelines for Inpatients (after your surgery is over and you are in a regular room).       Your procedure is scheduled on:  03/02/2022    Report to [facility] Main Entrance    Report to admitting at  [place] AM   Call this number if you have problems the morning of surgery [phone]   Do not eat food :After Midnight.   After Midnight you may have the following liquids until _ 0430_____ AM DAY OF SURGERY  Water Non-Citrus Juices (without pulp, NO RED) Carbonated Beverages Black Coffee (NO MILK/CREAM OR CREAMERS, sugar ok)  Clear Tea (NO MILK/CREAM OR CREAMERS, sugar ok) regular and decaf                             Plain Jell-O (NO RED)                                           Fruit ices (not with fruit pulp, NO RED)                                     Popsicles (NO RED)                                                               Sports drinks like Gatorade (NO RED)                 The day of surgery:  Drink ONE (1) Pre-Surgery Clear Ensure or G2 at  0430 AM  ( have completed by )  morning of surgery. Drink in one sitting. Do not sip.  This drink was given to you during your hospital  pre-op appointment visit. Nothing else to drink after completing the  Pre-Surgery Clear Ensure or G2.          If you have questions, please contact your surgeon's office.       Oral Hygiene is also important to reduce  your risk of infection.                                    Remember - BRUSH YOUR TEETH THE MORNING OF SURGERY WITH YOUR REGULAR TOOTHPASTE   Do NOT smoke after Midnight   Take these medicines the morning of surgery with A SIP OF WATER synthroid, omeprazole     DO NOT TAKE ANY  ORAL DIABETIC MEDICATIONS DAY OF YOUR SURGERY  Bring CPAP mask and tubing day of surgery.                              You may not have any metal on your body including hair pins, jewelry, and body piercing             Do not wear make-up, lotions, powders, perfumes/cologne, or deodorant  Do not wear nail polish including gel and S&S, artificial/acrylic nails, or any other type of covering on natural nails including finger and toenails. If you have artificial nails, gel coating, etc. that needs to be removed by a nail salon please have this removed prior to surgery or surgery may need to be canceled/ delayed if the surgeon/ anesthesia feels like they are unable to be safely monitored.   Do not shave  48 hours prior to surgery.               Men may shave face and neck.   Do not bring valuables to the hospital. [place].   Contacts, dentures or bridgework may not be worn into surgery.   Bring small overnight bag day of surgery.   DO NOT [place]. PHARMACY WILL DISPENSE MEDICATIONS LISTED ON YOUR MEDICATION LIST TO YOU DURING YOUR ADMISSION [place]!    Patients discharged on the day of surgery will not be allowed to drive home.  Someone NEEDS to stay with you for the first 24 hours after anesthesia.   Special Instructions: Bring a copy of your healthcare power of attorney and living will documents the day of surgery if you haven't scanned them before.              Please read over the following fact sheets you were given: IF [place] [phone]   If you received a  COVID test during your pre-op visit  it is requested that you wear a mask when out in public, stay away from anyone that may not be feeling well and notify your surgeon if you develop symptoms. If you test positive for Covid or have been in contact with anyone that has tested positive in the last 10 days please notify you surgeon.    [place] - Preparing for Surgery Before surgery, you can play an important role.  Because skin is not sterile, your skin needs to be as free of germs as possible.  You can reduce the number of germs on your skin by washing with CHG (chlorahexidine gluconate) soap before surgery.  CHG is an antiseptic cleaner which kills germs and bonds with the skin to continue killing germs even after washing. Please DO NOT use if you have an allergy to CHG or antibacterial soaps.  If your skin becomes reddened/irritated stop using the CHG and inform your nurse when you arrive at Short Stay. Do not shave (including legs and underarms) for at least 48 hours prior to the first CHG shower.  You may shave your face/neck. Please follow these instructions carefully:  1.  Shower with CHG Soap the night before surgery and the  morning of Surgery.  2.  If you choose to wash your hair, wash your hair first as usual with your  normal  shampoo.  3.  After you shampoo, rinse your hair and body thoroughly to remove the  shampoo.                           4.  Use CHG as you would any other liquid soap.  You can apply chg directly  to the skin and wash                       Gently with a scrungie or clean washcloth.  5.  Apply the CHG Soap to your body ONLY FROM THE NECK DOWN.   Do not use on face/ open                           Wound or open sores. Avoid contact with eyes, ears mouth and genitals (private parts).                       Wash face,  Genitals (private parts) with your normal soap.             6.  Wash thoroughly, paying special attention to the area where your surgery  will be  performed.  7.  Thoroughly rinse your body with warm water from the neck down.  8.  DO NOT shower/wash with your normal soap after using and rinsing off  the CHG Soap.                9.  Pat yourself dry with a clean towel.            10.  Wear clean pajamas.            11.  Place clean sheets on your bed the night of your first shower and do not  sleep with pets. Day of Surgery : Do not apply any lotions/deodorants the morning of surgery.  Please wear clean clothes to the hospital/surgery center.  FAILURE TO FOLLOW THESE INSTRUCTIONS MAY RESULT IN THE CANCELLATION OF YOUR SURGERY PATIENT SIGNATURE_________________________________  NURSE SIGNATURE__________________________________  ________________________________________________________________________

## 2022-02-17 ENCOUNTER — Encounter (HOSPITAL_COMMUNITY)
Admission: RE | Admit: 2022-02-17 | Discharge: 2022-02-17 | Disposition: A | Discharge status: Home / Self Care | Payer: Medicare Other | Source: Ambulatory Visit | Attending: Orthopedic Surgery | Admitting: Orthopedic Surgery

## 2022-02-17 ENCOUNTER — Other Ambulatory Visit: Payer: Self-pay

## 2022-02-17 ENCOUNTER — Encounter (HOSPITAL_COMMUNITY): Payer: Self-pay

## 2022-02-17 VITALS — BP 133/82 | HR 59 | Temp 97.9°F | Resp 16 | Ht 62.0 in | Wt 142.0 lb

## 2022-02-17 DIAGNOSIS — Z01818 Encounter for other preprocedural examination: Secondary | ICD-10-CM | POA: Insufficient documentation

## 2022-02-17 HISTORY — DX: Unspecified osteoarthritis, unspecified site: M19.90

## 2022-02-17 HISTORY — DX: Essential (primary) hypertension: I10

## 2022-02-17 HISTORY — DX: Nausea with vomiting, unspecified: Z98.890

## 2022-02-17 LAB — CBC
HCT: 42.6 % (ref 36.0–46.0)
Hemoglobin: 13.8 g/dL (ref 12.0–15.0)
MCH: 31.2 pg (ref 26.0–34.0)
MCHC: 32.4 g/dL (ref 30.0–36.0)
MCV: 96.4 fL (ref 80.0–100.0)
Platelets: 242 10*3/uL (ref 150–400)
RBC: 4.42 MIL/uL (ref 3.87–5.11)
RDW: 13.6 % (ref 11.5–15.5)
WBC: 5.8 10*3/uL (ref 4.0–10.5)
nRBC: 0 % (ref 0.0–0.2)

## 2022-02-17 LAB — BASIC METABOLIC PANEL
Anion gap: 8 (ref 5–15)
BUN: 28 mg/dL — ABNORMAL HIGH (ref 8–23)
CO2: 25 mmol/L (ref 22–32)
Calcium: 10 mg/dL (ref 8.9–10.3)
Chloride: 108 mmol/L (ref 98–111)
Creatinine, Ser: 0.85 mg/dL (ref 0.44–1.00)
GFR, Estimated: >60 mL/min (ref >60)
Glucose, Bld: 90 mg/dL (ref 70–99)
Potassium: 4.6 mmol/L (ref 3.5–5.1)
Sodium: 141 mmol/L (ref 135–145)

## 2022-02-17 LAB — SURGICAL PCR SCREEN
MRSA, PCR: NEGATIVE
Staphylococcus aureus: NEGATIVE

## 2022-02-20 ENCOUNTER — Encounter (HOSPITAL_COMMUNITY): Payer: Self-pay | Admitting: Physician Assistant

## 2022-02-20 ENCOUNTER — Encounter (HOSPITAL_COMMUNITY): Payer: Self-pay | Admitting: Certified Registered Nurse Anesthetist

## 2022-03-02 ENCOUNTER — Encounter (HOSPITAL_COMMUNITY): Admission: RE | Payer: Self-pay | Source: Ambulatory Visit

## 2022-03-02 ENCOUNTER — Ambulatory Visit (HOSPITAL_COMMUNITY): Admission: RE | Admit: 2022-03-02 | Payer: Medicare Other | Source: Ambulatory Visit | Admitting: Orthopedic Surgery

## 2022-03-02 SURGERY — ARTHROPLASTY, KNEE, TOTAL
Anesthesia: Spinal | Site: Knee | Laterality: Right

## 2022-04-08 ENCOUNTER — Ambulatory Visit: Payer: Self-pay | Admitting: Physician Assistant

## 2022-04-08 DIAGNOSIS — G8929 Other chronic pain: Secondary | ICD-10-CM

## 2022-04-08 NOTE — H&P (View-Only) (Signed)
TOTAL KNEE ADMISSION H&P  Patient is being admitted for right total knee arthroplasty.  Subjective:  Chief Complaint:right knee pain.  HPI: Jacqueline Nash, 81 y.o. female, has a history of pain and functional disability in the right knee due to arthritis and has failed non-surgical conservative treatments for greater than 12 weeks to includeNSAID's and/or analgesics, corticosteriod injections, viscosupplementation injections, use of assistive devices, and activity modification.  Onset of symptoms was gradual, starting >10 years ago with gradually worsening course since that time. The patient noted no past surgery on the right knee(s).  Patient currently rates pain in the right knee(s) at 9 out of 10 with activity. Patient has night pain, worsening of pain with activity and weight bearing, pain that interferes with activities of daily living, pain with passive range of motion, crepitus, and joint swelling.  Patient has evidence of periarticular osteophytes and joint space narrowing by imaging studies.  There is no active infection.  Patient Active Problem List   Diagnosis Date Noted   Osteoarthritis of left hip 06/23/2021   Contact dermatitis due to metals 02/12/2020   Hymenoptera allergy 02/12/2020   Other allergic rhinitis 02/12/2020   Adverse food reaction 02/12/2020   Past Medical History:  Diagnosis Date   Arthritis    Cancer ([place])    Depression    Hyperlipidemia    Hypertension    no htn meds in years per pt   Hypothyroid    Insomnia    Migraine    Osteoporosis    Peripheral neuropathy    PONV (postoperative nausea and vomiting)    Scoliosis    Thyroid cancer ([place]) 1970    Past Surgical History:  Procedure Laterality Date   CARPAL TUNNEL RELEASE Right    CATARACT EXTRACTION     CHOLECYSTECTOMY     HARDWARE REMOVAL     right toe   INJECTION KNEE Right 06/23/2021   Procedure: KNEE INJECTION;  Surgeon: Willaim Sheng, MD;  Location: [facility];  Service:  Orthopedics;  Laterality: Right;   right toe fusion     THYROIDECTOMY     TONSILLECTOMY     TOTAL HIP ARTHROPLASTY Left 06/23/2021   Procedure: TOTAL HIP ARTHROPLASTY;  Surgeon: Willaim Sheng, MD;  Location: [facility];  Service: Orthopedics;  Laterality: Left;    Current Outpatient Medications  Medication Sig Dispense Refill Last Dose   Ascorbic Acid (VITAMIN C) 1000 MG tablet Take 1,000 mg by mouth daily.      b complex vitamins capsule Take 1 capsule by mouth daily.      dicyclomine (BENTYL) 10 MG capsule Take 10 mg by mouth daily.      EPINEPHrine 0.3 mg/0.3 mL IJ SOAJ injection Inject 0.3 mg into the muscle as needed for anaphylaxis. As needed for life-threatening allergic reactions 2 each 1    famotidine (PEPCID) 10 MG tablet Take 10 mg by mouth daily.      levothyroxine (SYNTHROID, LEVOTHROID) 75 MCG tablet Take 75 mcg by mouth daily before breakfast.      meloxicam (MOBIC) 15 MG tablet Take 15 mg by mouth daily.      mirtazapine (REMERON) 7.5 MG tablet Take 7.5 mg by mouth at bedtime.      montelukast (SINGULAIR) 10 MG tablet Take 10 mg by mouth at bedtime.      polyethylene glycol (MIRALAX / GLYCOLAX) 17 g packet Take 17 g by mouth daily as needed for moderate constipation.      pregabalin (LYRICA) 150 MG  capsule Take 150 mg by mouth 2 (two) times daily.      rosuvastatin (CRESTOR) 20 MG tablet Take 20 mg by mouth at bedtime.      sertraline (ZOLOFT) 100 MG tablet Take 100 mg by mouth daily.      SUMAtriptan (IMITREX) 50 MG tablet Take 50 mg by mouth every 2 (two) hours as needed for migraine or headache. May repeat in 2 hours if headache persists or recurs.      TRIAMCINOLONE ACETONIDE EX Apply 1 application topically daily as needed (eczema).      VITAMIN E PO Take 1 tablet by mouth daily.      No current facility-administered medications for this visit.   Allergies  Allergen Reactions   Sulfa Antibiotics Hives   Demerol [Meperidine] Hives    Social History   Tobacco  Use   Smoking status: Former   Smokeless tobacco: Never  Substance Use Topics   Alcohol use: Yes    Alcohol/week: 3.0 standard drinks of alcohol    Types: 3 Glasses of wine per week    Comment: occ glass of wine    Family History  Problem Relation Age of Onset   Asthma Daughter    Asthma Brother    Allergic rhinitis Neg Hx    Angioedema Neg Hx    Atopy Neg Hx    Eczema Neg Hx    Immunodeficiency Neg Hx    Urticaria Neg Hx      Review of Systems  Respiratory:  Negative for chest tightness and shortness of breath.   Cardiovascular:  Negative for chest pain.  Gastrointestinal:  Negative for abdominal pain, nausea and vomiting.  Musculoskeletal:  Positive for arthralgias.  Neurological:  Negative for dizziness and light-headedness.  All other systems reviewed and are negative.   Objective:  Physical Exam Constitutional:      General: She is not in acute distress.    Appearance: Normal appearance.  HENT:     Head: Normocephalic and atraumatic.  Eyes:     Extraocular Movements: Extraocular movements intact.     Pupils: Pupils are equal, round, and reactive to light.  Cardiovascular:     Rate and Rhythm: Normal rate and regular rhythm.     Pulses: Normal pulses.     Heart sounds: Normal heart sounds.  Pulmonary:     Effort: Pulmonary effort is normal. No respiratory distress.     Breath sounds: Normal breath sounds. No wheezing.  Abdominal:     General: Abdomen is flat. Bowel sounds are normal. There is no distension.     Palpations: Abdomen is soft.     Tenderness: There is no abdominal tenderness.  Musculoskeletal:     Cervical back: Normal range of motion and neck supple.     Comments: 0-120 degrees of range of motion of the right knee.  Valgus deformity.  No effusion.  Medial and lateral joint line tenderness.  Sensation diminished to light touch due to polyneuropathy.  Positive PT pulse.    Lymphadenopathy:     Cervical: No cervical adenopathy.  Skin:     General: Skin is warm and dry.     Findings: No erythema or rash.  Neurological:     General: No focal deficit present.     Mental Status: She is alert and oriented to person, place, and time.  Psychiatric:        Mood and Affect: Mood normal.        Behavior: Behavior normal.  Vital signs in last 24 hours: '@VSRANGES'$ @  Labs:   Estimated body mass index is 25.97 kg/m as calculated from the following:   Height as of 02/17/22: '5\' 2"'$  (1.575 m).   Weight as of 02/17/22: 64.4 kg.   Imaging Review Plain radiographs demonstrate severe degenerative joint disease of the right knee(s). The overall alignment issignificant valgus. The bone quality appears to be good for age and reported activity level.      Assessment/Plan:  End stage arthritis, right knee   The patient history, physical examination, clinical judgment of the provider and imaging studies are consistent with end stage degenerative joint disease of the right knee(s) and total knee arthroplasty is deemed medically necessary. The treatment options including medical management, injection therapy arthroscopy and arthroplasty were discussed at length. The risks and benefits of total knee arthroplasty were presented and reviewed. The risks due to aseptic loosening, infection, stiffness, patella tracking problems, thromboembolic complications and other imponderables were discussed. The patient acknowledged the explanation, agreed to proceed with the plan and consent was signed. Patient is being admitted for inpatient treatment for surgery, pain control, PT, OT, prophylactic antibiotics, VTE prophylaxis, progressive ambulation and ADL's and discharge planning. The patient is planning to be discharged  home with outpt PT.     Patient's anticipated LOS is less than 2 midnights, meeting these requirements: - Younger than 43 - Lives within 1 hour of care - Has a competent adult at home to recover with post-op recover - NO history  of  - Chronic pain requiring opiods  - Diabetes  - Coronary Artery Disease  - Heart failure  - Heart attack  - Stroke  - DVT/VTE  - Cardiac arrhythmia  - Respiratory Failure/COPD  - Renal failure  - Anemia  - Advanced Liver disease

## 2022-04-08 NOTE — H&P (Signed)
TOTAL KNEE ADMISSION H&P  Patient is being admitted for right total knee arthroplasty.  Subjective:  Chief Complaint:right knee pain.  HPI: Jacqueline Nash, 81 y.o. female, has a history of pain and functional disability in the right knee due to arthritis and has failed non-surgical conservative treatments for greater than 12 weeks to includeNSAID's and/or analgesics, corticosteriod injections, viscosupplementation injections, use of assistive devices, and activity modification.  Onset of symptoms was gradual, starting >10 years ago with gradually worsening course since that time. The patient noted no past surgery on the right knee(s).  Patient currently rates pain in the right knee(s) at 9 out of 10 with activity. Patient has night pain, worsening of pain with activity and weight bearing, pain that interferes with activities of daily living, pain with passive range of motion, crepitus, and joint swelling.  Patient has evidence of periarticular osteophytes and joint space narrowing by imaging studies.  There is no active infection.  Patient Active Problem List   Diagnosis Date Noted   Osteoarthritis of left hip 06/23/2021   Contact dermatitis due to metals 02/12/2020   Hymenoptera allergy 02/12/2020   Other allergic rhinitis 02/12/2020   Adverse food reaction 02/12/2020   Past Medical History:  Diagnosis Date   Arthritis    Cancer ([place])    Depression    Hyperlipidemia    Hypertension    no htn meds in years per pt   Hypothyroid    Insomnia    Migraine    Osteoporosis    Peripheral neuropathy    PONV (postoperative nausea and vomiting)    Scoliosis    Thyroid cancer ([place]) 1970    Past Surgical History:  Procedure Laterality Date   CARPAL TUNNEL RELEASE Right    CATARACT EXTRACTION     CHOLECYSTECTOMY     HARDWARE REMOVAL     right toe   INJECTION KNEE Right 06/23/2021   Procedure: KNEE INJECTION;  Surgeon: Willaim Sheng, MD;  Location: [facility];  Service:  Orthopedics;  Laterality: Right;   right toe fusion     THYROIDECTOMY     TONSILLECTOMY     TOTAL HIP ARTHROPLASTY Left 06/23/2021   Procedure: TOTAL HIP ARTHROPLASTY;  Surgeon: Willaim Sheng, MD;  Location: [facility];  Service: Orthopedics;  Laterality: Left;    Current Outpatient Medications  Medication Sig Dispense Refill Last Dose   Ascorbic Acid (VITAMIN C) 1000 MG tablet Take 1,000 mg by mouth daily.      b complex vitamins capsule Take 1 capsule by mouth daily.      dicyclomine (BENTYL) 10 MG capsule Take 10 mg by mouth daily.      EPINEPHrine 0.3 mg/0.3 mL IJ SOAJ injection Inject 0.3 mg into the muscle as needed for anaphylaxis. As needed for life-threatening allergic reactions 2 each 1    famotidine (PEPCID) 10 MG tablet Take 10 mg by mouth daily.      levothyroxine (SYNTHROID, LEVOTHROID) 75 MCG tablet Take 75 mcg by mouth daily before breakfast.      meloxicam (MOBIC) 15 MG tablet Take 15 mg by mouth daily.      mirtazapine (REMERON) 7.5 MG tablet Take 7.5 mg by mouth at bedtime.      montelukast (SINGULAIR) 10 MG tablet Take 10 mg by mouth at bedtime.      polyethylene glycol (MIRALAX / GLYCOLAX) 17 g packet Take 17 g by mouth daily as needed for moderate constipation.      pregabalin (LYRICA) 150 MG  capsule Take 150 mg by mouth 2 (two) times daily.      rosuvastatin (CRESTOR) 20 MG tablet Take 20 mg by mouth at bedtime.      sertraline (ZOLOFT) 100 MG tablet Take 100 mg by mouth daily.      SUMAtriptan (IMITREX) 50 MG tablet Take 50 mg by mouth every 2 (two) hours as needed for migraine or headache. May repeat in 2 hours if headache persists or recurs.      TRIAMCINOLONE ACETONIDE EX Apply 1 application topically daily as needed (eczema).      VITAMIN E PO Take 1 tablet by mouth daily.      No current facility-administered medications for this visit.   Allergies  Allergen Reactions   Sulfa Antibiotics Hives   Demerol [Meperidine] Hives    Social History   Tobacco  Use   Smoking status: Former   Smokeless tobacco: Never  Substance Use Topics   Alcohol use: Yes    Alcohol/week: 3.0 standard drinks of alcohol    Types: 3 Glasses of wine per week    Comment: occ glass of wine    Family History  Problem Relation Age of Onset   Asthma Daughter    Asthma Brother    Allergic rhinitis Neg Hx    Angioedema Neg Hx    Atopy Neg Hx    Eczema Neg Hx    Immunodeficiency Neg Hx    Urticaria Neg Hx      Review of Systems  Respiratory:  Negative for chest tightness and shortness of breath.   Cardiovascular:  Negative for chest pain.  Gastrointestinal:  Negative for abdominal pain, nausea and vomiting.  Musculoskeletal:  Positive for arthralgias.  Neurological:  Negative for dizziness and light-headedness.  All other systems reviewed and are negative.   Objective:  Physical Exam Constitutional:      General: She is not in acute distress.    Appearance: Normal appearance.  HENT:     Head: Normocephalic and atraumatic.  Eyes:     Extraocular Movements: Extraocular movements intact.     Pupils: Pupils are equal, round, and reactive to light.  Cardiovascular:     Rate and Rhythm: Normal rate and regular rhythm.     Pulses: Normal pulses.     Heart sounds: Normal heart sounds.  Pulmonary:     Effort: Pulmonary effort is normal. No respiratory distress.     Breath sounds: Normal breath sounds. No wheezing.  Abdominal:     General: Abdomen is flat. Bowel sounds are normal. There is no distension.     Palpations: Abdomen is soft.     Tenderness: There is no abdominal tenderness.  Musculoskeletal:     Cervical back: Normal range of motion and neck supple.     Comments: 0-120 degrees of range of motion of the right knee.  Valgus deformity.  No effusion.  Medial and lateral joint line tenderness.  Sensation diminished to light touch due to polyneuropathy.  Positive PT pulse.    Lymphadenopathy:     Cervical: No cervical adenopathy.  Skin:     General: Skin is warm and dry.     Findings: No erythema or rash.  Neurological:     General: No focal deficit present.     Mental Status: She is alert and oriented to person, place, and time.  Psychiatric:        Mood and Affect: Mood normal.        Behavior: Behavior normal.  Vital signs in last 24 hours: '@VSRANGES'$ @  Labs:   Estimated body mass index is 25.97 kg/m as calculated from the following:   Height as of 02/17/22: '5\' 2"'$  (1.575 m).   Weight as of 02/17/22: 64.4 kg.   Imaging Review Plain radiographs demonstrate severe degenerative joint disease of the right knee(s). The overall alignment issignificant valgus. The bone quality appears to be good for age and reported activity level.      Assessment/Plan:  End stage arthritis, right knee   The patient history, physical examination, clinical judgment of the provider and imaging studies are consistent with end stage degenerative joint disease of the right knee(s) and total knee arthroplasty is deemed medically necessary. The treatment options including medical management, injection therapy arthroscopy and arthroplasty were discussed at length. The risks and benefits of total knee arthroplasty were presented and reviewed. The risks due to aseptic loosening, infection, stiffness, patella tracking problems, thromboembolic complications and other imponderables were discussed. The patient acknowledged the explanation, agreed to proceed with the plan and consent was signed. Patient is being admitted for inpatient treatment for surgery, pain control, PT, OT, prophylactic antibiotics, VTE prophylaxis, progressive ambulation and ADL's and discharge planning. The patient is planning to be discharged  home with outpt PT.     Patient's anticipated LOS is less than 2 midnights, meeting these requirements: - Younger than 8 - Lives within 1 hour of care - Has a competent adult at home to recover with post-op recover - NO history  of  - Chronic pain requiring opiods  - Diabetes  - Coronary Artery Disease  - Heart failure  - Heart attack  - Stroke  - DVT/VTE  - Cardiac arrhythmia  - Respiratory Failure/COPD  - Renal failure  - Anemia  - Advanced Liver disease

## 2022-04-08 NOTE — Progress Notes (Signed)
Surgery orders requested via Epic inbox. °

## 2022-04-14 NOTE — Patient Instructions (Signed)
DUE TO COVID-19 ONLY TWO VISITORS  (aged 82 and older)  ARE ALLOWED TO COME WITH YOU AND STAY IN THE WAITING ROOM ONLY DURING PRE OP AND PROCEDURE.   **NO VISITORS ARE ALLOWED IN THE SHORT STAY AREA OR RECOVERY ROOM!!**  IF YOU WILL BE ADMITTED INTO THE HOSPITAL YOU ARE ALLOWED ONLY FOUR SUPPORT PEOPLE DURING VISITATION HOURS ONLY (7 AM -8PM)   The support person(s) must pass our screening, gel in and out, and wear a mask at all times, including in the patient's room. Patients must also wear a mask when staff or their support person are in the room. Visitors GUEST BADGE MUST BE WORN VISIBLY  One adult visitor may remain with you overnight and MUST be in the room by 8 P.M.     Your procedure is scheduled on: 04/27/22   Report to [facility] Main Entrance    Report to admitting at : 7:45 AM   Call this number if you have problems the morning of surgery [phone]   Do not eat food :After Midnight.   After Midnight you may have the following liquids until : 7:00 AM DAY OF SURGERY  Water Black Coffee (sugar ok, NO MILK/CREAM OR CREAMERS)  Tea (sugar ok, NO MILK/CREAM OR CREAMERS) regular and decaf                             Plain Jell-O (NO RED)                                           Fruit ices (not with fruit pulp, NO RED)                                     Popsicles (NO RED)                                                                  Juice: apple, WHITE grape, WHITE cranberry Sports drinks like Gatorade (NO RED)   The day of surgery:  Drink ONE (1) Pre-Surgery Clear Ensure or G2 at: 7:00 AM the morning of surgery. Drink in one sitting. Do not sip.  This drink was given to you during your hospital  pre-op appointment visit. Nothing else to drink after completing the  Pre-Surgery Clear Ensure or G2.          If you have questions, please contact your surgeon's office.  Oral Hygiene is also important to reduce your risk of infection.                                     Remember - BRUSH YOUR TEETH THE MORNING OF SURGERY WITH YOUR REGULAR TOOTHPASTE  DENTURES WILL BE REMOVED PRIOR TO SURGERY PLEASE DO NOT APPLY "Poly grip" OR ADHESIVES!!!   Do NOT smoke after Midnight   Take these medicines the morning of surgery with A SIP OF WATER: pregabalin,sertraline,levothyroxine,bentyl,famotidine.Imitrex as needed.  You may not have any metal on your body including hair pins, jewelry, and body piercing             Do not wear make-up, lotions, powders, perfumes/cologne, or deodorant  Do not wear nail polish including gel and S&S, artificial/acrylic nails, or any other type of covering on natural nails including finger and toenails. If you have artificial nails, gel coating, etc. that needs to be removed by a nail salon please have this removed prior to surgery or surgery may need to be canceled/ delayed if the surgeon/ anesthesia feels like they are unable to be safely monitored.   Do not shave  48 hours prior to surgery.    Do not bring valuables to the hospital. [place].   Contacts, glasses, or bridgework may not be worn into surgery.   Bring small overnight bag day of surgery.   DO NOT [place]. PHARMACY WILL DISPENSE MEDICATIONS LISTED ON YOUR MEDICATION LIST TO YOU DURING YOUR ADMISSION [place]!    Patients discharged on the day of surgery will not be allowed to drive home.  Someone NEEDS to stay with you for the first 24 hours after anesthesia.   Special Instructions: Bring a copy of your healthcare power of attorney and living will documents         the day of surgery if you haven't scanned them before.              Please read over the following fact sheets you were given: IF YOU HAVE QUESTIONS ABOUT YOUR PRE-OP INSTRUCTIONS PLEASE CALL [phone]    [facility] - Preparing for Surgery Before surgery, you can play an  important role.  Because skin is not sterile, your skin needs to be as free of germs as possible.  You can reduce the number of germs on your skin by washing with CHG (chlorahexidine gluconate) soap before surgery.  CHG is an antiseptic cleaner which kills germs and bonds with the skin to continue killing germs even after washing. Please DO NOT use if you have an allergy to CHG or antibacterial soaps.  If your skin becomes reddened/irritated stop using the CHG and inform your nurse when you arrive at Short Stay. Do not shave (including legs and underarms) for at least 48 hours prior to the first CHG shower.  You may shave your face/neck. Please follow these instructions carefully:  1.  Shower with CHG Soap the night before surgery and the  morning of Surgery.  2.  If you choose to wash your hair, wash your hair first as usual with your  normal  shampoo.  3.  After you shampoo, rinse your hair and body thoroughly to remove the  shampoo.                           4.  Use CHG as you would any other liquid soap.  You can apply chg directly  to the skin and wash                       Gently with a scrungie or clean washcloth.  5.  Apply the CHG Soap to your body ONLY FROM THE NECK DOWN.   Do not use on face/ open  Wound or open sores. Avoid contact with eyes, ears mouth and genitals (private parts).                       Wash face,  Genitals (private parts) with your normal soap.             6.  Wash thoroughly, paying special attention to the area where your surgery  will be performed.  7.  Thoroughly rinse your body with warm water from the neck down.  8.  DO NOT shower/wash with your normal soap after using and rinsing off  the CHG Soap.                9.  Pat yourself dry with a clean towel.            10.  Wear clean pajamas.            11.  Place clean sheets on your bed the night of your first shower and do not  sleep with pets. Day of Surgery : Do not apply any  lotions/deodorants the morning of surgery.  Please wear clean clothes to the hospital/surgery center.  FAILURE TO FOLLOW THESE INSTRUCTIONS MAY RESULT IN THE CANCELLATION OF YOUR SURGERY PATIENT SIGNATURE_________________________________  NURSE SIGNATURE__________________________________  ________________________________________________________________________  Jacqueline Nash  An incentive spirometer is a tool that can help keep your lungs clear and active. This tool measures how well you are filling your lungs with each breath. Taking long deep breaths may help reverse or decrease the chance of developing breathing (pulmonary) problems (especially infection) following: A long period of time when you are unable to move or be active. BEFORE THE PROCEDURE  If the spirometer includes an indicator to show your best effort, your nurse or respiratory therapist will set it to a desired goal. If possible, sit up straight or lean slightly forward. Try not to slouch. Hold the incentive spirometer in an upright position. INSTRUCTIONS FOR USE  Sit on the edge of your bed if possible, or sit up as far as you can in bed or on a chair. Hold the incentive spirometer in an upright position. Breathe out normally. Place the mouthpiece in your mouth and seal your lips tightly around it. Breathe in slowly and as deeply as possible, raising the piston or the ball toward the top of the column. Hold your breath for 3-5 seconds or for as long as possible. Allow the piston or ball to fall to the bottom of the column. Remove the mouthpiece from your mouth and breathe out normally. Rest for a few seconds and repeat Steps 1 through 7 at least 10 times every 1-2 hours when you are awake. Take your time and take a few normal breaths between deep breaths. The spirometer may include an indicator to show your best effort. Use the indicator as a goal to work toward during each repetition. After each set of 10 deep  breaths, practice coughing to be sure your lungs are clear. If you have an incision (the cut made at the time of surgery), support your incision when coughing by placing a pillow or rolled up towels firmly against it. Once you are able to get out of bed, walk around indoors and cough well. You may stop using the incentive spirometer when instructed by your caregiver.  RISKS AND COMPLICATIONS Take your time so you do not get dizzy or light-headed. If you are in pain, you may need to take or ask  for pain medication before doing incentive spirometry. It is harder to take a deep breath if you are having pain. AFTER USE Rest and breathe slowly and easily. It can be helpful to keep track of a log of your progress. Your caregiver can provide you with a simple table to help with this. If you are using the spirometer at home, follow these instructions: [place] IF:  You are having difficultly using the spirometer. You have trouble using the spirometer as often as instructed. Your pain medication is not giving enough relief while using the spirometer. You develop fever of 100.5 F (38.1 C) or higher. SEEK IMMEDIATE MEDICAL CARE IF:  You cough up bloody sputum that had not been present before. You develop fever of 102 F (38.9 C) or greater. You develop worsening pain at or near the incision site. MAKE SURE YOU:  Understand these instructions. Will watch your condition. Will get help right away if you are not doing well or get worse. Document Released: 08/10/2006 Document Revised: 06/22/2011 Document Reviewed: 10/11/2006 [facility] Patient Information 2014 [place], [place].   ________________________________________________________________________

## 2022-04-15 ENCOUNTER — Encounter (HOSPITAL_COMMUNITY)
Admission: RE | Admit: 2022-04-15 | Discharge: 2022-04-15 | Disposition: A | Discharge status: Home / Self Care | Payer: Medicare Other | Source: Ambulatory Visit | Attending: Orthopedic Surgery | Admitting: Orthopedic Surgery

## 2022-04-15 ENCOUNTER — Other Ambulatory Visit: Payer: Self-pay

## 2022-04-15 ENCOUNTER — Encounter (HOSPITAL_COMMUNITY): Payer: Self-pay

## 2022-04-15 VITALS — BP 142/91 | HR 86 | Temp 97.7°F | Ht 62.0 in | Wt 142.0 lb

## 2022-04-15 DIAGNOSIS — G8929 Other chronic pain: Secondary | ICD-10-CM | POA: Insufficient documentation

## 2022-04-15 DIAGNOSIS — M25561 Pain in right knee: Secondary | ICD-10-CM | POA: Diagnosis not present

## 2022-04-15 DIAGNOSIS — Z01812 Encounter for preprocedural laboratory examination: Secondary | ICD-10-CM | POA: Insufficient documentation

## 2022-04-15 DIAGNOSIS — Z01818 Encounter for other preprocedural examination: Secondary | ICD-10-CM

## 2022-04-15 LAB — CBC WITH DIFFERENTIAL/PLATELET
Abs Immature Granulocytes: 0.02 10*3/uL (ref 0.00–0.07)
Basophils Absolute: 0 10*3/uL (ref 0.0–0.1)
Basophils Relative: 1 %
Eosinophils Absolute: 0.2 10*3/uL (ref 0.0–0.5)
Eosinophils Relative: 3 %
HCT: 41.1 % (ref 36.0–46.0)
Hemoglobin: 13.3 g/dL (ref 12.0–15.0)
Immature Granulocytes: 0 %
Lymphocytes Relative: 24 %
Lymphs Abs: 1.5 10*3/uL (ref 0.7–4.0)
MCH: 31.4 pg (ref 26.0–34.0)
MCHC: 32.4 g/dL (ref 30.0–36.0)
MCV: 96.9 fL (ref 80.0–100.0)
Monocytes Absolute: 0.6 10*3/uL (ref 0.1–1.0)
Monocytes Relative: 9 %
Neutro Abs: 4 10*3/uL (ref 1.7–7.7)
Neutrophils Relative %: 63 %
Platelets: 274 10*3/uL (ref 150–400)
RBC: 4.24 MIL/uL (ref 3.87–5.11)
RDW: 13.2 % (ref 11.5–15.5)
WBC: 6.3 10*3/uL (ref 4.0–10.5)
nRBC: 0 % (ref 0.0–0.2)

## 2022-04-15 LAB — TYPE AND SCREEN
ABO/RH(D): O POS
Antibody Screen: NEGATIVE

## 2022-04-15 LAB — COMPREHENSIVE METABOLIC PANEL
ALT: 17 U/L (ref 0–44)
AST: 21 U/L (ref 15–41)
Albumin: 4.1 g/dL (ref 3.5–5.0)
Alkaline Phosphatase: 57 U/L (ref 38–126)
Anion gap: 6 (ref 5–15)
BUN: 27 mg/dL — ABNORMAL HIGH (ref 8–23)
CO2: 25 mmol/L (ref 22–32)
Calcium: 9.7 mg/dL (ref 8.9–10.3)
Chloride: 110 mmol/L (ref 98–111)
Creatinine, Ser: 0.69 mg/dL (ref 0.44–1.00)
GFR, Estimated: >60 mL/min (ref >60)
Glucose, Bld: 90 mg/dL (ref 70–99)
Potassium: 4.2 mmol/L (ref 3.5–5.1)
Sodium: 141 mmol/L (ref 135–145)
Total Bilirubin: 0.4 mg/dL (ref 0.3–1.2)
Total Protein: 6.5 g/dL (ref 6.5–8.1)

## 2022-04-15 LAB — SURGICAL PCR SCREEN
MRSA, PCR: NEGATIVE
Staphylococcus aureus: NEGATIVE

## 2022-04-15 NOTE — Progress Notes (Addendum)
For Short Stay: [place] appointment date:  Bowel Prep reminder:   For Anesthesia: PCP - Dr. Harvie Bridge : Clearance: chart. Cardiologist -   Chest x-ray -  EKG - 02/17/22 Stress Test -  ECHO -  Cardiac Cath -  Pacemaker/ICD device last checked: Pacemaker orders received: Device Rep notified:  Spinal Cord Stimulator:  Sleep Study -  CPAP -   Fasting Blood Sugar -  Checks Blood Sugar _____ times a day Date and result of last Hgb A1c-  Last dose of GLP1 agonist-  GLP1 instructions:   Last dose of SGLT-2 inhibitors-  SGLT-2 instructions:   Blood Thinner Instructions: Aspirin Instructions: Last Dose:  Activity level: Can go up a flight of stairs and activities of daily living without stopping and without chest pain and/or shortness of breath   Able to exercise without chest pain and/or shortness of breath  Anesthesia review: Hx: HTN  Patient denies shortness of breath, fever, cough and chest pain at PAT appointment   Patient verbalized understanding of instructions that were given to them at the PAT appointment. Patient was also instructed that they will need to review over the PAT instructions again at home before surgery.

## 2022-04-27 ENCOUNTER — Observation Stay (HOSPITAL_COMMUNITY): Payer: Medicare Other

## 2022-04-27 ENCOUNTER — Observation Stay (HOSPITAL_COMMUNITY)
Admission: RE | Admit: 2022-04-27 | Discharge: 2022-04-28 | Disposition: A | Discharge status: Home / Self Care | Payer: Medicare Other | Source: Ambulatory Visit | Attending: Orthopedic Surgery | Admitting: Orthopedic Surgery

## 2022-04-27 ENCOUNTER — Other Ambulatory Visit: Payer: Self-pay

## 2022-04-27 ENCOUNTER — Ambulatory Visit (HOSPITAL_COMMUNITY): Payer: Medicare Other | Admitting: Anesthesiology

## 2022-04-27 ENCOUNTER — Ambulatory Visit (HOSPITAL_BASED_OUTPATIENT_CLINIC_OR_DEPARTMENT_OTHER): Payer: Medicare Other | Admitting: Anesthesiology

## 2022-04-27 ENCOUNTER — Encounter (HOSPITAL_COMMUNITY)
Admission: RE | Disposition: A | Discharge status: Home / Self Care | Payer: Self-pay | Source: Ambulatory Visit | Attending: Orthopedic Surgery

## 2022-04-27 ENCOUNTER — Encounter (HOSPITAL_COMMUNITY): Payer: Self-pay | Admitting: Orthopedic Surgery

## 2022-04-27 DIAGNOSIS — Z85850 Personal history of malignant neoplasm of thyroid: Secondary | ICD-10-CM | POA: Diagnosis not present

## 2022-04-27 DIAGNOSIS — Z79899 Other long term (current) drug therapy: Secondary | ICD-10-CM | POA: Diagnosis not present

## 2022-04-27 DIAGNOSIS — I10 Essential (primary) hypertension: Secondary | ICD-10-CM | POA: Diagnosis not present

## 2022-04-27 DIAGNOSIS — M1711 Unilateral primary osteoarthritis, right knee: Secondary | ICD-10-CM | POA: Diagnosis not present

## 2022-04-27 DIAGNOSIS — Z96642 Presence of left artificial hip joint: Secondary | ICD-10-CM | POA: Diagnosis not present

## 2022-04-27 DIAGNOSIS — Z87891 Personal history of nicotine dependence: Secondary | ICD-10-CM | POA: Diagnosis not present

## 2022-04-27 DIAGNOSIS — E039 Hypothyroidism, unspecified: Secondary | ICD-10-CM | POA: Insufficient documentation

## 2022-04-27 DIAGNOSIS — Z01818 Encounter for other preprocedural examination: Secondary | ICD-10-CM

## 2022-04-27 HISTORY — PX: TOTAL KNEE ARTHROPLASTY: SHX125

## 2022-04-27 SURGERY — ARTHROPLASTY, KNEE, TOTAL
Anesthesia: Spinal | Site: Knee | Laterality: Right

## 2022-04-27 MED ORDER — METHOCARBAMOL 500 MG IVPB - SIMPLE MED
INTRAVENOUS | Status: AC
  Filled 2022-04-27: qty 55

## 2022-04-27 MED ORDER — OXYCODONE HCL 5 MG PO TABS
5.0000 mg | ORAL_TABLET | ORAL | Status: DC | PRN
  Administered 2022-04-27 – 2022-04-28 (×3): 5 mg via ORAL
  Filled 2022-04-27 (×2): qty 1

## 2022-04-27 MED ORDER — PANTOPRAZOLE SODIUM 40 MG PO TBEC
40.0000 mg | DELAYED_RELEASE_TABLET | Freq: Every day | ORAL | Status: DC
  Administered 2022-04-27 – 2022-04-28 (×2): 40 mg via ORAL
  Filled 2022-04-27 (×2): qty 1

## 2022-04-27 MED ORDER — DEXAMETHASONE SODIUM PHOSPHATE 10 MG/ML IJ SOLN
INTRAMUSCULAR | Status: AC
  Filled 2022-04-27: qty 1

## 2022-04-27 MED ORDER — POVIDONE-IODINE 10 % EX SWAB: 2.0000 | Freq: Once | CUTANEOUS | Status: DC

## 2022-04-27 MED ORDER — LACTATED RINGERS IV SOLN: INTRAVENOUS | Status: DC

## 2022-04-27 MED ORDER — BUPIVACAINE LIPOSOME 1.3 % IJ SUSP: 20.0000 mL | Freq: Once | INTRAMUSCULAR | Status: DC

## 2022-04-27 MED ORDER — SODIUM CHLORIDE 0.9 % IR SOLN
Status: DC | PRN
  Administered 2022-04-27: 3000 mL

## 2022-04-27 MED ORDER — PROPOFOL 10 MG/ML IV BOLUS
INTRAVENOUS | Status: DC | PRN
  Administered 2022-04-27: 10 mg via INTRAVENOUS

## 2022-04-27 MED ORDER — ONDANSETRON HCL 4 MG/2ML IJ SOLN
INTRAMUSCULAR | Status: DC | PRN
  Administered 2022-04-27: 4 mg via INTRAVENOUS

## 2022-04-27 MED ORDER — PREGABALIN 75 MG PO CAPS
150.0000 mg | ORAL_CAPSULE | Freq: Two times a day (BID) | ORAL | Status: DC
  Administered 2022-04-27 – 2022-04-28 (×2): 150 mg via ORAL
  Filled 2022-04-27 (×2): qty 2

## 2022-04-27 MED ORDER — ISOPROPYL ALCOHOL 70 % SOLN
Status: DC | PRN
  Administered 2022-04-27: 1 via TOPICAL

## 2022-04-27 MED ORDER — SODIUM CHLORIDE (PF) 0.9 % IJ SOLN
INTRAMUSCULAR | Status: AC
  Filled 2022-04-27: qty 10

## 2022-04-27 MED ORDER — ACETAMINOPHEN 500 MG PO TABS: 1000.0000 mg | ORAL_TABLET | Freq: Once | ORAL | Status: DC

## 2022-04-27 MED ORDER — MIDAZOLAM HCL 2 MG/2ML IJ SOLN
2.0000 mg | INTRAMUSCULAR | Status: DC
  Filled 2022-04-27: qty 2

## 2022-04-27 MED ORDER — AMISULPRIDE (ANTIEMETIC) 5 MG/2ML IV SOLN: 10.0000 mg | Freq: Once | INTRAVENOUS | Status: DC | PRN

## 2022-04-27 MED ORDER — HYDROMORPHONE HCL 1 MG/ML IJ SOLN
0.5000 mg | INTRAMUSCULAR | Status: DC | PRN
  Administered 2022-04-27: 1 mg via INTRAVENOUS
  Filled 2022-04-27: qty 1

## 2022-04-27 MED ORDER — CEFAZOLIN SODIUM-DEXTROSE 2-4 GM/100ML-% IV SOLN
2.0000 g | INTRAVENOUS | Status: AC
  Administered 2022-04-27: 2 g via INTRAVENOUS
  Filled 2022-04-27: qty 100

## 2022-04-27 MED ORDER — ZOLPIDEM TARTRATE 5 MG PO TABS: 5.0000 mg | ORAL_TABLET | Freq: Every evening | ORAL | Status: DC | PRN

## 2022-04-27 MED ORDER — PHENYLEPHRINE HCL-NACL 20-0.9 MG/250ML-% IV SOLN
INTRAVENOUS | Status: DC | PRN
  Administered 2022-04-27: 10 ug/min via INTRAVENOUS

## 2022-04-27 MED ORDER — ONDANSETRON HCL 4 MG/2ML IJ SOLN: 4.0000 mg | Freq: Four times a day (QID) | INTRAMUSCULAR | Status: DC | PRN

## 2022-04-27 MED ORDER — CEFAZOLIN SODIUM-DEXTROSE 2-4 GM/100ML-% IV SOLN
2.0000 g | Freq: Four times a day (QID) | INTRAVENOUS | Status: AC
  Administered 2022-04-27 (×2): 2 g via INTRAVENOUS
  Filled 2022-04-27 (×2): qty 100

## 2022-04-27 MED ORDER — ASPIRIN 81 MG PO CHEW
81.0000 mg | CHEWABLE_TABLET | Freq: Two times a day (BID) | ORAL | Status: DC
  Administered 2022-04-27 – 2022-04-28 (×2): 81 mg via ORAL
  Filled 2022-04-27 (×2): qty 1

## 2022-04-27 MED ORDER — CLONIDINE HCL (ANALGESIA) 100 MCG/ML EP SOLN
EPIDURAL | Status: DC | PRN
  Administered 2022-04-27: 50 ug

## 2022-04-27 MED ORDER — BUPIVACAINE IN DEXTROSE 0.75-8.25 % IT SOLN
INTRATHECAL | Status: DC | PRN
  Administered 2022-04-27: 1.6 mL via INTRATHECAL

## 2022-04-27 MED ORDER — ORAL CARE MOUTH RINSE: 15.0000 mL | Freq: Once | OROMUCOSAL | Status: AC

## 2022-04-27 MED ORDER — VITAMIN D 25 MCG (1000 UNIT) PO TABS
1000.0000 [IU] | ORAL_TABLET | Freq: Every day | ORAL | Status: DC
  Administered 2022-04-28: 1000 [IU] via ORAL
  Filled 2022-04-27: qty 1

## 2022-04-27 MED ORDER — SERTRALINE HCL 100 MG PO TABS
100.0000 mg | ORAL_TABLET | Freq: Every day | ORAL | Status: DC
  Administered 2022-04-28: 100 mg via ORAL
  Filled 2022-04-27: qty 1

## 2022-04-27 MED ORDER — ONDANSETRON HCL 4 MG PO TABS: 4.0000 mg | ORAL_TABLET | Freq: Four times a day (QID) | ORAL | Status: DC | PRN

## 2022-04-27 MED ORDER — FENTANYL CITRATE PF 50 MCG/ML IJ SOSY: 25.0000 ug | PREFILLED_SYRINGE | INTRAMUSCULAR | Status: DC | PRN

## 2022-04-27 MED ORDER — 0.9 % SODIUM CHLORIDE (POUR BTL) OPTIME
TOPICAL | Status: DC | PRN
  Administered 2022-04-27: 1000 mL

## 2022-04-27 MED ORDER — TRANEXAMIC ACID 1000 MG/10ML IV SOLN
2000.0000 mg | INTRAVENOUS | Status: DC
  Filled 2022-04-27: qty 20

## 2022-04-27 MED ORDER — PHENOL 1.4 % MT LIQD: 1.0000 | OROMUCOSAL | Status: DC | PRN

## 2022-04-27 MED ORDER — ROPIVACAINE HCL 5 MG/ML IJ SOLN
INTRAMUSCULAR | Status: DC | PRN
  Administered 2022-04-27: 20 mL via PERINEURAL

## 2022-04-27 MED ORDER — BUPIVACAINE LIPOSOME 1.3 % IJ SUSP
INTRAMUSCULAR | Status: AC
  Filled 2022-04-27: qty 20

## 2022-04-27 MED ORDER — ACETAMINOPHEN 500 MG PO TABS
1000.0000 mg | ORAL_TABLET | Freq: Once | ORAL | Status: AC
  Administered 2022-04-27: 1000 mg via ORAL
  Filled 2022-04-27: qty 2

## 2022-04-27 MED ORDER — WATER FOR IRRIGATION, STERILE IR SOLN
Status: DC | PRN
  Administered 2022-04-27: 2000 mL

## 2022-04-27 MED ORDER — DEXAMETHASONE SODIUM PHOSPHATE 10 MG/ML IJ SOLN
8.0000 mg | Freq: Once | INTRAMUSCULAR | Status: AC
  Administered 2022-04-27: 8 mg via INTRAVENOUS

## 2022-04-27 MED ORDER — B COMPLEX VITAMINS PO CAPS: 1.0000 | ORAL_CAPSULE | Freq: Every day | ORAL | Status: DC

## 2022-04-27 MED ORDER — KETOROLAC TROMETHAMINE 15 MG/ML IJ SOLN
INTRAMUSCULAR | Status: AC
  Filled 2022-04-27: qty 1

## 2022-04-27 MED ORDER — FENTANYL CITRATE PF 50 MCG/ML IJ SOSY
100.0000 ug | PREFILLED_SYRINGE | INTRAMUSCULAR | Status: DC
  Administered 2022-04-27: 50 ug via INTRAVENOUS
  Filled 2022-04-27: qty 2

## 2022-04-27 MED ORDER — DOCUSATE SODIUM 100 MG PO CAPS
100.0000 mg | ORAL_CAPSULE | Freq: Two times a day (BID) | ORAL | Status: DC
  Administered 2022-04-27 – 2022-04-28 (×2): 100 mg via ORAL
  Filled 2022-04-27 (×2): qty 1

## 2022-04-27 MED ORDER — METHOCARBAMOL 500 MG PO TABS: 500.0000 mg | ORAL_TABLET | Freq: Four times a day (QID) | ORAL | Status: DC | PRN

## 2022-04-27 MED ORDER — FAMOTIDINE 20 MG PO TABS
10.0000 mg | ORAL_TABLET | Freq: Every day | ORAL | Status: DC
  Administered 2022-04-28: 10 mg via ORAL
  Filled 2022-04-27: qty 1

## 2022-04-27 MED ORDER — LEVOTHYROXINE SODIUM 75 MCG PO TABS
75.0000 ug | ORAL_TABLET | Freq: Every day | ORAL | Status: DC
  Administered 2022-04-28: 75 ug via ORAL
  Filled 2022-04-27: qty 1

## 2022-04-27 MED ORDER — CHLORHEXIDINE GLUCONATE 0.12 % MT SOLN
15.0000 mL | Freq: Once | OROMUCOSAL | Status: AC
  Administered 2022-04-27: 15 mL via OROMUCOSAL

## 2022-04-27 MED ORDER — SODIUM CHLORIDE (PF) 0.9 % IJ SOLN
INTRAMUSCULAR | Status: DC | PRN
  Administered 2022-04-27: 60 mL

## 2022-04-27 MED ORDER — ROSUVASTATIN CALCIUM 20 MG PO TABS
40.0000 mg | ORAL_TABLET | Freq: Every day | ORAL | Status: DC
  Administered 2022-04-27: 40 mg via ORAL
  Filled 2022-04-27: qty 2

## 2022-04-27 MED ORDER — POLYETHYLENE GLYCOL 3350 17 G PO PACK: 17.0000 g | PACK | Freq: Every day | ORAL | Status: DC | PRN

## 2022-04-27 MED ORDER — PREGABALIN 75 MG PO CAPS: 150.0000 mg | ORAL_CAPSULE | Freq: Two times a day (BID) | ORAL | Status: DC

## 2022-04-27 MED ORDER — ACETAMINOPHEN 500 MG PO TABS
1000.0000 mg | ORAL_TABLET | Freq: Four times a day (QID) | ORAL | Status: AC
  Administered 2022-04-27 – 2022-04-28 (×4): 1000 mg via ORAL
  Filled 2022-04-27 (×4): qty 2

## 2022-04-27 MED ORDER — TRANEXAMIC ACID-NACL 1000-0.7 MG/100ML-% IV SOLN
1000.0000 mg | INTRAVENOUS | Status: AC
  Administered 2022-04-27: 1000 mg via INTRAVENOUS
  Filled 2022-04-27: qty 100

## 2022-04-27 MED ORDER — DICYCLOMINE HCL 10 MG PO CAPS
10.0000 mg | ORAL_CAPSULE | Freq: Three times a day (TID) | ORAL | Status: DC
  Administered 2022-04-27 – 2022-04-28 (×3): 10 mg via ORAL
  Filled 2022-04-27 (×3): qty 1

## 2022-04-27 MED ORDER — MONTELUKAST SODIUM 10 MG PO TABS
10.0000 mg | ORAL_TABLET | Freq: Every day | ORAL | Status: DC
  Administered 2022-04-27: 10 mg via ORAL
  Filled 2022-04-27: qty 1

## 2022-04-27 MED ORDER — VITAMIN C 500 MG PO TABS
1000.0000 mg | ORAL_TABLET | Freq: Every day | ORAL | Status: DC
  Administered 2022-04-28: 1000 mg via ORAL
  Filled 2022-04-27: qty 2

## 2022-04-27 MED ORDER — SODIUM CHLORIDE 0.9 % IV SOLN: INTRAVENOUS | Status: DC

## 2022-04-27 MED ORDER — ONDANSETRON HCL 4 MG/2ML IJ SOLN
INTRAMUSCULAR | Status: AC
  Filled 2022-04-27: qty 2

## 2022-04-27 MED ORDER — EPHEDRINE SULFATE (PRESSORS) 50 MG/ML IJ SOLN
INTRAMUSCULAR | Status: DC | PRN
  Administered 2022-04-27: 5 mg via INTRAVENOUS

## 2022-04-27 MED ORDER — METHOCARBAMOL 500 MG IVPB - SIMPLE MED
500.0000 mg | Freq: Four times a day (QID) | INTRAVENOUS | Status: DC | PRN
  Administered 2022-04-27: 500 mg via INTRAVENOUS

## 2022-04-27 MED ORDER — PROPOFOL 500 MG/50ML IV EMUL
INTRAVENOUS | Status: DC | PRN
  Administered 2022-04-27: 50 ug/kg/min via INTRAVENOUS

## 2022-04-27 MED ORDER — MENTHOL 3 MG MT LOZG: 1.0000 | LOZENGE | OROMUCOSAL | Status: DC | PRN

## 2022-04-27 MED ORDER — ACETAMINOPHEN 325 MG PO TABS: 325.0000 mg | ORAL_TABLET | Freq: Four times a day (QID) | ORAL | Status: DC | PRN

## 2022-04-27 MED ORDER — VITAMIN C 500 MG PO TABS: 1000.0000 mg | ORAL_TABLET | Freq: Every day | ORAL | Status: DC

## 2022-04-27 MED ORDER — SODIUM CHLORIDE (PF) 0.9 % IJ SOLN
INTRAMUSCULAR | Status: AC
  Filled 2022-04-27: qty 50

## 2022-04-27 MED ORDER — OXYCODONE HCL 5 MG PO TABS
ORAL_TABLET | ORAL | Status: AC
  Filled 2022-04-27: qty 1

## 2022-04-27 MED ORDER — B COMPLEX-C PO TABS
1.0000 | ORAL_TABLET | Freq: Every day | ORAL | Status: DC
  Administered 2022-04-28: 1 via ORAL
  Filled 2022-04-27: qty 1

## 2022-04-27 MED ORDER — DIPHENHYDRAMINE HCL 12.5 MG/5ML PO ELIX: 12.5000 mg | ORAL_SOLUTION | ORAL | Status: DC | PRN

## 2022-04-27 MED ORDER — BUPIVACAINE LIPOSOME 1.3 % IJ SUSP
INTRAMUSCULAR | Status: DC | PRN
  Administered 2022-04-27: 20 mL

## 2022-04-27 MED ORDER — KETOROLAC TROMETHAMINE 15 MG/ML IJ SOLN
7.5000 mg | Freq: Four times a day (QID) | INTRAMUSCULAR | Status: AC
  Administered 2022-04-27 – 2022-04-28 (×4): 7.5 mg via INTRAVENOUS
  Filled 2022-04-27 (×3): qty 1

## 2022-04-27 SURGICAL SUPPLY — 63 items
BAG COUNTER SPONGE SURGICOUNT (BAG) IMPLANT
BLADE SAG 18X100X1.27 (BLADE) ×1 IMPLANT
BLADE SAW SAG 35X64 .89 (BLADE) ×1 IMPLANT
BLADE SURG 15 STRL LF DISP TIS (BLADE) IMPLANT
BLADE SURG 15 STRL SS (BLADE) ×1
BNDG COHESIVE 3X5 TAN ST LF (GAUZE/BANDAGES/DRESSINGS) ×1 IMPLANT
BNDG ELASTIC 6X10 VLCR STRL LF (GAUZE/BANDAGES/DRESSINGS) ×1 IMPLANT
BOWL SMART MIX CTS (DISPOSABLE) ×1 IMPLANT
CEMENT BONE R 1X40 (Cement) IMPLANT
CEMENT BONE REFOBACIN R1X40 US (Cement) IMPLANT
CHLORAPREP W/TINT 26 (MISCELLANEOUS) ×2 IMPLANT
COMP FEM CEMT PERS SZ7 RT (Joint) ×1 IMPLANT
COMPONENT FEM CEMT PERS SZ7 RT (Joint) IMPLANT
COVER SURGICAL LIGHT HANDLE (MISCELLANEOUS) ×1 IMPLANT
CUFF TOURN SGL QUICK 34 (TOURNIQUET CUFF) ×1
CUFF TRNQT CYL 34X4.125X (TOURNIQUET CUFF) ×1 IMPLANT
DERMABOND ADVANCED .7 DNX12 (GAUZE/BANDAGES/DRESSINGS) ×1 IMPLANT
DRAPE INCISE IOBAN 85X60 (DRAPES) ×1 IMPLANT
DRAPE SHEET LG 3/4 BI-LAMINATE (DRAPES) ×1 IMPLANT
DRAPE U-SHAPE 47X51 STRL (DRAPES) ×1 IMPLANT
DRESSING AQUACEL AG SP 3.5X10 (GAUZE/BANDAGES/DRESSINGS) ×1 IMPLANT
DRSG AQUACEL AG ADV 3.5X10 (GAUZE/BANDAGES/DRESSINGS) IMPLANT
DRSG AQUACEL AG SP 3.5X10 (GAUZE/BANDAGES/DRESSINGS) ×1
ELECT REM PT RETURN 15FT ADLT (MISCELLANEOUS) ×1 IMPLANT
GAUZE SPONGE 4X4 12PLY STRL (GAUZE/BANDAGES/DRESSINGS) ×1 IMPLANT
GLOVE BIO SURGEON STRL SZ 6.5 (GLOVE) ×2 IMPLANT
GLOVE BIOGEL PI IND STRL 6.5 (GLOVE) ×1 IMPLANT
GLOVE BIOGEL PI IND STRL 8 (GLOVE) ×1 IMPLANT
GLOVE SURG ORTHO 8.0 STRL STRW (GLOVE) ×2 IMPLANT
GOWN STRL REUS W/ TWL XL LVL3 (GOWN DISPOSABLE) ×2 IMPLANT
GOWN STRL REUS W/TWL XL LVL3 (GOWN DISPOSABLE) ×2
HANDPIECE INTERPULSE COAX TIP (DISPOSABLE) ×1
HDLS TROCR DRIL PIN KNEE 75 (PIN) ×1
HOLDER FOLEY CATH W/STRAP (MISCELLANEOUS) ×1 IMPLANT
HOOD PEEL AWAY T7 (MISCELLANEOUS) ×3 IMPLANT
INSERT TIB ASF PERS CD6-7 RT (Insert) IMPLANT
MANIFOLD NEPTUNE II (INSTRUMENTS) ×1 IMPLANT
MARKER SKIN DUAL TIP RULER LAB (MISCELLANEOUS) ×1 IMPLANT
NS IRRIG 1000ML POUR BTL (IV SOLUTION) ×1 IMPLANT
PACK TOTAL KNEE CUSTOM (KITS) ×1 IMPLANT
PIN DRILL HDLS TROCAR 75 4PK (PIN) IMPLANT
PROTECTOR NERVE ULNAR (MISCELLANEOUS) ×1 IMPLANT
SCREW HEADED 33MM KNEE (MISCELLANEOUS) IMPLANT
SET HNDPC FAN SPRY TIP SCT (DISPOSABLE) ×1 IMPLANT
SOLUTION IRRIG SURGIPHOR (IV SOLUTION) IMPLANT
SPIKE FLUID TRANSFER (MISCELLANEOUS) ×1 IMPLANT
STEM POLY PAT PLY 32M KNEE (Knees) IMPLANT
STEM TIB ST PERS 14+30 (Stem) IMPLANT
STEM TIBIA 5 DEG SZ D R KNEE (Knees) IMPLANT
STRIP CLOSURE SKIN 1/2X4 (GAUZE/BANDAGES/DRESSINGS) ×1 IMPLANT
SUT MNCRL AB 3-0 PS2 18 (SUTURE) ×1 IMPLANT
SUT STRATAFIX 0 PDS 27 VIOLET (SUTURE) ×1
SUT STRATAFIX PDO 1 14 VIOLET (SUTURE) ×1
SUT STRATFX PDO 1 14 VIOLET (SUTURE) ×1
SUT VIC AB 2-0 CT2 27 (SUTURE) ×2 IMPLANT
SUTURE STRATFX 0 PDS 27 VIOLET (SUTURE) ×1 IMPLANT
SUTURE STRATFX PDO 1 14 VIOLET (SUTURE) ×1 IMPLANT
SYR 50ML LL SCALE MARK (SYRINGE) ×1 IMPLANT
TIBIA STEM 5 DEG SZ D R KNEE (Knees) ×1 IMPLANT
TRAY FOLEY MTR SLVR 14FR STAT (SET/KITS/TRAYS/PACK) IMPLANT
TUBE SUCTION HIGH CAP CLEAR NV (SUCTIONS) ×1 IMPLANT
UNDERPAD 30X36 HEAVY ABSORB (UNDERPADS AND DIAPERS) ×1 IMPLANT
WRAP KNEE MAXI GEL POST OP (GAUZE/BANDAGES/DRESSINGS) IMPLANT

## 2022-04-27 NOTE — Progress Notes (Signed)
Orthopedic Tech Progress Note Patient Details:  Jacqueline Nash 01/16/1941 [id number]  Patient ID: Horald Pollen, female   DOB: [birth year], 82 y.o.   MRN: [id number]  Kennis Carina 04/27/2022, 12:30 PM Right bone foam applied in pacu

## 2022-04-27 NOTE — Transfer of Care (Signed)
Immediate Anesthesia Transfer of Care Note  Patient: Jacqueline Nash  Procedure(s) Performed: TOTAL KNEE ARTHROPLASTY (Right: Knee)  Patient Location: PACU  Anesthesia Type:Spinal  Level of Consciousness: sedated  Airway & Oxygen Therapy: Patient Spontanous Breathing and Patient connected to face mask oxygen  Post-op Assessment: Report given to RN and Post -op Vital signs reviewed and stable  Post vital signs: Reviewed and stable  Last Vitals:  Vitals Value Taken Time  BP 116/71 04/27/22 1225  Temp    Pulse 63 04/27/22 1226  Resp 15 04/27/22 1226  SpO2 99 % 04/27/22 1226  Vitals shown include unvalidated device data.  Last Pain:  Vitals:   04/27/22 0912  TempSrc: Oral  PainSc: 0-No pain         Complications: No notable events documented.

## 2022-04-27 NOTE — Evaluation (Signed)
Physical Therapy Evaluation Patient Details Name: Jacqueline Nash MRN: [id number] DOB: 1940-08-16 Today's Date: 04/27/2022  History of Present Illness  Pt is an 82yo female presenting s/p R-TKA on 04/27/22. 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   Clinical Impression  Jacqueline Nash is a 82 y.o. female POD 0 s/p R-TKA. Patient reports modified independence using RW with mobility at baseline. Patient is now limited by functional impairments (see PT problem list below) and requires min guard for bed mobility and  for transfers, further mobility deferred secondary to pain and pt reporting numbness on plantar surface of bilateral feet. Patient instructed in exercise to facilitate ROM and circulation to manage edema. Provided incentive spirometer and with Vcs pt able to achieve 1551m. Patient will benefit from continued skilled PT interventions to address impairments and progress towards PLOF. Acute PT will follow to progress mobility and stair training in preparation for safe discharge home.       Recommendations for follow up therapy are one component of a multi-disciplinary discharge planning process, led by the attending physician.  Recommendations may be updated based on patient status, additional functional criteria and insurance authorization.  Follow Up Recommendations Follow physician's recommendations for discharge plan and follow up therapies      Assistance Recommended at Discharge Frequent or constant Supervision/Assistance  Patient can return home with the following  A little help with walking and/or transfers;A little help with bathing/dressing/bathroom;Assistance with cooking/housework;Assist for transportation;Help with stairs or ramp for entrance    Equipment Recommendations None recommended by PT  Recommendations for Other Services       Functional Status Assessment Patient has had a recent decline in  their functional status and demonstrates the ability to make significant improvements in function in a reasonable and predictable amount of time.     Precautions / Restrictions Precautions Precautions: Fall;Knee Precaution Booklet Issued: No Precaution Comments: no pillow udner th knee Restrictions Weight Bearing Restrictions: No Other Position/Activity Restrictions: wbat      Mobility  Bed Mobility Overal bed mobility: Needs Assistance Bed Mobility: Supine to Sit, Sit to Supine     Supine to sit: Min guard, HOB elevated Sit to supine: Min guard   General bed mobility comments: For safety only, no physical assist required.    Transfers Overall transfer level: Needs assistance Equipment used: Rolling walker (2 wheels) Transfers: Sit to/from Stand Sit to Stand: Min guard, From elevated surface           General transfer comment: For safety only, no physical assist required. Pt reporting numbness on plantar surface of bilateral feet, able to weightbear but nervous. had pt complete sidesteps EOB to adjust supine positioning, further mobility defererd    Ambulation/Gait               General Gait Details: deferred  Stairs            Wheelchair Mobility    Modified Rankin (Stroke Patients Only)       Balance Overall balance assessment: Needs assistance Sitting-balance support: Feet supported, No upper extremity supported Sitting balance-Leahy Scale: Good     Standing balance support: Reliant on assistive device for balance, During functional activity, Bilateral upper extremity supported Standing balance-Leahy Scale: Poor                               Pertinent Vitals/Pain Pain Assessment Pain Assessment: 0-10 Pain Score:  6  Pain Location: right knee Pain Descriptors / Indicators: Operative site guarding Pain Intervention(s): Limited activity within patient's tolerance, Monitored during session, Repositioned, Ice applied    Home  Living Family/patient expects to be discharged to:: Private residence Living Arrangements: Other relatives Available Help at Discharge: Family;Available 24 hours/day (daughter) Type of Home: House Home Access: Stairs to enter Entrance Stairs-Rails: Right Entrance Stairs-Number of Steps: 2   Home Layout: One level Home Equipment: Conservation officer, nature (2 wheels);Rollator (4 wheels);Cane - single point      Prior Function Prior Level of Function : Independent/Modified Independent;History of Falls (last six months)             Mobility Comments: uses RW, had recent fall while cooking but immediately got up and cont, denied head trauma or seeking medical care. ADLs Comments: IND     Hand Dominance   Dominant Hand: Left    Extremity/Trunk Assessment   Upper Extremity Assessment Upper Extremity Assessment: Overall WFL for tasks assessed    Lower Extremity Assessment Lower Extremity Assessment: RLE deficits/detail;LLE deficits/detail RLE Deficits / Details: MMT ank 4/5, mild extensor lag noted but pt able to perform SLR RLE Sensation: decreased light touch LLE Deficits / Details: MMT ank 4/5 LLE Sensation: decreased light touch    Cervical / Trunk Assessment Cervical / Trunk Assessment: Kyphotic  Communication   Communication: No difficulties  Cognition Arousal/Alertness: Awake/alert Behavior During Therapy: WFL for tasks assessed/performed Overall Cognitive Status: Within Functional Limits for tasks assessed                                          General Comments General comments (skin integrity, edema, etc.): Granddaughter Lyric present    Exercises Total Joint Exercises Ankle Circles/Pumps: AROM, Both, 20 reps, Supine   Assessment/Plan    PT Assessment Patient needs continued PT services  PT Problem List Decreased strength;Decreased range of motion;Decreased activity tolerance;Decreased balance;Decreased mobility;Decreased coordination;Pain        PT Treatment Interventions DME instruction;Gait training;Stair training;Functional mobility training;Therapeutic activities;Therapeutic exercise;Balance training;Neuromuscular re-education;Patient/family education    PT Goals (Current goals can be found in the Care Plan section)  Acute Rehab PT Goals Patient Stated Goal: Return to gardening PT Goal Formulation: With patient Time For Goal Achievement: 05/04/22 Potential to Achieve Goals: Good    Frequency 7X/week     Co-evaluation               AM-PAC PT "6 Clicks" Mobility  Outcome Measure Help needed turning from your back to your side while in a flat bed without using bedrails?: None Help needed moving from lying on your back to sitting on the side of a flat bed without using bedrails?: A Little Help needed moving to and from a bed to a chair (including a wheelchair)?: A Little Help needed standing up from a chair using your arms (e.g., wheelchair or bedside chair)?: A Little Help needed to walk in hospital room?: A Little Help needed climbing 3-5 steps with a railing? : A Little 6 Click Score: 19    End of Session Equipment Utilized During Treatment: Gait belt Activity Tolerance: Patient tolerated treatment well;No increased pain Patient left: in bed;with call bell/phone within reach;with bed alarm set;with family/visitor present Nurse Communication: Mobility status PT Visit Diagnosis: Pain;Difficulty in walking, not elsewhere classified (R26.2) Pain - Right/Left: Right Pain - part of body: Knee  Time: 5465-0354 PT Time Calculation (min) (ACUTE ONLY): 14 min   Charges:   PT Evaluation $PT Eval Low Complexity: 1 Low          Coolidge Breeze, PT, DPT [facility] Rehabilitation Department Office: [phone] Weekend pager: [phone]  Coolidge Breeze 04/27/2022, 6:17 PM

## 2022-04-27 NOTE — Anesthesia Procedure Notes (Signed)
Spinal  Patient location during procedure: OR Start time: 04/27/2022 10:05 AM End time: 04/27/2022 10:11 AM Reason for block: surgical anesthesia Staffing Performed: anesthesiologist  Anesthesiologist: Suzette Battiest, MD Performed by: Suzette Battiest, MD Authorized by: Suzette Battiest, MD   Preanesthetic Checklist Completed: patient identified, IV checked, site marked, risks and benefits discussed, surgical consent, monitors and equipment checked, pre-op evaluation and timeout performed Spinal Block Patient position: sitting Prep: DuraPrep Patient monitoring: heart rate, cardiac monitor, continuous pulse ox and blood pressure Approach: midline Location: L4-5 Injection technique: single-shot Needle Needle type: Pencan  Needle gauge: 24 G Needle length: 9 cm Assessment Sensory level: T4 Events: CSF return and second provider

## 2022-04-27 NOTE — Interval H&P Note (Signed)

## 2022-04-27 NOTE — Op Note (Signed)
DATE OF SURGERY:  04/27/2022 TIME: 12:08 PM  PATIENT NAME:  Jacqueline Nash   AGE: 82 y.o.    PRE-OPERATIVE DIAGNOSIS: End-stage right knee osteoarthritis  POST-OPERATIVE DIAGNOSIS:  Same  PROCEDURE: Right total Knee Arthroplasty  SURGEON:  Geoffery Aultman A Katrina Brosh, MD   ASSISTANT: Izola Price, RNFA, present and scrubbed throughout the case, critical for assistance with exposure, retraction, instrumentation, and closure.   OPERATIVE IMPLANTS:  Zimmer persona size 7 narrow femur, D tibial baseplate, 30 mm stem extension on the tibia, 32 mm patella, 11 mm MC polyliner Implant Name Type Inv. Item Serial No. Manufacturer Lot No. LRB No. Used Action  CEMENT BONE R 1X40 - [id number] Cement CEMENT BONE R 1X40  ZIMMER RECON(ORTH,TRAU,BIO,SG) [id number] Right 2 Implanted  TIBIA STEM 5 DEG SZ D R KNEE - [id number] Knees TIBIA STEM 5 DEG SZ D R KNEE  ZIMMER RECON(ORTH,TRAU,BIO,SG) [id number] Right 1 Implanted  COMP FEM CEMT PERS SZ7 RT - [id number] Joint COMP FEM CEMT PERS SZ7 RT  ZIMMER RECON(ORTH,TRAU,BIO,SG) [id number] Right 1 Implanted  STEM TIB ST PERS 14+30 - [id number] Stem STEM TIB ST PERS 14+30  ZIMMER RECON(ORTH,TRAU,BIO,SG) [id number] Right 1 Implanted  STEM POLY PAT PLY 34M KNEE - [id number] Knees STEM POLY PAT PLY 34M KNEE  ZIMMER RECON(ORTH,TRAU,BIO,SG) [id number] Right 1 Implanted  INSERT TIB ASF PERS CD6-7 RT - [id number] Insert INSERT TIB ASF PERS CD6-7 RT  ZIMMER RECON(ORTH,TRAU,BIO,SG) [id number] Right 1 Implanted      PREOPERATIVE INDICATIONS:  Jacqueline Nash is a 82 y.o. year old female with end stage bone on bone degenerative arthritis of the knee who failed conservative treatment, including injections, antiinflammatories, activity modification, and assistive devices, and had significant impairment of their activities of daily living, and elected for Total Knee Arthroplasty.   The risks, benefits, and alternatives were discussed at length including but not limited to the risks  of infection, bleeding, nerve injury, stiffness, blood clots, the need for revision surgery, cardiopulmonary complications, among others, and they were willing to proceed.  OPERATIVE FINDINGS AND UNIQUE ASPECTS OF THE CASE: Severe preop valgus deformity large posterior lateral defect.  Able to resect 90% of the defect with my tibial cut.  Significant osteopenia, soft bone, elected for tibial stem extension for better fixation.  ESTIMATED BLOOD LOSS: 25cc  OPERATIVE DESCRIPTION:   Once adequate anesthesia was induced, preoperative antibiotics, 2 gm of ancef,1 gm of Tranexamic Acid, and 8 mg of Decadron administered, the patient was positioned supine with a right thigh tourniquet placed.  The right lower extremity was prepped and draped in sterile fashion.  A time-  out was performed identifying the patient, planned procedure, and the appropriate extremity.     The leg was  exsanguinated, tourniquet elevated to 250 mmHg.  A midline incision was  made followed by median parapatellar arthrotomy. Anterior horn of the medial meniscus was released and resected. A medial release was performed, the infrapatellar fat pad was resected with care taken to protect the patellar tendon. The suprapatellar fat was removed to exposed the distal anterior femur. The anterior horn of the lateral meniscus and ACL were released.    Following initial  exposure, I first started with the femur  The femoral  canal was opened with a drill, canal was suctioned to try to prevent fat emboli.  An  intramedullary rod was passed set at 7 degrees valgus, 10 mm given severe preop valgus. The distal femur was resected.  Following this resection, the tibia was  subluxated anteriorly.  Using the extramedullary guide, 40m of bone was resected off   the proximal medial tibial cartilage given severe lateral wear.  Ultimately required additional 4 mm millimeters of resection given the large posterior lateral defect and to fit the spacer block..   We confirmed the gap would be  stable medially and laterally with a size 16mspacer block as well as confirmed that the tibial cut was perpendicular in the coronal plane, checking with an alignment rod.    Once this was done, the posterior femoral referencing femoral sizer was placed under to the posterior condyles with 3 degrees of external rotational which was parallel to the transepicondylar axis and perpendicular to WhEastman ChemicalThe femur was sized to be a size 7 in the anterior-  posterior dimension. The  anterior, posterior, and  chamfer cuts were made without difficulty nor   notching making certain that I was along the anterior cortex to help  with flexion gap stability. Next a laminar spreader was placed with the knee in flexion and the medial lateral menisci were resected.  5 cc of the Exparel mixture was injected in the medial side of the back of the knee and 3 cc in the lateral side.  1/2 inch curved osteotome was used to resect posterior osteophyte that was then removed with a pituitary rongeur.       At this point, the tibia was sized to be a size D.  The size D tray was  then pinned in position. Trial reduction was now carried with a 7 femur, D tibia, a 10 mm MC insert.  The knee was notably tight laterally given the preop valgus.  Used a 15 blade to piecrust the IT band laterally.  Increased the insert to 11 mm.  This had better varus valgus stability in extension.  The knee had full extension and was stable to varus valgus stress in extension.  The knee was slightly tight in flexion and the PCL was partially released.   Attention was next directed to the patella.  Precut  measurement was noted to be 21 mm.  I resected down to 13 mm and used a  3250matellar button to restore patellar height as well as cover the cut surface.     The patella lug holes were drilled and a 49m37mtella poly trial was placed.    The knee was brought to full extension with good flexion stability with the  patella tracking through the trochlea without application of pressure.     Next the femoral component was again assessed and determined to be seated and appropriately lateralized.  The femoral lug holes were drilled.  The femoral component was then removed. Tibial component was again assessed and felt to be seated and appropriately rotated with the medial third of the tubercle. The tibia was then drilled, and keel punched.     Final components were  opened and cement was mixed.      Final implants were then  cemented onto cleaned and dried cut surfaces of bone with the knee brought to extension with a 11 mm MC poly.  The knee was irrigated with sterile Betadine diluted in saline as well as pulse lavage normal saline.  The synovial lining was  then injected a dilute Exparel.      Once the cement had fully cured, excess cement was removed throughout the knee.  I confirmed that I was satisfied with the range of motion and stability, and the final 11mm62mpoly  insert was chosen.  It was placed into the knee.         The tourniquet had been let down.  No significant hemostasis was required.  The medial parapatellar arthrotomy was then reapproximated using #1 Stratafix sutures with the knee  in flexion.  The remaining wound was closed with 0 stratafix, 2-0 Vicryl, and running 3-0 Monocryl. The knee was cleaned, dried, dressed sterilely using Dermabond and   Aquacel dressing.  The patient was then brought to recovery room in stable condition, tolerating the procedure  well. There were no complications.   Post op recs: WB: WBAT Abx: ancef Imaging: PACU xrays DVT prophylaxis: Aspirin '81mg'$  BID x4 weeks Follow up: 2 weeks after surgery for a wound check with Dr. Zachery Dakins at [facility].  Address: [address], [place], [place] [address]  Office Phone: [phone]  Charlies Constable, MD Orthopaedic Surgery

## 2022-04-27 NOTE — Anesthesia Procedure Notes (Signed)
Anesthesia Regional Block: Adductor canal block   Pre-Anesthetic Checklist: , timeout performed,  Correct Patient, Correct Site, Correct Laterality,  Correct Procedure, Correct Position, site marked,  Risks and benefits discussed,  Surgical consent,  Pre-op evaluation,  At surgeon's request and post-op pain management  Laterality: Right  Prep: chloraprep       Needles:  Injection technique: Single-shot  Needle Type: Echogenic Needle     Needle Length: 9cm  Needle Gauge: 21     Additional Needles:   Procedures:,,,, ultrasound used (permanent image in chart),,    Narrative:  Start time: 04/27/2022 9:10 AM End time: 04/27/2022 9:15 AM Injection made incrementally with aspirations every 5 mL.  Performed by: Personally  Anesthesiologist: Suzette Battiest, MD

## 2022-04-27 NOTE — Discharge Instructions (Signed)

## 2022-04-27 NOTE — Anesthesia Preprocedure Evaluation (Signed)
Anesthesia Evaluation  Patient identified by MRN, date of birth, ID band Patient awake    Reviewed: Allergy & Precautions, NPO status , Patient's Chart, lab work & pertinent test results  History of Anesthesia Complications (+) PONV and history of anesthetic complications  Airway Mallampati: II  TM Distance: >3 FB Neck ROM: Full    Dental  (+) Dental Advisory Given   Pulmonary former smoker   breath sounds clear to auscultation       Cardiovascular hypertension, Pt. on medications  Rhythm:Regular Rate:Normal     Neuro/Psych  Headaches  Neuromuscular disease    GI/Hepatic negative GI ROS, Neg liver ROS,,,  Endo/Other  Hypothyroidism    Renal/GU negative Renal ROS     Musculoskeletal  (+) Arthritis ,    Abdominal   Peds  Hematology negative hematology ROS (+)   Anesthesia Other Findings   Reproductive/Obstetrics                             Anesthesia Physical Anesthesia Plan  ASA: 2  Anesthesia Plan: Spinal   Post-op Pain Management: Regional block* and Tylenol PO (pre-op)*   Induction:   PONV Risk Score and Plan: 3 and Propofol infusion, Dexamethasone, Ondansetron and Treatment may vary due to age or medical condition  Airway Management Planned: Natural Airway and Simple Face Mask  Additional Equipment:   Intra-op Plan:   Post-operative Plan:   Informed Consent: I have reviewed the patients History and Physical, chart, labs and discussed the procedure including the risks, benefits and alternatives for the proposed anesthesia with the patient or authorized representative who has indicated his/her understanding and acceptance.       Plan Discussed with: CRNA  Anesthesia Plan Comments:        Anesthesia Quick Evaluation

## 2022-04-28 ENCOUNTER — Encounter (HOSPITAL_COMMUNITY): Payer: Self-pay | Admitting: Orthopedic Surgery

## 2022-04-28 DIAGNOSIS — M1711 Unilateral primary osteoarthritis, right knee: Secondary | ICD-10-CM | POA: Diagnosis not present

## 2022-04-28 LAB — BASIC METABOLIC PANEL
Anion gap: 5 (ref 5–15)
BUN: 28 mg/dL — ABNORMAL HIGH (ref 8–23)
CO2: 24 mmol/L (ref 22–32)
Calcium: 9.5 mg/dL (ref 8.9–10.3)
Chloride: 109 mmol/L (ref 98–111)
Creatinine, Ser: 1.05 mg/dL — ABNORMAL HIGH (ref 0.44–1.00)
GFR, Estimated: 53 mL/min — ABNORMAL LOW (ref >60)
Glucose, Bld: 130 mg/dL — ABNORMAL HIGH (ref 70–99)
Potassium: 4.3 mmol/L (ref 3.5–5.1)
Sodium: 138 mmol/L (ref 135–145)

## 2022-04-28 LAB — CBC
HCT: 31.7 % — ABNORMAL LOW (ref 36.0–46.0)
Hemoglobin: 10.4 g/dL — ABNORMAL LOW (ref 12.0–15.0)
MCH: 31.8 pg (ref 26.0–34.0)
MCHC: 32.8 g/dL (ref 30.0–36.0)
MCV: 96.9 fL (ref 80.0–100.0)
Platelets: 209 10*3/uL (ref 150–400)
RBC: 3.27 MIL/uL — ABNORMAL LOW (ref 3.87–5.11)
RDW: 13.7 % (ref 11.5–15.5)
WBC: 9.3 10*3/uL (ref 4.0–10.5)
nRBC: 0 % (ref 0.0–0.2)

## 2022-04-28 MED ORDER — ACETAMINOPHEN 500 MG PO TABS: 1000.0000 mg | ORAL_TABLET | Freq: Three times a day (TID) | ORAL | 0 refills | Status: AC | PRN

## 2022-04-28 MED ORDER — ONDANSETRON HCL 4 MG PO TABS: 4.0000 mg | ORAL_TABLET | Freq: Three times a day (TID) | ORAL | 0 refills | Status: AC | PRN

## 2022-04-28 MED ORDER — OXYCODONE HCL 5 MG PO TABS: 5.0000 mg | ORAL_TABLET | ORAL | 0 refills | Status: AC | PRN

## 2022-04-28 MED ORDER — METHOCARBAMOL 500 MG PO TABS: 500.0000 mg | ORAL_TABLET | Freq: Three times a day (TID) | ORAL | 0 refills | Status: AC | PRN

## 2022-04-28 MED ORDER — ASPIRIN 81 MG PO TBEC: 81.0000 mg | DELAYED_RELEASE_TABLET | Freq: Two times a day (BID) | ORAL | 0 refills | Status: AC

## 2022-04-28 NOTE — Progress Notes (Signed)
Physical Therapy Treatment Patient Details Name: Jacqueline Nash MRN: [id number] DOB: Dec 22, 1940 Today's Date: 04/28/2022   History of Present Illness Pt is an 82yo female presenting s/p R-TKA on 04/27/22. 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  General bed mobility comments: demonstarted and instructed how to use a belt to self asisst LE off bed.  General transfer comment: 25% VC's on proper hand placement and safety with turns using walker.  Also asissted to bathroom. General Gait Details: 25% VC's on proper sequencing as well as proper walker to self distance and safety with turns. Then returned to room to perform some TE's following HEP handout.  Instructed on proper tech, freq as well as use of ICE.   Pt will need another PT session to practice stairs and complete HEP Education.    Recommendations for follow up therapy are one component of a multi-disciplinary discharge planning process, led by the attending physician.  Recommendations may be updated based on patient status, additional functional criteria and insurance authorization.  Follow Up Recommendations  Follow physician's recommendations for discharge plan and follow up therapies     Assistance Recommended at Discharge Frequent or constant Supervision/Assistance  Patient can return home with the following A little help with walking and/or transfers;A little help with bathing/dressing/bathroom;Assistance with cooking/housework;Assist for transportation;Help with stairs or ramp for entrance   Equipment Recommendations  None recommended by PT    Recommendations for Other Services       Precautions / Restrictions Precautions Precautions: Fall;Knee Precaution Comments: no pillow udner th knee Restrictions Weight Bearing Restrictions: No Other  Position/Activity Restrictions: WBAT     Mobility  Bed Mobility Overal bed mobility: Needs Assistance Bed Mobility: Supine to Sit     Supine to sit: Min guard, HOB elevated     General bed mobility comments: demonstarted and instructed how to use a belt to self asisst LE off bed    Transfers Overall transfer level: Needs assistance Equipment used: Rolling walker (2 wheels) Transfers: Sit to/from Stand Sit to Stand: Min guard, From elevated surface           General transfer comment: 25% VC's on proper hand placement and safety with turns using walker.  Also asissted to bathroom.    Ambulation/Gait Ambulation/Gait assistance: Min guard, Supervision Gait Distance (Feet): 22 Feet Assistive device: Rolling walker (2 wheels) Gait Pattern/deviations: Step-to pattern Gait velocity: decreased     General Gait Details: 25% VC's on proper sequencing as well as proper walker to self distance and safety with turns.   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        Exercises  Total Knee Replacement TE's following HEP handout 10 reps B LE ankle pumps 05 reps towel squeezes 05 reps knee presses 05 reps heel slides  05 reps SAQ's 05 reps SLR's 05 reps ABD Educated on use of gait belt to assist with TE's Followed by ICE     General Comments        Pertinent Vitals/Pain Pain Assessment Pain Assessment: 0-10 Pain Score: 5  Pain Location: right knee Pain Descriptors / Indicators: Operative site guarding, Grimacing, Tender, Sore Pain Intervention(s): Monitored  during session, Premedicated before session, Repositioned, Ice applied    Home Living                          Prior Function            PT Goals (current goals can now be found in the care plan section) Progress towards PT goals: Progressing toward goals    Frequency    7X/week      PT Plan Current plan remains appropriate    Co-evaluation              AM-PAC PT "6 Clicks" Mobility   Outcome Measure  Help needed turning from your back to your side while in a flat bed without using bedrails?: A Little Help needed moving from lying on your back to sitting on the side of a flat bed without using bedrails?: A Little Help needed moving to and from a bed to a chair (including a wheelchair)?: A Little Help needed standing up from a chair using your arms (e.g., wheelchair or bedside chair)?: A Little Help needed to walk in hospital room?: A Little Help needed climbing 3-5 steps with a railing? : A Little 6 Click Score: 18    End of Session Equipment Utilized During Treatment: Gait belt Activity Tolerance: Patient tolerated treatment well Patient left: in chair;with call bell/phone within reach;with chair alarm set Nurse Communication: Mobility status PT Visit Diagnosis: Pain;Difficulty in walking, not elsewhere classified (R26.2) Pain - Right/Left: Right Pain - part of body: Knee     Time: 0900-0940 PT Time Calculation (min) (ACUTE ONLY): 40 min  Charges:  $Gait Training: 8-22 mins $Therapeutic Exercise: 8-22 mins $Therapeutic Activity: 8-22 mins                     Rica Koyanagi  PTA [employer] [facility] M-F          [phone] Weekend pager [phone]

## 2022-04-28 NOTE — Progress Notes (Signed)
     Subjective:  Patient reports pain as mild this morning.  No pain in the knee a little pain down the shin.  Worked with physical therapy yesterday afternoon.  She feels the session went well.  She was able to stand and do a couple steps near the bed.  Eager to progress with therapy today and hopeful to go home.  Objective:   VITALS:   Vitals:   04/27/22 1914 04/27/22 2145 04/28/22 0155 04/28/22 0554  BP: (!) 140/91 113/66 121/68 126/75  Pulse: 70 60 60 66  Resp: '14 16 16 18  '$ Temp: 97.9 F (36.6 C) 97.7 F (36.5 C) (!) 97.4 F (36.3 C) 97.7 F (36.5 C)  TempSrc: Oral Oral Oral Oral  SpO2: 95% 94% 97% 95%  Weight:      Height:        Sensation intact distally Intact pulses distally Dorsiflexion/Plantar flexion intact Incision: dressing C/D/I Compartment soft   Lab Results  Component Value Date   WBC 9.3 04/28/2022   HGB 10.4 (L) 04/28/2022   HCT 31.7 (L) 04/28/2022   MCV 96.9 04/28/2022   PLT 209 04/28/2022   BMET    Component Value Date/Time   NA 138 04/28/2022 0436   K 4.3 04/28/2022 0436   CL 109 04/28/2022 0436   CO2 24 04/28/2022 0436   GLUCOSE 130 (H) 04/28/2022 0436   BUN 28 (H) 04/28/2022 0436   CREATININE 1.05 (H) 04/28/2022 0436   CALCIUM 9.5 04/28/2022 0436   GFRNONAA 53 (L) 04/28/2022 0436   Xray: xrays show TKA components good position no advese features  Assessment/Plan: 1 Day Post-Op   Principal Problem:   Localized osteoarthritis of right knee  S/p R TKA 1/15    Post op recs: WB: WBAT Abx: ancef Imaging: PACU xrays DVT prophylaxis: Aspirin '81mg'$  BID x4 weeks Follow up: 2 weeks after surgery for a wound check with Dr. Zachery Dakins at [facility].  Address: [address] [place], [place], [place] [address]  Office Phone: [phone]   Charlies Constable, MD Orthopaedic Surgery   Mckenzy Salazar A [place] 04/28/2022, 6:58 AM   Charlies Constable, MD  Contact information:   [phone] 7am-5pm epic message Dr.  Zachery Dakins, or call office for patient follow up: [phone] After hours and holidays please check [email or web address] for group call information for [facility]

## 2022-04-28 NOTE — Progress Notes (Signed)
Physical Therapy Treatment Patient Details Name: Shaquala Broeker MRN: [id number] DOB: Oct 17, 1940 Today's Date: 04/28/2022   History of Present Illness Pt is an 82yo female presenting s/p R-TKA on 04/27/22. 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    PT Comments    POD # 1 pm session Assisted with amb a greater distance in hallway.  Practiced stairs.  Completed HEP Education.  Addressed all mobility questions, discussed appropriate activity, educated on use of ICE.  Pt ready for D/C to home.   Recommendations for follow up therapy are one component of a multi-disciplinary discharge planning process, led by the attending physician.  Recommendations may be updated based on patient status, additional functional criteria and insurance authorization.  Follow Up Recommendations  Follow physician's recommendations for discharge plan and follow up therapies     Assistance Recommended at Discharge Frequent or constant Supervision/Assistance  Patient can return home with the following A little help with walking and/or transfers;A little help with bathing/dressing/bathroom;Assistance with cooking/housework;Assist for transportation;Help with stairs or ramp for entrance   Equipment Recommendations  None recommended by PT    Recommendations for Other Services       Precautions / Restrictions Precautions Precautions: Fall;Knee Precaution Comments: no pillow udner th knee Restrictions Weight Bearing Restrictions: No Other Position/Activity Restrictions: WBAT     Mobility  Bed Mobility Overal bed mobility: Needs Assistance Bed Mobility: Supine to Sit     Supine to sit: Min guard, HOB elevated     General bed mobility comments: Pt OOB in recliner    Transfers Overall transfer level: Needs assistance Equipment used: Rolling walker (2 wheels) Transfers: Sit to/from Stand Sit to Stand: Min guard, From elevated surface            General transfer comment: 25% VC's on proper hand placement and safety with turns using walker.  Also asissted to bathroom.    Ambulation/Gait Ambulation/Gait assistance: Min guard, Supervision Gait Distance (Feet): 35 Feet Assistive device: Rolling walker (2 wheels) Gait Pattern/deviations: Step-to pattern Gait velocity: decreased     General Gait Details: 25% VC's on proper sequencing as well as proper walker to self distance and safety with turns.   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        Exercises  05 reps all seated TE's following HEP handout    General Comments        Pertinent Vitals/Pain Pain Assessment Pain Assessment: 0-10 Pain Score: 5  Pain Location: right knee Pain Descriptors / Indicators: Operative site guarding, Grimacing, Tender, Sore Pain Intervention(s): Monitored during session, Premedicated before session, Repositioned, Ice applied  Home Living                          Prior Function            PT Goals (current goals can now be found in the care plan section) Progress towards PT goals: Progressing toward goals    Frequency    7X/week      PT Plan Current plan remains appropriate    Co-evaluation              AM-PAC PT "6 Clicks" Mobility   Outcome Measure  Help needed turning from your back to your side while in a flat  bed without using bedrails?: A Little Help needed moving from lying on your back to sitting on the side of a flat bed without using bedrails?: A Little Help needed moving to and from a bed to a chair (including a wheelchair)?: A Little Help needed standing up from a chair using your arms (e.g., wheelchair or bedside chair)?: A Little Help needed to walk in hospital room?: A Little Help needed climbing 3-5 steps with a railing? : A Little 6 Click Score: 18    End of Session Equipment Utilized During Treatment: Gait belt Activity Tolerance: Patient tolerated treatment well Patient left: in chair;with call bell/phone within reach;with chair alarm set Nurse Communication: Mobility status PT Visit Diagnosis: Pain;Difficulty in walking, not elsewhere classified (R26.2) Pain - Right/Left: Right Pain - part of body: Knee     Time: 1333-1405 PT Time Calculation (min) (ACUTE ONLY): 32 min  Charges:  $Gait Training: 8-22 mins $Therapeutic Exercise: 8-22 mins                     Rica Koyanagi  PTA [place] Office M-F          [phone] Weekend pager [phone]

## 2022-04-28 NOTE — TOC Progression Note (Signed)
Transition of Care [facility]) - Progression Note    Patient Details  Name: Jacqueline Nash MRN: [id number] Date of Birth: May 11, 1940  [facility] CM/SW Contact  Purcell Mouton, RN Phone Number: 04/28/2022, 8:21 AM  Clinical Narrative:     Pt discharged home to follow MD instructions.   Expected Discharge Plan: Home/Self Care Barriers to Discharge: No Barriers Identified  Expected Discharge Plan and Services       Living arrangements for the past 2 months: Single Family Home Expected Discharge Date: 04/28/22                                     Social Determinants of Health (SDOH) Interventions SDOH Screenings   Food Insecurity: No Food Insecurity (04/27/2022)  Housing: Low Risk  (04/27/2022)  Transportation Needs: No Transportation Needs (04/27/2022)  Utilities: Not At Risk (04/27/2022)  Tobacco Use: Medium Risk (04/27/2022)    Readmission Risk Interventions     No data to display

## 2022-04-28 NOTE — Anesthesia Postprocedure Evaluation (Signed)
Anesthesia Post Note  Patient: Jacqueline Nash  Procedure(s) Performed: TOTAL KNEE ARTHROPLASTY (Right: Knee)     Patient location during evaluation: PACU Anesthesia Type: Spinal Level of consciousness: awake and alert Pain management: pain level controlled Vital Signs Assessment: post-procedure vital signs reviewed and stable Respiratory status: spontaneous breathing and respiratory function stable Cardiovascular status: blood pressure returned to baseline and stable Postop Assessment: spinal receding Anesthetic complications: no   No notable events documented.  Last Vitals:  Vitals:   04/28/22 0554 04/28/22 0947  BP: 126/75 126/68  Pulse: 66 76  Resp: 18 16  Temp: 36.5 C 36.4 C  SpO2: 95% 98%    Last Pain:  Vitals:   04/28/22 0947  TempSrc: Oral  PainSc:                  [name]

## 2022-04-28 NOTE — Progress Notes (Signed)
Pt was discharged home today. Instructions were reviewed with patient, and questions were answered. Pt was taken to main entrance via wheelchair by NT.

## 2022-04-28 NOTE — Plan of Care (Signed)
Problem: Education: Goal: Knowledge of the prescribed therapeutic regimen will improve 04/28/2022 1432 by Charlyne Petrin, RN Outcome: Adequate for Discharge 04/28/2022 1429 by Charlyne Petrin, RN Outcome: Adequate for Discharge Goal: Individualized Educational Video(s) 04/28/2022 1432 by Charlyne Petrin, RN Outcome: Adequate for Discharge 04/28/2022 1429 by Charlyne Petrin, RN Outcome: Adequate for Discharge   Problem: Activity: Goal: Ability to avoid complications of mobility impairment will improve 04/28/2022 1432 by Charlyne Petrin, RN Outcome: Adequate for Discharge 04/28/2022 1429 by Charlyne Petrin, RN Outcome: Adequate for Discharge Goal: Range of joint motion will improve 04/28/2022 1432 by Charlyne Petrin, RN Outcome: Adequate for Discharge 04/28/2022 1429 by Charlyne Petrin, RN Outcome: Adequate for Discharge   Problem: Clinical Measurements: Goal: Postoperative complications will be avoided or minimized 04/28/2022 1432 by Charlyne Petrin, RN Outcome: Adequate for Discharge 04/28/2022 1429 by Charlyne Petrin, RN Outcome: Adequate for Discharge   Problem: Pain Management: Goal: Pain level will decrease with appropriate interventions 04/28/2022 1432 by Charlyne Petrin, RN Outcome: Adequate for Discharge 04/28/2022 1429 by Charlyne Petrin, RN Outcome: Adequate for Discharge   Problem: Skin Integrity: Goal: Will show signs of wound healing 04/28/2022 1432 by Charlyne Petrin, RN Outcome: Adequate for Discharge 04/28/2022 1429 by Charlyne Petrin, RN Outcome: Adequate for Discharge   Problem: Education: Goal: Knowledge of General Education information will improve Description: Including pain rating scale, medication(s)/side effects and non-pharmacologic comfort measures 04/28/2022 1432 by Charlyne Petrin, RN Outcome: Adequate for Discharge 04/28/2022 1429 by Charlyne Petrin, RN Outcome: Adequate for Discharge   Problem: Health Behavior/Discharge Planning: Goal: Ability to manage health-related needs will  improve 04/28/2022 1432 by Charlyne Petrin, RN Outcome: Adequate for Discharge 04/28/2022 1429 by Charlyne Petrin, RN Outcome: Adequate for Discharge   Problem: Clinical Measurements: Goal: Ability to maintain clinical measurements within normal limits will improve 04/28/2022 1432 by Charlyne Petrin, RN Outcome: Adequate for Discharge 04/28/2022 1429 by Charlyne Petrin, RN Outcome: Adequate for Discharge Goal: Will remain free from infection 04/28/2022 1432 by Charlyne Petrin, RN Outcome: Adequate for Discharge 04/28/2022 1429 by Charlyne Petrin, RN Outcome: Adequate for Discharge Goal: Diagnostic test results will improve 04/28/2022 1432 by Charlyne Petrin, RN Outcome: Adequate for Discharge 04/28/2022 1429 by Charlyne Petrin, RN Outcome: Adequate for Discharge Goal: Respiratory complications will improve 04/28/2022 1432 by Charlyne Petrin, RN Outcome: Adequate for Discharge 04/28/2022 1429 by Charlyne Petrin, RN Outcome: Adequate for Discharge Goal: Cardiovascular complication will be avoided 04/28/2022 1432 by Charlyne Petrin, RN Outcome: Adequate for Discharge 04/28/2022 1429 by Charlyne Petrin, RN Outcome: Adequate for Discharge   Problem: Activity: Goal: Risk for activity intolerance will decrease 04/28/2022 1432 by Charlyne Petrin, RN Outcome: Adequate for Discharge 04/28/2022 1429 by Charlyne Petrin, RN Outcome: Adequate for Discharge   Problem: Nutrition: Goal: Adequate nutrition will be maintained 04/28/2022 1432 by Charlyne Petrin, RN Outcome: Adequate for Discharge 04/28/2022 1429 by Charlyne Petrin, RN Outcome: Adequate for Discharge   Problem: Coping: Goal: Level of anxiety will decrease 04/28/2022 1432 by Charlyne Petrin, RN Outcome: Adequate for Discharge 04/28/2022 1429 by Charlyne Petrin, RN Outcome: Adequate for Discharge   Problem: Elimination: Goal: Will not experience complications related to bowel motility 04/28/2022 1432 by Charlyne Petrin, RN Outcome: Adequate for Discharge 04/28/2022 1429 by Charlyne Petrin,  RN Outcome: Adequate for Discharge Goal: Will not experience complications related to urinary retention 04/28/2022 1432 by Charlyne Petrin, RN Outcome: Adequate for Discharge 04/28/2022 1429 by Charlyne Petrin, RN Outcome: Adequate for Discharge   Problem: Pain Managment: Goal: General experience of comfort will improve 04/28/2022 1432 by  Charlyne Petrin, RN Outcome: Adequate for Discharge 04/28/2022 1429 by Charlyne Petrin, RN Outcome: Adequate for Discharge   Problem: Safety: Goal: Ability to remain free from injury will improve 04/28/2022 1432 by Charlyne Petrin, RN Outcome: Adequate for Discharge 04/28/2022 1429 by Charlyne Petrin, RN Outcome: Adequate for Discharge   Problem: Skin Integrity: Goal: Risk for impaired skin integrity will decrease 04/28/2022 1432 by Charlyne Petrin, RN Outcome: Adequate for Discharge 04/28/2022 1429 by Charlyne Petrin, RN Outcome: Adequate for Discharge

## 2022-04-28 NOTE — Plan of Care (Signed)

## 2022-04-29 NOTE — Discharge Summary (Signed)
Physician Discharge Summary  Patient ID: Jacqueline Nash MRN: [id number] DOB/AGE: 10-21-1940 82 y.o.  Admit date: 04/27/2022 Discharge date:  04/28/22  Admission Diagnoses:  Localized osteoarthritis of right knee  Discharge Diagnoses:  Principal Problem:   Localized osteoarthritis of right knee   Past Medical History:  Diagnosis Date   Arthritis    Cancer ([place])    Depression    Hyperlipidemia    Hypertension    no htn meds in years per pt   Hypothyroid    Insomnia    Migraine    Osteoporosis    Peripheral neuropathy    PONV (postoperative nausea and vomiting)    Scoliosis    Thyroid cancer ([place]) 1970    Surgeries: Procedure(s): TOTAL KNEE ARTHROPLASTY on 04/27/2022   Consultants (if any):   Discharged Condition: Improved  Hospital Course: Jacqueline Nash is an 82 y.o. female who was admitted 04/27/2022 with a diagnosis of Localized osteoarthritis of right knee and went to the operating room on 04/27/2022 and underwent the above named procedures.    She was given perioperative antibiotics:  Anti-infectives (From admission, onward)    Start     Dose/Rate Route Frequency Ordered Stop   04/27/22 1800  ceFAZolin (ANCEF) IVPB 2g/100 mL premix        2 g 200 mL/hr over 30 Minutes Intravenous Every 6 hours 04/27/22 1707 04/28/22 0014   04/27/22 0815  ceFAZolin (ANCEF) IVPB 2g/100 mL premix        2 g 200 mL/hr over 30 Minutes Intravenous On call to O.R. 04/27/22 5188 04/27/22 1012     .  She was given sequential compression devices, early ambulation, and aspirin for DVT prophylaxis.  She benefited maximally from the hospital stay and there were no complications.    Recent vital signs:  Vitals:   04/28/22 0947 04/28/22 1426  BP: 126/68 110/67  Pulse: 76 70  Resp: 16 16  Temp: 97.6 F (36.4 C) 98.2 F (36.8 C)  SpO2: 98% 96%    Recent laboratory studies:  Lab Results  Component Value Date   HGB 10.4 (L) 04/28/2022   HGB 13.3 04/15/2022   HGB 13.8  02/17/2022   Lab Results  Component Value Date   WBC 9.3 04/28/2022   PLT 209 04/28/2022   No results found for: "INR" Lab Results  Component Value Date   NA 138 04/28/2022   K 4.3 04/28/2022   CL 109 04/28/2022   CO2 24 04/28/2022   BUN 28 (H) 04/28/2022   CREATININE 1.05 (H) 04/28/2022   GLUCOSE 130 (H) 04/28/2022    Discharge Medications:   Allergies as of 04/28/2022       Reactions   Sulfa Antibiotics Hives   Demerol [meperidine] Hives        Medication List     TAKE these medications    acetaminophen 500 MG tablet Commonly known as: TYLENOL Take 2 tablets (1,000 mg total) by mouth every 8 (eight) hours as needed.   aspirin EC 81 MG tablet Take 1 tablet (81 mg total) by mouth 2 (two) times daily for 28 days. Swallow whole.   b complex vitamins capsule Take 1 capsule by mouth daily.   cholecalciferol 25 MCG (1000 UNIT) tablet Commonly known as: VITAMIN D3 Take 1,000 Units by mouth daily.   dicyclomine 10 MG capsule Commonly known as: BENTYL Take 10 mg by mouth in the morning, at noon, and at bedtime.   EPINEPHrine 0.3 mg/0.3 mL Soaj injection Commonly  known as: EPI-PEN Inject 0.3 mg into the muscle as needed for anaphylaxis. As needed for life-threatening allergic reactions   famotidine 10 MG tablet Commonly known as: PEPCID Take 10 mg by mouth daily.   levothyroxine 75 MCG tablet Commonly known as: SYNTHROID Take 75 mcg by mouth daily before breakfast.   meloxicam 15 MG tablet Commonly known as: MOBIC Take 7.5 mg by mouth daily.   methocarbamol 500 MG tablet Commonly known as: ROBAXIN Take 1 tablet (500 mg total) by mouth every 8 (eight) hours as needed for up to 10 days for muscle spasms.   montelukast 10 MG tablet Commonly known as: SINGULAIR Take 10 mg by mouth at bedtime.   ondansetron 4 MG tablet Commonly known as: Zofran Take 1 tablet (4 mg total) by mouth every 8 (eight) hours as needed for up to 14 days for nausea or vomiting.    oxyCODONE 5 MG immediate release tablet Commonly known as: Roxicodone Take 1 tablet (5 mg total) by mouth every 4 (four) hours as needed for up to 7 days for severe pain or moderate pain.   polyethylene glycol 17 g packet Commonly known as: MIRALAX / GLYCOLAX Take 17 g by mouth daily as needed for moderate constipation.   pregabalin 150 MG capsule Commonly known as: LYRICA Take 150 mg by mouth 2 (two) times daily.   rosuvastatin 40 MG tablet Commonly known as: CRESTOR Take 40 mg by mouth at bedtime.   sertraline 100 MG tablet Commonly known as: ZOLOFT Take 100 mg by mouth daily.   SUMAtriptan 50 MG tablet Commonly known as: IMITREX Take 50 mg by mouth every 2 (two) hours as needed for migraine or headache. May repeat in 2 hours if headache persists or recurs.   TRIAMCINOLONE ACETONIDE EX Apply 1 application topically daily as needed (eczema).   vitamin C 1000 MG tablet Take 1,000 mg by mouth daily.   VITAMIN E PO Take 1 tablet by mouth daily.        Diagnostic Studies: DG Knee Right Port  Result Date: 04/27/2022 CLINICAL DATA:  Postop right knee. EXAM: PORTABLE RIGHT KNEE - 1-2 VIEW COMPARISON:  None Available. FINDINGS: Right knee replacement is identified without malalignment. Postoperative changes including joint fluid and air are identified. IMPRESSION: Right knee replacement without malalignment. Electronically Signed   By: Abelardo Diesel M.D.   On: 04/27/2022 13:04    Disposition: Discharge disposition: 01-Home or Self Care       Discharge Instructions     Call MD / Call 911   Complete by: As directed    If you experience chest pain or shortness of breath, CALL 911 and be transported to the hospital emergency room.  If you develope a fever above 101 F, pus (white drainage) or increased drainage or redness at the wound, or calf pain, call your surgeon's office.   Constipation Prevention   Complete by: As directed    Drink plenty of fluids.  Prune juice may  be helpful.  You may use a stool softener, such as Colace (over the counter) 100 mg twice a day.  Use MiraLax (over the counter) for constipation as needed.   Diet - low sodium heart healthy   Complete by: As directed    Do not put a pillow under the knee. Place it under the heel.   Complete by: As directed    Increase activity slowly as tolerated   Complete by: As directed    Post-operative opioid taper instructions:  Complete by: As directed    POST-OPERATIVE OPIOID TAPER INSTRUCTIONS: It is important to wean off of your opioid medication as soon as possible. If you do not need pain medication after your surgery it is ok to stop day one. Opioids include: Codeine, Hydrocodone(Norco, Vicodin), Oxycodone(Percocet, oxycontin) and hydromorphone amongst others.  Long term and even short term use of opiods can cause: Increased pain response Dependence Constipation Depression Respiratory depression And more.  Withdrawal symptoms can include Flu like symptoms Nausea, vomiting And more Techniques to manage these symptoms Hydrate well Eat regular healthy meals Stay active Use relaxation techniques(deep breathing, meditating, yoga) Do Not substitute Alcohol to help with tapering If you have been on opioids for less than two weeks and do not have pain than it is ok to stop all together.  Plan to wean off of opioids This plan should start within one week post op of your joint replacement. Maintain the same interval or time between taking each dose and first decrease the dose.  Cut the total daily intake of opioids by one tablet each day Next start to increase the time between doses. The last dose that should be eliminated is the evening dose.               Discharge Instructions      INSTRUCTIONS AFTER JOINT REPLACEMENT   Remove items at home which could result in a fall. This includes throw rugs or furniture in walking pathways ICE to the affected joint every three hours  while awake for 30 minutes at a time, for at least the first 3-5 days, and then as needed for pain and swelling.  Continue to use ice for pain and swelling. You may notice swelling that will progress down to the foot and ankle.  This is normal after surgery.  Elevate your leg when you are not up walking on it.   Continue to use the breathing machine you got in the hospital (incentive spirometer) which will help keep your temperature down.  It is common for your temperature to cycle up and down following surgery, especially at night when you are not up moving around and exerting yourself.  The breathing machine keeps your lungs expanded and your temperature down.   DIET:  As you were doing prior to hospitalization, we recommend a well-balanced diet.  DRESSING / WOUND CARE / SHOWERING  Keep the surgical dressing until follow up.  The dressing is water proof, so you can shower without any extra covering.  IF THE DRESSING FALLS OFF or the wound gets wet inside, change the dressing with sterile gauze.  Please use good hand washing techniques before changing the dressing.  Do not use any lotions or creams on the incision until instructed by your surgeon.    ACTIVITY  Increase activity slowly as tolerated, but follow the weight bearing instructions below.   No driving for 6 weeks or until further direction given by your physician.  You cannot drive while taking narcotics.  No lifting or carrying greater than 10 lbs. until further directed by your surgeon. Avoid periods of inactivity such as sitting longer than an hour when not asleep. This helps prevent blood clots.  You may return to work once you are authorized by your doctor.     WEIGHT BEARING   Weight bearing as tolerated with assist device (walker, cane, etc) as directed, use it as long as suggested by your surgeon or therapist, typically at least 4-6 weeks.   EXERCISES  Results after joint replacement surgery are often greatly improved  when you follow the exercise, range of motion and muscle strengthening exercises prescribed by your doctor. Safety measures are also important to protect the joint from further injury. Any time any of these exercises cause you to have increased pain or swelling, decrease what you are doing until you are comfortable again and then slowly increase them. If you have problems or questions, call your caregiver or physical therapist for advice.   Rehabilitation is important following a joint replacement. After just a few days of immobilization, the muscles of the leg can become weakened and shrink (atrophy).  These exercises are designed to build up the tone and strength of the thigh and leg muscles and to improve motion. Often times heat used for twenty to thirty minutes before working out will loosen up your tissues and help with improving the range of motion but do not use heat for the first two weeks following surgery (sometimes heat can increase post-operative swelling).   These exercises can be done on a training (exercise) mat, on the floor, on a table or on a bed. Use whatever works the best and is most comfortable for you.    Use music or television while you are exercising so that the exercises are a pleasant break in your day. This will make your life better with the exercises acting as a break in your routine that you can look forward to.   Perform all exercises about fifteen times, three times per day or as directed.  You should exercise both the operative leg and the other leg as well.  Exercises include:   Quad Sets - Tighten up the muscle on the front of the thigh (Quad) and hold for 5-10 seconds.   Straight Leg Raises - With your knee straight (if you were given a brace, keep it on), lift the leg to 60 degrees, hold for 3 seconds, and slowly lower the leg.  Perform this exercise against resistance later as your leg gets stronger.  Leg Slides: Lying on your back, slowly slide your foot toward  your buttocks, bending your knee up off the floor (only go as far as is comfortable). Then slowly slide your foot back down until your leg is flat on the floor again.  Angel Wings: Lying on your back spread your legs to the side as far apart as you can without causing discomfort.  Hamstring Strength:  Lying on your back, push your heel against the floor with your leg straight by tightening up the muscles of your buttocks.  Repeat, but this time bend your knee to a comfortable angle, and push your heel against the floor.  You may put a pillow under the heel to make it more comfortable if necessary.   A rehabilitation program following joint replacement surgery can speed recovery and prevent re-injury in the future due to weakened muscles. Contact your doctor or a physical therapist for more information on knee rehabilitation.    CONSTIPATION  Constipation is defined medically as fewer than three stools per week and severe constipation as less than one stool per week.  Even if you have a regular bowel pattern at home, your normal regimen is likely to be disrupted due to multiple reasons following surgery.  Combination of anesthesia, postoperative narcotics, change in appetite and fluid intake all can affect your bowels.   YOU MUST use at least one of the following options; they are listed in order of increasing strength  to get the job done.  They are all available over the counter, and you may need to use some, POSSIBLY even all of these options:    Drink plenty of fluids (prune juice may be helpful) and high fiber foods Colace 100 mg by mouth twice a day  Senokot for constipation as directed and as needed Dulcolax (bisacodyl), take with full glass of water  Miralax (polyethylene glycol) once or twice a day as needed.  If you have tried all these things and are unable to have a bowel movement in the first 3-4 days after surgery call either your surgeon or your primary doctor.    If you experience  loose stools or diarrhea, hold the medications until you stool forms back up.  If your symptoms do not get better within 1 week or if they get worse, check with your doctor.  If you experience "the worst abdominal pain ever" or develop nausea or vomiting, please contact the office immediately for further recommendations for treatment.   ITCHING:  If you experience itching with your medications, try taking only a single pain pill, or even half a pain pill at a time.  You can also use Benadryl over the counter for itching or also to help with sleep.   TED HOSE STOCKINGS:  Use stockings on both legs until for at least 2 weeks or as directed by physician office. They may be removed at night for sleeping.  MEDICATIONS:  See your medication summary on the "After Visit Summary" that nursing will review with you.  You may have some home medications which will be placed on hold until you complete the course of blood thinner medication.  It is important for you to complete the blood thinner medication as prescribed.  Blood clot prevention (DVT Prophylaxis): After surgery you are at an increased risk for a blood clot. you were prescribed a blood thinner, Aspirin '81mg'$ , to be taken twice daily for a total of 4 weeks from surgery to help reduce your risk of getting a blood clot. This will help prevent a blood clot. Signs of a pulmonary embolus (blood clot in the lungs) include sudden short of breath, feeling lightheaded or dizzy, chest pain with a deep breath, rapid pulse rapid breathing. Signs of a blood clot in your arms or legs include new unexplained swelling and cramping, warm, red or darkened skin around the painful area. Please call the office or 911 right away if these signs or symptoms develop.  PRECAUTIONS:  If you experience chest pain or shortness of breath - call 911 immediately for transfer to the hospital emergency department.   If you develop a fever greater that 101 F, purulent drainage from wound,  increased redness or drainage from wound, foul odor from the wound/dressing, or calf pain - CONTACT YOUR SURGEON.                                                   FOLLOW-UP APPOINTMENTS:  If you do not already have a post-op appointment, please call the office for an appointment to be seen by your surgeon.  Guidelines for how soon to be seen are listed in your "After Visit Summary", but are typically between 2-3 weeks after surgery.  OTHER INSTRUCTIONS:   POST-OPERATIVE OPIOID TAPER INSTRUCTIONS: It is important to wean off of your opioid medication  as soon as possible. If you do not need pain medication after your surgery it is ok to stop day one. Opioids include: Codeine, Hydrocodone(Norco, Vicodin), Oxycodone(Percocet, oxycontin) and hydromorphone amongst others.  Long term and even short term use of opiods can cause: Increased pain response Dependence Constipation Depression Respiratory depression And more.  Withdrawal symptoms can include Flu like symptoms Nausea, vomiting And more Techniques to manage these symptoms Hydrate well Eat regular healthy meals Stay active Use relaxation techniques(deep breathing, meditating, yoga) Do Not substitute Alcohol to help with tapering If you have been on opioids for less than two weeks and do not have pain than it is ok to stop all together.  Plan to wean off of opioids This plan should start within one week post op of your joint replacement. Maintain the same interval or time between taking each dose and first decrease the dose.  Cut the total daily intake of opioids by one tablet each day Next start to increase the time between doses. The last dose that should be eliminated is the evening dose.   MAKE SURE YOU:  Understand these instructions.  Get help right away if you are not doing well or get worse.    Thank you for letting us be a part of your medical care team.  It is a privilege we respect greatly.  We hope these  instructions will help you stay on track for a fast and full recovery!           Signed: Ioan Landini A Volney Reierson 04/29/2022, 6:30 AM

## 2023-09-29 ENCOUNTER — Ambulatory Visit (INDEPENDENT_AMBULATORY_CARE_PROVIDER_SITE_OTHER): Admitting: Sports Medicine

## 2023-09-29 VITALS — Ht 62.0 in | Wt 142.0 lb

## 2023-09-29 DIAGNOSIS — M79671 Pain in right foot: Secondary | ICD-10-CM

## 2023-09-29 NOTE — Addendum Note (Signed)
 Addended by: Rodgers Clack on: 09/29/2023 05:37 PM   Modules accepted: Level of Service

## 2023-09-29 NOTE — Progress Notes (Signed)
   PCP: Jacqueline Dallas Given, MD  SUBJECTIVE:   HPI:  Patient is a 83 y.o. female here for custom orthotics, referred by Dr. Lanis Nash.  Jacqueline Nash has had chronic pain in her feet, worse in the right foot at the second toe. She has h/o bilateral hallux valgus and had a surgery to correct this years ago in the right foot. 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  Pertinent ROS were reviewed with the patient and found to be negative unless otherwise specified above in HPI.   PERTINENT  PMH / PSH / FH / SH:  Past Medical, Surgical, Social, and Family History Reviewed & Updated in the EMR.  Pertinent findings include:  See HPI  Allergies  Allergen Reactions   Sulfa Antibiotics Hives   Demerol [Meperidine] Hives    OBJECTIVE:  Ht 5' 2 (1.575 m)   Wt 142 lb (64.4 kg)   BMI 25.97 kg/m   PHYSICAL EXAM:  GEN: Alert and Oriented, NAD, comfortable in exam room RESP: Unlabored respirations, symmetric chest rise PSY: normal mood, congruent affect   BILATERAL FOOT MSK EXAM: Mild right ankle swelling. No redness. Well preserved long arches. Collapse of transverse arches.  Severe hallux valgus on the right with evidence of previous surgical scars. Moderate hallux valgus on the left. She does have moderate cross-over of her second toe over the great toe on the right with some maceration of the skin.  She is TTP along the 2nd toe.  She is also TTP along the peroneal tendons down to the base of the 5th MT- minimal bony TTP.   Assessment & Plan Right foot pain Secondary to hallux valgus with 2nd toe cross over and peroneal brevis tendinitis. Orthotics made as below and toe spacer provided. Ambulated and noted comfort. F/u as needed, has f/u with Jacqueline Nash next week.  Patient  was fitted for a: standard, cushioned, semi-rigid orthotic. The orthotic was heated and afterward the patient stood on the orthotic blank positioned on the orthotic stand. The patient was positioned in subtalar neutral position and 10 degrees of ankle dorsiflexion in a weight bearing stance on the heated orthotic blank. After completion of molding, a stable base was applied to the orthotic blank. The blank was ground to a stable position for weight bearing. Size: 8 Base: [employer] Posting: Lateral heel wedge on the right Additional orthotic padding: Toe spacer between right 1-2nd toes.   Jacqueline Rend, MD PGY-4, Sports Medicine Fellow [facility]

## 2023-09-29 NOTE — Patient Instructions (Addendum)
 You had custom orthotics made. Test these out for the next week and let Dr. Agatha Horsfall know at your follow-up how they are feeling.   Use the toe spacer I provided you use for the cross-over of the first and second toes on the right foot.
